# Patient Record
Sex: Male | Born: 1935 | Race: White | Hispanic: No | Marital: Married | Smoking: Former smoker
Health system: Southern US, Community
[De-identification: ages and names within clinical notes are randomized; demographics above are authoritative.]

## PROBLEM LIST (undated history)

## (undated) DIAGNOSIS — I10 Essential (primary) hypertension: Secondary | ICD-10-CM

## (undated) DIAGNOSIS — I480 Paroxysmal atrial fibrillation: Secondary | ICD-10-CM

## (undated) DIAGNOSIS — Z86010 Personal history of colonic polyps: Secondary | ICD-10-CM

## (undated) DIAGNOSIS — F419 Anxiety disorder, unspecified: Secondary | ICD-10-CM

## (undated) DIAGNOSIS — K635 Polyp of colon: Secondary | ICD-10-CM

## (undated) DIAGNOSIS — E785 Hyperlipidemia, unspecified: Secondary | ICD-10-CM

## (undated) DIAGNOSIS — K5790 Diverticulosis of intestine, part unspecified, without perforation or abscess without bleeding: Secondary | ICD-10-CM

## (undated) HISTORY — PX: INGUINAL HERNIA REPAIR: SHX194

## (undated) HISTORY — PX: COLONOSCOPY: SHX174

## (undated) HISTORY — DX: Paroxysmal atrial fibrillation: I48.0

## (undated) HISTORY — DX: Diverticulosis of intestine, part unspecified, without perforation or abscess without bleeding: K57.90

## (undated) HISTORY — DX: Anxiety disorder, unspecified: F41.9

## (undated) HISTORY — DX: Personal history of colonic polyps: Z86.010

## (undated) HISTORY — DX: Hyperlipidemia, unspecified: E78.5

## (undated) HISTORY — DX: Essential (primary) hypertension: I10

## (undated) HISTORY — DX: Polyp of colon: K63.5

---

## 1998-07-31 ENCOUNTER — Ambulatory Visit (HOSPITAL_COMMUNITY): Admission: RE | Admit: 1998-07-31 | Discharge: 1998-07-31 | Payer: Self-pay | Admitting: General Surgery

## 2003-11-11 DIAGNOSIS — Z860100 Personal history of colon polyps, unspecified: Secondary | ICD-10-CM

## 2003-11-11 HISTORY — DX: Personal history of colonic polyps: Z86.010

## 2003-11-11 HISTORY — DX: Personal history of colon polyps, unspecified: Z86.0100

## 2004-01-19 ENCOUNTER — Ambulatory Visit: Payer: Self-pay | Admitting: Internal Medicine

## 2004-07-19 ENCOUNTER — Ambulatory Visit: Payer: Self-pay | Admitting: Internal Medicine

## 2004-10-20 ENCOUNTER — Ambulatory Visit: Payer: Self-pay | Admitting: Internal Medicine

## 2004-11-03 ENCOUNTER — Ambulatory Visit: Payer: Self-pay | Admitting: Internal Medicine

## 2005-04-19 ENCOUNTER — Ambulatory Visit: Payer: Self-pay | Admitting: Internal Medicine

## 2005-06-03 ENCOUNTER — Ambulatory Visit: Payer: Self-pay | Admitting: Internal Medicine

## 2005-06-10 ENCOUNTER — Encounter: Payer: Self-pay | Admitting: Internal Medicine

## 2005-06-10 ENCOUNTER — Ambulatory Visit: Payer: Self-pay

## 2005-06-20 ENCOUNTER — Ambulatory Visit: Payer: Self-pay | Admitting: Internal Medicine

## 2006-01-11 ENCOUNTER — Ambulatory Visit: Payer: Self-pay | Admitting: Internal Medicine

## 2006-02-20 ENCOUNTER — Ambulatory Visit: Payer: Self-pay | Admitting: Internal Medicine

## 2006-03-03 ENCOUNTER — Ambulatory Visit: Payer: Self-pay | Admitting: Internal Medicine

## 2006-03-06 ENCOUNTER — Encounter: Admission: RE | Admit: 2006-03-06 | Discharge: 2006-03-06 | Payer: Self-pay | Admitting: Internal Medicine

## 2006-04-12 ENCOUNTER — Ambulatory Visit: Payer: Self-pay | Admitting: Internal Medicine

## 2006-10-25 ENCOUNTER — Ambulatory Visit: Payer: Self-pay | Admitting: Internal Medicine

## 2006-10-25 DIAGNOSIS — I10 Essential (primary) hypertension: Secondary | ICD-10-CM | POA: Insufficient documentation

## 2006-10-25 DIAGNOSIS — E785 Hyperlipidemia, unspecified: Secondary | ICD-10-CM

## 2006-10-25 DIAGNOSIS — M109 Gout, unspecified: Secondary | ICD-10-CM | POA: Insufficient documentation

## 2007-03-28 ENCOUNTER — Ambulatory Visit: Payer: Self-pay | Admitting: Internal Medicine

## 2007-04-11 ENCOUNTER — Encounter: Payer: Self-pay | Admitting: Internal Medicine

## 2007-04-11 ENCOUNTER — Ambulatory Visit: Payer: Self-pay | Admitting: Internal Medicine

## 2007-05-30 ENCOUNTER — Encounter: Payer: Self-pay | Admitting: Internal Medicine

## 2007-06-12 ENCOUNTER — Ambulatory Visit: Payer: Self-pay | Admitting: Internal Medicine

## 2007-10-16 ENCOUNTER — Ambulatory Visit: Payer: Self-pay | Admitting: Internal Medicine

## 2007-11-27 ENCOUNTER — Ambulatory Visit: Payer: Self-pay | Admitting: Internal Medicine

## 2007-11-27 DIAGNOSIS — F411 Generalized anxiety disorder: Secondary | ICD-10-CM

## 2008-06-17 ENCOUNTER — Ambulatory Visit: Payer: Self-pay | Admitting: Internal Medicine

## 2008-06-26 ENCOUNTER — Encounter: Payer: Self-pay | Admitting: Internal Medicine

## 2008-11-04 ENCOUNTER — Ambulatory Visit: Payer: Self-pay | Admitting: Internal Medicine

## 2008-11-25 ENCOUNTER — Telehealth: Payer: Self-pay | Admitting: Internal Medicine

## 2008-12-31 ENCOUNTER — Ambulatory Visit: Payer: Self-pay | Admitting: Internal Medicine

## 2009-06-19 ENCOUNTER — Encounter: Payer: Self-pay | Admitting: Internal Medicine

## 2009-07-03 ENCOUNTER — Ambulatory Visit: Payer: Self-pay | Admitting: Internal Medicine

## 2009-11-06 ENCOUNTER — Ambulatory Visit: Payer: Self-pay | Admitting: Internal Medicine

## 2010-03-02 NOTE — Assessment & Plan Note (Signed)
Summary: 6 MONTH ROV/NJR/PT [facility] FROM BMP/CJR   Vital Signs:  Patient profile:   75 year old male Height:      70 inches Weight:      208 pounds BMI:     29.95 Temp:     98.6 degrees F oral BP sitting:   140 / 80  (right arm) CC: 6 Mths FU , Hypertension Management   CC:  6 Mths FU  and Hypertension Management.  History of Present Illness:  Follow-Up Visit      This is a 75 year old man who presents for Follow-up visit.  The patient denies chest pain and palpitations.  Since the last visit the patient notes no new problems or concerns.  The patient reports taking meds as prescribed and not monitoring BP.  When questioned about possible medication side effects, the patient notes none.  States he feels well. 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   All other systems reviewed and were negative   Hypertension History:      He denies headache, chest pain, palpitations, dyspnea with exertion, peripheral edema, visual symptoms, neurologic problems, syncope, and side effects from treatment.  He notes no problems with any antihypertensive medication side effects.        Positive major cardiovascular risk factors include male age 73 years old or older, hyperlipidemia, and hypertension.  Negative major cardiovascular risk factors include non-tobacco-user status.     Current Problems (verified): 1)  Anxiety  (ICD-300.00) 2)  Preventive Health Care  (ICD-V70.0) 3)  Colonic Polyps, Adenomatous, Hx of  (ICD-V12.72) 4)  Hypertension  (ICD-401.9) 5)  Hyperlipidemia  (ICD-272.4) 6)  Gout  (ICD-274.9)  Current Medications (verified): 1)  Allopurinol 300 Mg Tabs (Allopurinol) .... Take 1 Tablet By Mouth Once A Day 2)  Atenolol 50 Mg Tabs (Atenolol) .Marland Kitchen.. 1 By Mouth 2 Times Daily 3)  Sertraline Hcl 50 Mg Tabs (Sertraline Hcl) .Marland Kitchen.. 1 By Mouth Daily  Allergies (verified): No Known Drug Allergies  Past  History:  Past Medical History: Last updated: 11/27/2007 Gout Hyperlipidemia Hypertension Hx of adenomatous colon polyps Diverticulosis Colonic polyps, hx of Anxiety  Past Surgical History: Last updated: 10/25/2006 Inguinal herniorrhaphy  Family History: Last updated: 01-Jul-2007 mother deceased oat cell ca lung deceased 61 father deceased melanoma  Social History: Last updated: 01-Jul-2007 Married Former Smoker Alcohol use-yes  Risk Factors: Alcohol Use: 2 (07/01/2007)  Risk Factors: Smoking Status: quit > 6 months (12/31/2008)  Physical Exam  General:  Well-developed,well-nourished,in no acute distress; alert,appropriate and cooperative throughout examination Head:  normocephalic and atraumatic.   Eyes:  pupils equal and pupils round.   Ears:  R ear normal and L ear normal.   Neck:  No deformities, masses, or tenderness noted. Chest Wall:  No deformities, masses, tenderness or gynecomastia noted. Lungs:  Normal respiratory effort, chest expands symmetrically. Lungs are clear to auscultation, no crackles or wheezes. Heart:  normal rate and regular rhythm.   Abdomen:  Bowel sounds positive,abdomen soft and non-tender without masses, organomegaly or hernias noted. Msk:  No deformity or scoliosis noted of thoracic or lumbar spine.   Neurologic:  cranial nerves II-XII intact and gait normal.     Impression & Recommendations:  Problem # 1:  HYPERTENSION (ICD-401.9) repeat BP: 132/76 His updated medication list for this problem includes:    Atenolol 50 Mg Tabs (Atenolol) .Marland Kitchen... 1 by mouth  2 times daily  BP today: 140/80 Prior BP: 116/78 (12/31/2008)  10 Yr Risk Heart Disease: Not enough information  Problem # 2:  HYPERLIPIDEMIA (ICD-272.4)  reviewed outside labs chol: 193 LDL 114  Problem # 3:  GOUT (ICD-274.9) no recurrence continue current medications  His updated medication list for this problem includes:    Allopurinol 300 Mg Tabs (Allopurinol) .Marland Kitchen...  Take 1 tablet by mouth once a day I have encouraged him to take a short nap during the day I don't think his subjective complaint of fatigue warrants further eval at this time  Complete Medication List: 1)  Allopurinol 300 Mg Tabs (Allopurinol) .... Take 1 tablet by mouth once a day 2)  Atenolol 50 Mg Tabs (Atenolol) .Marland Kitchen.. 1 by mouth 2 times daily 3)  Sertraline Hcl 50 Mg Tabs (Sertraline hcl) .Marland Kitchen.. 1 by mouth daily  Hypertension Assessment/Plan:      The patient's hypertensive risk group is category B: At least one risk factor (excluding diabetes) with no target organ damage.  Today's blood pressure is 140/80.

## 2010-03-02 NOTE — Assessment & Plan Note (Signed)
Summary: 4 month rov/njr   Vital Signs:  Patient profile:   75 year old male Weight:      202 pounds Temp:     99.0 degrees F oral BP sitting:   160 / 92  (right arm) Cuff size:   large  Vitals Entered By: Sid Falcon LPN (November 06, 2009 11:51 AM)  Serial Vital Signs/Assessments:  Time      Position  BP       Pulse  Resp  Temp     By                     138/82                         Birdie Sons MD   History of Present Illness:  Follow-Up Visit      This is a 75 year old man who presents for Follow-up visit.  The patient denies chest pain and palpitations.  Since the last visit the patient notes no new problems or concerns.  The patient reports taking meds as prescribed.  When questioned about possible medication side effects, the patient notes none.   allopurinol: tolerating well would like to decrease dose because hard to break 300mg  in 1/2  All other systems reviewed and were negative   Current Problems (verified): 1)  Anxiety  (ICD-300.00) 2)  Preventive Health Care  (ICD-V70.0) 3)  Colonic Polyps, Adenomatous, Hx of  (ICD-V12.72) 4)  Hypertension  (ICD-401.9) 5)  Hyperlipidemia  (ICD-272.4) 6)  Gout  (ICD-274.9)  Current Medications (verified): 1)  Allopurinol 300 Mg Tabs (Allopurinol) .... Take 1/2 Tablet By Mouth Once A Day 2)  Atenolol 50 Mg Tabs (Atenolol) .Marland Kitchen.. 1 By Mouth 2 Times Daily 3)  Sertraline Hcl 50 Mg Tabs (Sertraline Hcl) .Marland Kitchen.. 1 By Mouth Daily  Allergies (verified): No Known Drug Allergies  Past History:  Past Medical History: Last updated: 11/27/2007 Gout Hyperlipidemia Hypertension Hx of adenomatous colon polyps Diverticulosis Colonic polyps, hx of Anxiety  Past Surgical History: Last updated: 10/25/2006 Inguinal herniorrhaphy  Family History: Last updated: 26-Jun-2007 mother deceased oat cell ca lung deceased 52 father deceased melanoma  Social History: Last updated: 2007/06/26 Married Former Smoker Alcohol use-yes  Risk  Factors: Alcohol Use: 2 (2007/06/26)  Risk Factors: Smoking Status: quit > 6 months (12/31/2008)  Physical Exam  General:  alert and well-developed.   Head:  normocephalic and atraumatic.   Eyes:  pupils equal and pupils round.   Ears:  R ear normal and L ear normal.   Neck:  No deformities, masses, or tenderness noted. Chest Wall:  No deformities, masses, tenderness or gynecomastia noted. Lungs:  no intercostal retractions and no accessory muscle use.   Heart:  normal rate and regular rhythm.   Msk:  No deformity or scoliosis noted of thoracic or lumbar spine.   Neurologic:  cranial nerves II-XII intact and gait normal.   Skin:  turgor normal and color normal.   Psych:  normally interactive and good eye contact.     Impression & Recommendations:  Problem # 1:  HYPERLIPIDEMIA (ICD-272.4)  reviewed labs from [facility]  Problem # 2:  HYPERTENSION (ICD-401.9) he will follow at home His updated medication list for this problem includes:    Atenolol 50 Mg Tabs (Atenolol) .Marland Kitchen... 1 by mouth 2 times daily  BP today: 160/92 Prior BP: 140/80 (07/03/2009)  Prior 10 Yr Risk Heart Disease: Not enough information (07/03/2009)  Problem # 3:  GOUT (ICD-274.9) decrease dose call for increased sxs The following medications were removed from the medication list:    Allopurinol 300 Mg Tabs (Allopurinol) .Marland Kitchen... Take 1/2 tablet by mouth once a day His updated medication list for this problem includes:    Allopurinol 100 Mg Tabs (Allopurinol) .Marland Kitchen... 1 by mouth daily  Complete Medication List: 1)  Atenolol 50 Mg Tabs (Atenolol) .Marland Kitchen.. 1 by mouth 2 times daily 2)  Sertraline Hcl 50 Mg Tabs (Sertraline hcl) .Marland Kitchen.. 1 by mouth daily 3)  Allopurinol 100 Mg Tabs (Allopurinol) .Marland Kitchen.. 1 by mouth daily  Other Orders: Flu Vaccine 37yrs + MEDICARE PATIENTS (O9629) Administration Flu vaccine - MCR (B2841)  Patient Instructions: 1)  Please schedule a follow-up appointment in 6 months. Prescriptions: SERTRALINE  HCL 50 MG TABS (SERTRALINE HCL) 1 by mouth daily  #90 Tablet x 3   Entered and Authorized by:   Birdie Sons MD   Signed by:   Birdie Sons MD on 11/06/2009   Method used:   Electronically to        [facility]* (retail)       [address] [place], [place]  [address]       Ph: [phone]       Fax: [phone]   RxID:   [id number] ATENOLOL 50 MG TABS (ATENOLOL) 1 by mouth 2 times daily  #180 x 3   Entered and Authorized by:   Birdie Sons MD   Signed by:   Birdie Sons MD on 11/06/2009   Method used:   Electronically to        [facility]* (retail)       [address] [place], [place]  [address]       Ph: [phone]       Fax: [phone]   RxID:   [id number] ALLOPURINOL 100 MG TABS (ALLOPURINOL) 1 by mouth daily  #90 x 3   Entered and Authorized by:   Birdie Sons MD   Signed by:   Birdie Sons MD on 11/06/2009   Method used:   Electronically to        [facility]* (retail)       [address] [place], [place]  [address]       Ph: [phone]       Fax: [phone]   RxID:   [id number]   Flu Vaccine Consent Questions     Do you have a history of severe allergic reactions to this vaccine? no    Any prior history of allergic reactions to egg and/or gelatin? no    Do you have a sensitivity to the preservative Thimersol? no    Do you have a past history of Guillan-Barre Syndrome? no    Do you currently have an acute febrile illness? no    Have you ever had a severe reaction to latex? no    Vaccine information given and explained to patient? yes    Are you currently pregnant? no    Lot Number:[id number]   Exp Date:07/31/2010   Site Given  Left Deltoid IMdflu

## 2010-04-20 ENCOUNTER — Encounter: Payer: Self-pay | Admitting: Internal Medicine

## 2010-04-29 NOTE — Letter (Signed)
Summary: Colonoscopy Letter  [facility]  [address]   [place], [place] [address]   Phone: [phone]  Fax: [phone]      April 20, 2010 MRN: [id number]   Travis Pruitt [address] [place], [place]  [address]   Dear [name],   According to your medical record, it is time for you to schedule a Colonoscopy. The American Cancer Society recommends this procedure as a method to detect early colon cancer. Patients with a family history of colon cancer, or a personal history of colon polyps or inflammatory bowel disease are at increased risk.  This letter has been generated based on the recommendations made at the time of your procedure. If you feel that in your particular situation this may no longer apply, please contact our office.  Please call our office at [phone] to schedule this appointment or to update your records at your earliest convenience.  Thank you for cooperating with [place] to provide you with the very best care possible.   Sincerely,   Stan Head, M.D.  [facility] Gastroenterology [facility] [phone]

## 2010-05-20 ENCOUNTER — Other Ambulatory Visit: Payer: Self-pay | Admitting: Dermatology

## 2010-06-01 ENCOUNTER — Ambulatory Visit: Payer: Self-pay | Admitting: Internal Medicine

## 2010-06-15 NOTE — Assessment & Plan Note (Signed)
[facility]                         GASTROENTEROLOGY OFFICE NOTE   MASATO, PETTIE                 MRN:          [id number]  DATE:03/28/2007                            DOB:          13-Apr-1935    CHIEF COMPLAINT:  Change in bowels.   Mr. Cotta has been a once-a-day bowel movement person for years.  Over the past several months, it has gone to two, sometimes three a day,  smaller amounts, sometimes loose.  No bleeding reported.  No fever or  chills or other constitutional symptoms.  No medications, no diet  change.  Occasionally, his hemorrhoids still protrude and bother him.   PAST MEDICAL HISTORY:  Adenomatous colon polyps, October 2005, with  diverticulosis.  External hemorrhoids diagnosed in 2007.  Hypertension,  gout.   MEDICATIONS:  1. Allopurinol 300 mg daily.  2. Lisinopril 25 mg daily.   ALLERGIES:  There are no known drug allergies.   PHYSICAL EXAM:  Weight 213 pounds, pulse 60, blood pressure 140/80.   SOCIAL HISTORY:  He is not a smoker.  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   ASSESSMENT:  Change in bowels.  Could represent new colorectal  neoplasia.  He has a personal history of adenomatous polyps, as well.   PLAN:  Schedule colonoscopy to investigate the change in bowel habits,  keeping in mind the history of adenomatous polyps.     Iva Boop, MD,FACG  Electronically Signed    CEG/MedQ  DD: 03/28/2007  DT: 03/28/2007  Job #: [id number]   cc:   Valetta Mole. Swords, MD

## 2010-06-18 NOTE — Assessment & Plan Note (Signed)
[facility]                         GASTROENTEROLOGY OFFICE NOTE   LEIGH, Travis Pruitt                 MRN:          [id number]  DATE:01/11/2006                            DOB:          03-Oct-1935    CHIEF COMPLAINT:  Question hemorrhoids.   REQUESTING PHYSICIAN:  Dr. Birdie Sons.   ASSESSMENT:  Small external hemorrhoids versus protruding internal  hemorrhoids.  These are seen on physical exam.   PLAN:  Supportive care, hemorrhoid handout, preparation H as needed.  He  is reassured.  He was concerned that this represented something more  serious.  He is due for a surveillance colonoscopy for a small  adenomatous polyp, but not until 2010.  See me as needed otherwise.   HISTORY:  Mr. Douthat has noted some protrusion at the rectum as he  is washing around his anal area in the shower.  He is concerned about  this.  There is no bleeding or defecation change.   PAST MEDICAL HISTORY:  Colonoscopy with tubular adenoma x2, 3 mm, 5 mm,  November 26, 2003, as well as diverticulosis.  At that time, I did not  really notice any obvious hemorrhoids.  He also has hypertension.  Full  details of his medical history otherwise outlined in my chart.   PHYSICAL:  Weight 206 pounds, height 5 feet 10 inches, blood pressure  120/88, pulse 60.  Inspection of the rectal area shows 2 small protruding violaceous  hemorrhoids.  They are very small, though.  Not thrombosed.  They are  nontender.  Rectal exam shows no mass, no significant stool.  Prostate  is normal.   Note, I advised him on some anal care as well to try to avoid excessive  wiping, and perhaps use wet wipes.  His medications are listed and  reviewed in the chart.     Iva Boop, MD,FACG  Electronically Signed    CEG/MedQ  DD: 01/11/2006  DT: 01/11/2006  Job #: [id number]   cc:   Valetta Mole. Swords, MD

## 2010-07-05 ENCOUNTER — Encounter: Payer: Self-pay | Admitting: Internal Medicine

## 2010-07-06 ENCOUNTER — Ambulatory Visit: Payer: Self-pay | Admitting: Internal Medicine

## 2010-07-16 ENCOUNTER — Ambulatory Visit: Payer: Self-pay | Admitting: Internal Medicine

## 2010-07-20 ENCOUNTER — Ambulatory Visit: Payer: Self-pay | Admitting: Internal Medicine

## 2010-07-22 ENCOUNTER — Ambulatory Visit (INDEPENDENT_AMBULATORY_CARE_PROVIDER_SITE_OTHER): Payer: 59 | Admitting: Internal Medicine

## 2010-07-22 ENCOUNTER — Encounter: Payer: Self-pay | Admitting: Internal Medicine

## 2010-07-22 DIAGNOSIS — I10 Essential (primary) hypertension: Secondary | ICD-10-CM

## 2010-07-22 DIAGNOSIS — F411 Generalized anxiety disorder: Secondary | ICD-10-CM

## 2010-07-22 DIAGNOSIS — M109 Gout, unspecified: Secondary | ICD-10-CM

## 2010-07-22 DIAGNOSIS — E785 Hyperlipidemia, unspecified: Secondary | ICD-10-CM

## 2010-07-22 NOTE — Assessment & Plan Note (Addendum)
Needs f/u- Reviewed labs from [facility] medical

## 2010-07-22 NOTE — Assessment & Plan Note (Signed)
He is only taking 25 mg of zoloft---ok to stop

## 2010-07-22 NOTE — Assessment & Plan Note (Addendum)
Repeat bp is adequate Continue same meds

## 2010-07-22 NOTE — Assessment & Plan Note (Signed)
No recurrence  Continue meds Reviewed labs from dr Dareen Piano

## 2010-08-01 NOTE — Progress Notes (Signed)
  Subjective:    Patient ID: Travis Pruitt, male    DOB: 12-15-1935, 75 y.o.   MRN: [id number]  HPI Patient Active Problem List  Diagnoses  . HYPERLIPIDEMIA--currently not on any medications.   Marland Kitchen GOUT--no recurrent symptoms.   . ANXIETY--- patient with a presumed chronic anxiety disorder. He does not desire treatment at this time.   Marland Kitchen HYPERTENSION--- no home blood pressures. He is tolerating atenolol.   . COLONIC POLYPS, ADENOMATOUS, HX OF    Past Medical History  Diagnosis Date  . Gout   . Hyperlipidemia   . Hypertension   . Anxiety   . Diverticulosis   . Colon polyp    Past Surgical History  Procedure Date  . Inguinal hernia repair     reports that he quit smoking about 40 years ago. He does not have any smokeless tobacco history on file. He reports that he drinks alcohol. His drug history not on file. family history includes Cancer in his mother. No Known Allergies     Review of Systems patient denies chest pain, shortness of breath, orthopnea. Denies lower extremity edema, abdominal pain, change in appetite, change in bowel movements. Patient denies rashes, musculoskeletal complaints. No other specific complaints in a complete review of systems.       Objective:   Physical Exam  well-developed well-nourished male in no acute distress. HEENT exam atraumatic, normocephalic, neck supple without jugular venous distention. Chest clear to auscultation cardiac exam S1-S2 are regular. Abdominal exam overweight with bowel sounds, soft and nontender. Extremities no edema. Neurologic exam is alert with a normal gait.        Assessment & Plan:

## 2010-11-12 ENCOUNTER — Ambulatory Visit: Payer: 59

## 2010-11-27 ENCOUNTER — Other Ambulatory Visit: Payer: Self-pay | Admitting: Internal Medicine

## 2010-12-31 ENCOUNTER — Ambulatory Visit: Payer: 59 | Admitting: Internal Medicine

## 2011-01-05 ENCOUNTER — Ambulatory Visit (INDEPENDENT_AMBULATORY_CARE_PROVIDER_SITE_OTHER): Payer: 59 | Admitting: Internal Medicine

## 2011-01-05 ENCOUNTER — Encounter: Payer: Self-pay | Admitting: Internal Medicine

## 2011-01-05 VITALS — BP 128/84 | HR 60 | Temp 98.3°F | Ht 70.0 in | Wt 203.0 lb

## 2011-01-05 DIAGNOSIS — Z23 Encounter for immunization: Secondary | ICD-10-CM

## 2011-01-05 DIAGNOSIS — I10 Essential (primary) hypertension: Secondary | ICD-10-CM

## 2011-01-05 DIAGNOSIS — Z0000 Encounter for general adult medical examination without abnormal findings: Secondary | ICD-10-CM

## 2011-01-05 LAB — LIPID PANEL
Cholesterol: 185 mg/dL (ref 0–200)
HDL: 45.8 mg/dL (ref >39.00)
LDL Cholesterol: 109 mg/dL — ABNORMAL HIGH (ref 0–99)
Total CHOL/HDL Ratio: 4
Triglycerides: 149 mg/dL (ref 0.0–149.0)
VLDL: 29.8 mg/dL (ref 0.0–40.0)

## 2011-01-05 LAB — CBC WITH DIFFERENTIAL/PLATELET
Basophils Absolute: 0 10*3/uL (ref 0.0–0.1)
Basophils Relative: 0.2 % (ref 0.0–3.0)
Eosinophils Absolute: 0.1 10*3/uL (ref 0.0–0.7)
Lymphs Abs: 1.6 10*3/uL (ref 0.7–4.0)
MCV: 95 fl (ref 78.0–100.0)
Neutro Abs: 6.1 10*3/uL (ref 1.4–7.7)
Neutrophils Relative %: 72.6 % (ref 43.0–77.0)
RDW: 13.2 % (ref 11.5–14.6)
WBC: 8.5 10*3/uL (ref 4.5–10.5)

## 2011-01-05 LAB — HEPATIC FUNCTION PANEL
AST: 17 U/L (ref 0–37)
Albumin: 3.8 g/dL (ref 3.5–5.2)
Alkaline Phosphatase: 49 U/L (ref 39–117)
Total Bilirubin: 0.7 mg/dL (ref 0.3–1.2)
Total Protein: 6.6 g/dL (ref 6.0–8.3)

## 2011-01-05 LAB — BASIC METABOLIC PANEL
Calcium: 8.9 mg/dL (ref 8.4–10.5)
Chloride: 104 mEq/L (ref 96–112)
Potassium: 4.5 mEq/L (ref 3.5–5.1)

## 2011-01-05 MED ORDER — SERTRALINE HCL 50 MG PO TABS: 50.0000 mg | ORAL_TABLET | Freq: Every day | ORAL | Status: DC

## 2011-01-05 NOTE — Assessment & Plan Note (Signed)
BP Readings from Last 3 Encounters:  01/05/11 128/84  07/22/10 132/84  11/06/09 160/92   Reasonable control Continue same meds

## 2011-01-05 NOTE — Progress Notes (Signed)
Patient ID: Travis Pruitt, male   DOB: 1935/10/22, 75 y.o.   MRN: [id number] cpx  Past Medical History  Diagnosis Date  . Gout   . Hyperlipidemia   . Hypertension   . Anxiety   . Diverticulosis   . Colon polyp     History   Social History  . Marital Status: Married    Spouse Name: N/A    Number of Children: N/A  . Years of Education: N/A   Occupational History  . Not on file.   Social History Main Topics  . Smoking status: Former Smoker    Quit date: 07/22/1970  . Smokeless tobacco: Not on file  . Alcohol Use: Yes  . Drug Use: Not on file  . Sexually Active: Not on file   Other Topics Concern  . Not on file   Social History Narrative  . No narrative on file    Past Surgical History  Procedure Date  . Inguinal hernia repair     Family History  Problem Relation Age of Onset  . Cancer Mother     lung    No Known Allergies  Current Outpatient Prescriptions on File Prior to Visit  Medication Sig Dispense Refill  . allopurinol (ZYLOPRIM) 100 MG tablet TAKE 1 TABLET BY MOUTH DAILY  90 tablet  2  . atenolol (TENORMIN) 50 MG tablet TAKE 1 TABLET BY MOUTH TWICE DAILY  180 tablet  2     patient denies chest pain, shortness of breath, orthopnea. Denies lower extremity edema, abdominal pain, change in appetite, change in bowel movements. Patient denies rashes, musculoskeletal complaints. No other specific complaints in a complete review of systems.   BP 142/90  Pulse 60  Temp(Src) 98.3 F (36.8 C) (Oral)  Ht 5\' 10"  (1.778 m)  Wt 203 lb (92.08 kg)  BMI 29.13 kg/m2  well-developed well-nourished male in no acute distress. HEENT exam atraumatic, normocephalic, neck supple without jugular venous distention. Chest clear to auscultation cardiac exam S1-S2 are regular. Abdominal exam overweight with bowel sounds, soft and nontender. Extremities no edema. Neurologic exam is alert with a normal gait.  A/P--- Well visit--health maint UTD

## 2011-01-07 ENCOUNTER — Ambulatory Visit: Payer: 59 | Admitting: Internal Medicine

## 2011-02-14 ENCOUNTER — Other Ambulatory Visit: Payer: Self-pay | Admitting: Dermatology

## 2011-03-15 ENCOUNTER — Other Ambulatory Visit: Payer: Self-pay | Admitting: Dermatology

## 2011-10-19 ENCOUNTER — Encounter: Payer: Self-pay | Admitting: Internal Medicine

## 2011-12-17 ENCOUNTER — Other Ambulatory Visit: Payer: Self-pay | Admitting: Internal Medicine

## 2011-12-20 ENCOUNTER — Other Ambulatory Visit: Payer: Self-pay | Admitting: *Deleted

## 2011-12-20 MED ORDER — ALLOPURINOL 100 MG PO TABS: 100.0000 mg | ORAL_TABLET | Freq: Every day | ORAL | Status: DC

## 2011-12-20 MED ORDER — ATENOLOL 50 MG PO TABS: 50.0000 mg | ORAL_TABLET | Freq: Two times a day (BID) | ORAL | Status: DC

## 2012-06-04 ENCOUNTER — Telehealth: Payer: Self-pay | Admitting: Internal Medicine

## 2012-06-04 MED ORDER — ATENOLOL 50 MG PO TABS: 50.0000 mg | ORAL_TABLET | Freq: Two times a day (BID) | ORAL | Status: DC

## 2012-06-04 NOTE — Telephone Encounter (Signed)
Pt has appt 08/13/12.  90 days rx sent in electronically

## 2012-06-04 NOTE — Telephone Encounter (Signed)
Patient called stating that he need a refill of his atenolol 50 mg 1pobid appt scheduled [facility]. Please assist.

## 2012-06-05 ENCOUNTER — Other Ambulatory Visit (INDEPENDENT_AMBULATORY_CARE_PROVIDER_SITE_OTHER): Payer: 59

## 2012-06-05 DIAGNOSIS — Z0000 Encounter for general adult medical examination without abnormal findings: Secondary | ICD-10-CM

## 2012-06-05 LAB — BASIC METABOLIC PANEL
CO2: 28 mEq/L (ref 19–32)
Chloride: 102 mEq/L (ref 96–112)
Creatinine, Ser: 1.1 mg/dL (ref 0.4–1.5)
Glucose, Bld: 85 mg/dL (ref 70–99)
Sodium: 137 mEq/L (ref 135–145)

## 2012-06-05 LAB — LIPID PANEL
LDL Cholesterol: 114 mg/dL — ABNORMAL HIGH (ref 0–99)
Total CHOL/HDL Ratio: 4
Triglycerides: 151 mg/dL — ABNORMAL HIGH (ref 0.0–149.0)
VLDL: 30.2 mg/dL (ref 0.0–40.0)

## 2012-06-05 LAB — PSA: PSA: 0.22 ng/mL (ref 0.10–4.00)

## 2012-06-05 LAB — CBC WITH DIFFERENTIAL/PLATELET
Basophils Absolute: 0 10*3/uL (ref 0.0–0.1)
Hemoglobin: 17.3 g/dL — ABNORMAL HIGH (ref 13.0–17.0)
Lymphs Abs: 1.7 10*3/uL (ref 0.7–4.0)
Neutro Abs: 4.3 10*3/uL (ref 1.4–7.7)
Neutrophils Relative %: 64.4 % (ref 43.0–77.0)
Platelets: 176 10*3/uL (ref 150.0–400.0)
RDW: 13.4 % (ref 11.5–14.6)

## 2012-06-07 LAB — POCT URINALYSIS DIPSTICK
Ketones, UA: NEGATIVE
Leukocytes, UA: NEGATIVE
Urobilinogen, UA: 0.2
pH, UA: 7

## 2012-06-11 ENCOUNTER — Other Ambulatory Visit: Payer: Self-pay | Admitting: Dermatology

## 2012-07-02 ENCOUNTER — Other Ambulatory Visit: Payer: 59

## 2012-07-09 ENCOUNTER — Encounter: Payer: 59 | Admitting: Internal Medicine

## 2012-08-13 ENCOUNTER — Ambulatory Visit (INDEPENDENT_AMBULATORY_CARE_PROVIDER_SITE_OTHER): Payer: 59 | Admitting: Internal Medicine

## 2012-08-13 ENCOUNTER — Encounter: Payer: Self-pay | Admitting: Internal Medicine

## 2012-08-13 VITALS — BP 142/88 | HR 68 | Temp 98.3°F | Ht 70.25 in | Wt 204.5 lb

## 2012-08-13 DIAGNOSIS — Z0000 Encounter for general adult medical examination without abnormal findings: Secondary | ICD-10-CM

## 2012-08-13 MED ORDER — ALLOPURINOL 100 MG PO TABS: 100.0000 mg | ORAL_TABLET | Freq: Every day | ORAL | Status: DC

## 2012-08-13 NOTE — Progress Notes (Signed)
cpx  Past Medical History  Diagnosis Date  . Gout   . Hyperlipidemia   . Hypertension   . Anxiety   . Diverticulosis   . Colon polyp     History   Social History  . Marital Status: Married    Spouse Name: N/A    Number of Children: N/A  . Years of Education: N/A   Occupational History  . Not on file.   Social History Main Topics  . Smoking status: Former Smoker    Quit date: 07/22/1970  . Smokeless tobacco: Not on file  . Alcohol Use: Yes  . Drug Use: Not on file  . Sexually Active: Not on file   Other Topics Concern  . Not on file   Social History Narrative  . No narrative on file    Past Surgical History  Procedure Laterality Date  . Inguinal hernia repair      Family History  Problem Relation Age of Onset  . Cancer Mother     lung    No Known Allergies  Current Outpatient Prescriptions on File Prior to Visit  Medication Sig Dispense Refill  . allopurinol (ZYLOPRIM) 100 MG tablet Take 1 tablet (100 mg total) by mouth daily. NEEDS OV  30 tablet  0  . atenolol (TENORMIN) 50 MG tablet Take 1 tablet (50 mg total) by mouth 2 (two) times daily. NEEDS OV  180 tablet  0  . sertraline (ZOLOFT) 50 MG tablet Take 1 tablet (50 mg total) by mouth daily. Or as directed  90 tablet  2   No current facility-administered medications on file prior to visit.     patient denies chest pain, shortness of breath, orthopnea. Denies lower extremity edema, abdominal pain, change in appetite, change in bowel movements. Patient denies rashes, musculoskeletal complaints. No other specific complaints in a complete review of systems.   Reviewed vitals  well-developed well-nourished male in no acute distress. HEENT exam atraumatic, normocephalic, neck supple without jugular venous distention. Chest clear to auscultation cardiac exam S1-S2 are regular. Abdominal exam overweight with bowel sounds, soft and nontender. Extremities no edema. Neurologic exam is alert with a normal  gait.  Well Visit- health maint UTD Reviewed labs from May

## 2012-08-21 ENCOUNTER — Other Ambulatory Visit: Payer: Self-pay | Admitting: *Deleted

## 2012-08-21 MED ORDER — ATENOLOL 50 MG PO TABS: 50.0000 mg | ORAL_TABLET | Freq: Two times a day (BID) | ORAL | Status: DC

## 2012-11-07 ENCOUNTER — Ambulatory Visit (INDEPENDENT_AMBULATORY_CARE_PROVIDER_SITE_OTHER): Payer: 59

## 2012-11-07 DIAGNOSIS — Z23 Encounter for immunization: Secondary | ICD-10-CM

## 2013-06-10 ENCOUNTER — Other Ambulatory Visit: Payer: Self-pay | Admitting: Dermatology

## 2013-07-30 ENCOUNTER — Telehealth: Payer: Self-pay | Admitting: Internal Medicine

## 2013-07-30 MED ORDER — ATENOLOL 50 MG PO TABS: 50.0000 mg | ORAL_TABLET | Freq: Two times a day (BID) | ORAL | Status: DC

## 2013-07-30 NOTE — Telephone Encounter (Signed)
rx sent in electronically, needs ov before anymore refilled

## 2013-07-30 NOTE — Telephone Encounter (Signed)
Pt is needing new rx atenolol (TENORMIN) 50 MG tablet, send to [facility]

## 2013-08-01 ENCOUNTER — Telehealth: Payer: Self-pay | Admitting: Internal Medicine

## 2013-08-01 MED ORDER — ATENOLOL 50 MG PO TABS: 50.0000 mg | ORAL_TABLET | Freq: Two times a day (BID) | ORAL | Status: DC

## 2013-08-01 NOTE — Telephone Encounter (Signed)
Pt request refill atenolol (TENORMIN) 50 MG tablet  90 day Pt had fup appt [facility] was cancelled by the dr [facility]

## 2013-08-01 NOTE — Telephone Encounter (Signed)
rx sent to pharmacy

## 2013-08-07 ENCOUNTER — Other Ambulatory Visit (INDEPENDENT_AMBULATORY_CARE_PROVIDER_SITE_OTHER): Payer: 59

## 2013-08-07 DIAGNOSIS — Z0000 Encounter for general adult medical examination without abnormal findings: Secondary | ICD-10-CM

## 2013-08-07 LAB — POCT URINALYSIS DIPSTICK
BILIRUBIN UA: NEGATIVE
GLUCOSE UA: NEGATIVE
KETONES UA: NEGATIVE
LEUKOCYTES UA: NEGATIVE
NITRITE UA: NEGATIVE
PROTEIN UA: NEGATIVE
Spec Grav, UA: 1.02
Urobilinogen, UA: 0.2
pH, UA: 6

## 2013-08-07 LAB — BASIC METABOLIC PANEL
BUN: 22 mg/dL (ref 6–23)
CO2: 28 mEq/L (ref 19–32)
CREATININE: 0.9 mg/dL (ref 0.4–1.5)
Calcium: 9.1 mg/dL (ref 8.4–10.5)
Chloride: 104 mEq/L (ref 96–112)
GFR: 84.06 mL/min (ref >60.00)
Glucose, Bld: 92 mg/dL (ref 70–99)
Potassium: 4.3 mEq/L (ref 3.5–5.1)
Sodium: 139 mEq/L (ref 135–145)

## 2013-08-07 LAB — LIPID PANEL
Cholesterol: 195 mg/dL (ref 0–200)
HDL: 44.2 mg/dL (ref >39.00)
LDL CALC: 116 mg/dL — AB (ref 0–99)
NonHDL: 150.8
TRIGLYCERIDES: 175 mg/dL — AB (ref 0.0–149.0)
Total CHOL/HDL Ratio: 4
VLDL: 35 mg/dL (ref 0.0–40.0)

## 2013-08-07 LAB — CBC WITH DIFFERENTIAL/PLATELET
BASOS ABS: 0 10*3/uL (ref 0.0–0.1)
BASOS PCT: 0.4 % (ref 0.0–3.0)
EOS ABS: 0.2 10*3/uL (ref 0.0–0.7)
EOS PCT: 2.8 % (ref 0.0–5.0)
HCT: 49.3 % (ref 39.0–52.0)
Hemoglobin: 16.9 g/dL (ref 13.0–17.0)
LYMPHS ABS: 1.6 10*3/uL (ref 0.7–4.0)
LYMPHS PCT: 27.6 % (ref 12.0–46.0)
MCHC: 34.2 g/dL (ref 30.0–36.0)
MCV: 93.9 fl (ref 78.0–100.0)
Monocytes Absolute: 0.5 10*3/uL (ref 0.1–1.0)
Monocytes Relative: 9.1 % (ref 3.0–12.0)
Neutro Abs: 3.5 10*3/uL (ref 1.4–7.7)
Neutrophils Relative %: 60.1 % (ref 43.0–77.0)
Platelets: 177 10*3/uL (ref 150.0–400.0)
RBC: 5.25 Mil/uL (ref 4.22–5.81)
RDW: 13.3 % (ref 11.5–15.5)
WBC: 5.8 10*3/uL (ref 4.0–10.5)

## 2013-08-07 LAB — HEPATIC FUNCTION PANEL
ALT: 15 U/L (ref 0–53)
AST: 14 U/L (ref 0–37)
Albumin: 3.7 g/dL (ref 3.5–5.2)
Alkaline Phosphatase: 50 U/L (ref 39–117)
BILIRUBIN DIRECT: 0.2 mg/dL (ref 0.0–0.3)
BILIRUBIN TOTAL: 0.8 mg/dL (ref 0.2–1.2)
Total Protein: 6.1 g/dL (ref 6.0–8.3)

## 2013-08-07 LAB — PSA: PSA: 0.26 ng/mL (ref 0.10–4.00)

## 2013-08-07 LAB — TSH: TSH: 1.12 u[IU]/mL (ref 0.35–4.50)

## 2013-08-13 ENCOUNTER — Other Ambulatory Visit: Payer: 59

## 2013-08-14 ENCOUNTER — Ambulatory Visit (INDEPENDENT_AMBULATORY_CARE_PROVIDER_SITE_OTHER): Payer: 59 | Admitting: Physician Assistant

## 2013-08-14 ENCOUNTER — Encounter: Payer: 59 | Admitting: Internal Medicine

## 2013-08-14 ENCOUNTER — Encounter: Payer: Self-pay | Admitting: Physician Assistant

## 2013-08-14 VITALS — BP 128/80 | HR 60 | Temp 98.0°F | Resp 18 | Ht 69.5 in | Wt 205.0 lb

## 2013-08-14 DIAGNOSIS — Z0000 Encounter for general adult medical examination without abnormal findings: Secondary | ICD-10-CM

## 2013-08-14 NOTE — Patient Instructions (Signed)
You are in very good health.   Continued healthy diet and regular exercise can help bring down your cholesterol.  Return as needed for all other concerns.   Health Maintenance A healthy lifestyle and preventative care can promote health and wellness.  Maintain regular health, dental, and eye exams.  Eat a healthy diet. Foods like vegetables, fruits, whole grains, low-fat dairy products, and lean protein foods contain the nutrients you need and are low in calories. Decrease your intake of foods high in solid fats, added sugars, and salt. Get information about a proper diet from your health care provider, if necessary.  Regular physical exercise is one of the most important things you can do for your health. Most adults should get at least 150 minutes of moderate-intensity exercise (any activity that increases your heart rate and causes you to sweat) each week. In addition, most adults need muscle-strengthening exercises on 2 or more days a week.   Maintain a healthy weight. The body mass index (BMI) is a screening tool to identify possible weight problems. It provides an estimate of body fat based on height and weight. Your health care provider can find your BMI and can help you achieve or maintain a healthy weight. For males 20 years and older:  A BMI below 18.5 is considered underweight.  A BMI of 18.5 to 24.9 is normal.  A BMI of 25 to 29.9 is considered overweight.  A BMI of 30 and above is considered obese.  Maintain normal blood lipids and cholesterol by exercising and minimizing your intake of saturated fat. Eat a balanced diet with plenty of fruits and vegetables. Blood tests for lipids and cholesterol should begin at age 34 and be repeated every 5 years. If your lipid or cholesterol levels are high, you are over age 66, or you are at high risk for heart disease, you may need your cholesterol levels checked more frequently.Ongoing high lipid and cholesterol levels should be treated  with medicines if diet and exercise are not working.  If you smoke, find out from your health care provider how to quit. If you do not use tobacco, do not start.  Lung cancer screening is recommended for adults aged 47-80 years who are at high risk for developing lung cancer because of a history of smoking. A yearly low-dose CT scan of the lungs is recommended for people who have at least a 30-pack-year history of smoking and are current smokers or have quit within the past 15 years. A pack year of smoking is smoking an average of 1 pack of cigarettes a day for 1 year (for example, a 30-pack-year history of smoking could mean smoking 1 pack a day for 30 years or 2 packs a day for 15 years). Yearly screening should continue until the smoker has stopped smoking for at least 15 years. Yearly screening should be stopped for people who develop a health problem that would prevent them from having lung cancer treatment.  If you choose to drink alcohol, do not have more than 2 drinks per day. One drink is considered to be 12 oz (360 mL) of beer, 5 oz (150 mL) of wine, or 1.5 oz (45 mL) of liquor.  Avoid the use of street drugs. Do not share needles with anyone. Ask for help if you need support or instructions about stopping the use of drugs.  High blood pressure causes heart disease and increases the risk of stroke. Blood pressure should be checked at least every 1-2 years.  Ongoing high blood pressure should be treated with medicines if weight loss and exercise are not effective.  If you are 72-64 years old, ask your health care provider if you should take aspirin to prevent heart disease.  Diabetes screening involves taking a blood sample to check your fasting blood sugar level. This should be done once every 3 years after age 36 if you are at a normal weight and without risk factors for diabetes. Testing should be considered at a younger age or be carried out more frequently if you are overweight and have at  least 1 risk factor for diabetes.  Colorectal cancer can be detected and often prevented. Most routine colorectal cancer screening begins at the age of 42 and continues through age 86. However, your health care provider may recommend screening at an earlier age if you have risk factors for colon cancer. On a yearly basis, your health care provider may provide home test kits to check for hidden blood in the stool. A small camera at the end of a tube may be used to directly examine the colon (sigmoidoscopy or colonoscopy) to detect the earliest forms of colorectal cancer. Talk to your health care provider about this at age 49 when routine screening begins. A direct exam of the colon should be repeated every 5-10 years through age 25, unless early forms of precancerous polyps or small growths are found.  People who are at an increased risk for hepatitis B should be screened for this virus. You are considered at high risk for hepatitis B if:  You were born in a country where hepatitis B occurs often. Talk with your health care provider about which countries are considered high risk.  Your parents were born in a high-risk country and you have not received a shot to protect against hepatitis B (hepatitis B vaccine).  You have HIV or AIDS.  You use needles to inject street drugs.  You live with, or have sex with, someone who has hepatitis B.  You are a man who has sex with other men (MSM).  You get hemodialysis treatment.  You take certain medicines for conditions like cancer, organ transplantation, and autoimmune conditions.  Hepatitis C blood testing is recommended for all people born from [birth year] through 1963 and any individual with known risk factors for hepatitis C.  Healthy men should no longer receive prostate-specific antigen (PSA) blood tests as part of routine cancer screening. Talk to your health care provider about prostate cancer screening.  Testicular cancer screening is not recommended  for adolescents or adult males who have no symptoms. Screening includes self-exam, a health care provider exam, and other screening tests. Consult with your health care provider about any symptoms you have or any concerns you have about testicular cancer.  Practice safe sex. Use condoms and avoid high-risk sexual practices to reduce the spread of sexually transmitted infections (STIs).  You should be screened for STIs, including gonorrhea and chlamydia if:  You are sexually active and are younger than 24 years.  You are older than 24 years, and your health care provider tells you that you are at risk for this type of infection.  Your sexual activity has changed since you were last screened, and you are at an increased risk for chlamydia or gonorrhea. Ask your health care provider if you are at risk.  If you are at risk of being infected with HIV, it is recommended that you take a prescription medicine daily to prevent HIV infection. This  is called pre-exposure prophylaxis (PrEP). You are considered at risk if:  You are a man who has sex with other men (MSM).  You are a heterosexual man who is sexually active with multiple partners.  You take drugs by injection.  You are sexually active with a partner who has HIV.  Talk with your health care provider about whether you are at high risk of being infected with HIV. If you choose to begin PrEP, you should first be tested for HIV. You should then be tested every 3 months for as long as you are taking PrEP.  Use sunscreen. Apply sunscreen liberally and repeatedly throughout the day. You should seek shade when your shadow is shorter than you. Protect yourself by wearing long sleeves, pants, a wide-brimmed hat, and sunglasses year round whenever you are outdoors.  Tell your health care provider of new moles or changes in moles, especially if there is a change in shape or color. Also, tell your health care provider if a mole is larger than the size  of a pencil eraser.  A one-time screening for abdominal aortic aneurysm (AAA) and surgical repair of large AAAs by ultrasound is recommended for men aged 31-75 years who are current or former smokers.  Stay current with your vaccines (immunizations). Document Released: 07/16/2007 Document Revised: 01/22/2013 Document Reviewed: 06/14/2010 [facility] Patient Information 2015 [place], [place]. This information is not intended to replace advice given to you by your health care provider. Make sure you discuss any questions you have with your health care provider.

## 2013-08-14 NOTE — Progress Notes (Signed)
Pre visit review using our clinic review tool, if applicable. No additional management support is needed unless otherwise documented below in the visit note. 

## 2013-08-14 NOTE — Progress Notes (Signed)
Subjective:    Patient ID: Travis Pruitt, male    DOB: January 01, 1936, 78 y.o.   MRN: [id number]  HPI Patient presents to clinic today for annual physical.   Health Maintenance: Dental -- Dentist is off [place], last Dr. Audrie Gallus. Vision -- Sees optometrist for Rx glasses, still working fine. Immunizations -- Up to date Colonoscopy -- UTD until 2019   Review of Systems  Constitutional: Negative for fever, chills, diaphoresis, activity change, appetite change, fatigue and unexpected weight change.  HENT: Negative.   Eyes: Negative.   Respiratory: Negative for apnea, cough, choking, chest tightness, shortness of breath, wheezing and stridor.   Cardiovascular: Negative for chest pain, palpitations and leg swelling.  Gastrointestinal: Negative.  Negative for nausea, vomiting, abdominal pain, diarrhea and blood in stool.  Endocrine: Negative.   Genitourinary: Negative.  Negative for dysuria, urgency and frequency.  Musculoskeletal: Negative.  Negative for arthralgias, back pain and joint swelling.  Skin: Negative.  Negative for color change, pallor, rash and wound.  Allergic/Immunologic: Negative.   Neurological: Negative.  Negative for syncope, light-headedness, numbness and headaches.  Hematological: Negative.   Psychiatric/Behavioral: Negative.  Negative for suicidal ideas, hallucinations, behavioral problems, confusion, sleep disturbance, self-injury and agitation. The patient is not nervous/anxious.   All other systems reviewed and are negative.      Past Medical History  Diagnosis Date  . Gout   . Hyperlipidemia   . Hypertension   . Anxiety   . Diverticulosis   . Colon polyp   . COLONIC POLYPS, ADENOMATOUS, HX OF 11/11/2003    Annotation: COLONOSCOPY 10/05 ([facility]) COLONOSCOPY 04/11/07 Qualifier: Diagnosis of  By: Carlean Purl MD, Dimas Millin     History   Social History  . Marital Status: Married    Spouse Name: N/A    Number of Children: N/A  . Years of Education:  N/A   Occupational History  . Not on file.   Social History Main Topics  . Smoking status: Former Smoker    Quit date: 07/22/1970  . Smokeless tobacco: Not on file  . Alcohol Use: Yes  . Drug Use: Not on file  . Sexual Activity: Not on file   Other Topics Concern  . Not on file   Social History Narrative  . No narrative on file    Past Surgical History  Procedure Laterality Date  . Inguinal hernia repair      Family History  Problem Relation Age of Onset  . Cancer Mother     lung    No Known Allergies  Current Outpatient Prescriptions on File Prior to Visit  Medication Sig Dispense Refill  . allopurinol (ZYLOPRIM) 100 MG tablet Take 1 tablet (100 mg total) by mouth daily.  90 tablet  3  . atenolol (TENORMIN) 50 MG tablet Take 1 tablet (50 mg total) by mouth 2 (two) times daily.  180 tablet  0  . clindamycin (CLEOCIN) 150 MG capsule Take 1 capsule by mouth 3 (three) times daily.       No current facility-administered medications on file prior to visit.   The PFS history was reviewed with the pt at time of visit.  EXAM: BP 128/80  Pulse 60  Temp(Src) 98 F (36.7 C) (Oral)  Resp 18  Ht 5' 9.5" (1.765 m)  Wt 205 lb (92.987 kg)  BMI 29.85 kg/m2     Objective:   Physical Exam  Nursing note and vitals reviewed. Constitutional: He is oriented to person, place, and time. He appears  well-developed and well-nourished. No distress.  HENT:  Head: Normocephalic and atraumatic.  Right Ear: External ear normal.  Left Ear: External ear normal.  Nose: Nose normal.  Mouth/Throat: Oropharynx is clear and moist. No oropharyngeal exudate.  Bilat TMs normal.  Eyes: Conjunctivae and EOM are normal. Pupils are equal, round, and reactive to light. Right eye exhibits no discharge. Left eye exhibits no discharge. No scleral icterus.  Neck: Normal range of motion. Neck supple. No JVD present. No tracheal deviation present. No thyromegaly present.  Cardiovascular: Normal rate,  regular rhythm, normal heart sounds and intact distal pulses.  Exam reveals no gallop and no friction rub.   No murmur heard. Pulmonary/Chest: Effort normal and breath sounds normal. No stridor. No respiratory distress. He has no wheezes. He has no rales. He exhibits no tenderness.  Abdominal: Soft. Bowel sounds are normal. He exhibits no distension and no mass. There is no tenderness. There is no rebound and no guarding.  Genitourinary:  Pt Declined.  Musculoskeletal: Normal range of motion. He exhibits no edema and no tenderness.  Lymphadenopathy:    He has no cervical adenopathy.  Neurological: He is alert and oriented to person, place, and time. He has normal reflexes. No cranial nerve deficit. He exhibits normal muscle tone. Coordination normal.  Skin: Skin is warm and dry. No rash noted. He is not diaphoretic. No erythema. No pallor.  Psychiatric: He has a normal mood and affect. His behavior is normal. Judgment and thought content normal.    Lab Results  Component Value Date   WBC 5.8 08/07/2013   HGB 16.9 08/07/2013   HCT 49.3 08/07/2013   PLT 177.0 08/07/2013   GLUCOSE 92 08/07/2013   CHOL 195 08/07/2013   TRIG 175.0* 08/07/2013   HDL 44.20 08/07/2013   LDLCALC 116* 08/07/2013   ALT 15 08/07/2013   AST 14 08/07/2013   NA 139 08/07/2013   K 4.3 08/07/2013   CL 104 08/07/2013   CREATININE 0.9 08/07/2013   BUN 22 08/07/2013   CO2 28 08/07/2013   TSH 1.12 08/07/2013   PSA 0.26 08/07/2013         Assessment & Plan:  [name] was seen today for annual exam.  Diagnoses and associated orders for this visit:  Preventative health care Comments: Over all healthy. Reviewed recent lab results. Cholesterol slightly elevated, discussed healthy dieat and regular exercise, pt will try this.    Pt has had slightly elevated cholesterol for years and has managed without statin therapy. Discussed diet modifications and getting regular exercise. Pt wishes to try this before considering medication.  Return  precautions provided, and patient handout on health maintenance.  Plan to follow up as needed for all other concerns.  Patient Instructions  You are in very good health.   Continued healthy diet and regular exercise can help bring down your cholesterol.  Return as needed for all other concerns.

## 2013-09-26 ENCOUNTER — Telehealth: Payer: Self-pay | Admitting: Internal Medicine

## 2013-09-26 MED ORDER — ALLOPURINOL 100 MG PO TABS: 100.0000 mg | ORAL_TABLET | Freq: Every day | ORAL | Status: DC

## 2013-09-26 MED ORDER — ATENOLOL 50 MG PO TABS: 50.0000 mg | ORAL_TABLET | Freq: Two times a day (BID) | ORAL | Status: DC

## 2013-09-26 NOTE — Telephone Encounter (Signed)
Pt request refill allopurinol (ZYLOPRIM) 100 MG tablet   90 day w/ 4 refills  atenolol (TENORMIN) 50 MG tablet 90ay w/ 4 refills  pt states these were not done at his appt on 7/15 [facility]

## 2013-09-26 NOTE — Telephone Encounter (Signed)
Rx sent to pharmacy   

## 2013-11-05 ENCOUNTER — Encounter: Payer: Self-pay | Admitting: Family Medicine

## 2013-11-05 ENCOUNTER — Ambulatory Visit (INDEPENDENT_AMBULATORY_CARE_PROVIDER_SITE_OTHER): Payer: 59 | Admitting: Family Medicine

## 2013-11-05 VITALS — BP 130/90 | Temp 98.6°F | Wt 202.0 lb

## 2013-11-05 DIAGNOSIS — Z23 Encounter for immunization: Secondary | ICD-10-CM

## 2013-11-05 DIAGNOSIS — R5383 Other fatigue: Secondary | ICD-10-CM

## 2013-11-05 NOTE — Progress Notes (Signed)
  Garret Reddish, MD Phone: [phone]  Subjective:   Travis Pruitt is a 78 y.o. year old very pleasant male patient who presents with the following:  Fatigue X 2 years. Perhaps mild worsening over last few months but gradual.   Very busy. About to start a new business. Needs all the energy possible to get through this transition. [employer].   Former professional athlete-skiing/hockey. No current exercise.   PHQ 2 of 0   ROS- no chest pain or shortness of breath. No night sweats. No unintentional weight loss. Occasional snoring but no daytime sleepiness. No night sweats.   Past Medical History- Patient Active Problem List   Diagnosis Date Noted  . ANXIETY 11/27/2007  . HYPERLIPIDEMIA 10/25/2006  . GOUT 10/25/2006  . HYPERTENSION 10/25/2006   Medications- reviewed and updated Current Outpatient Prescriptions  Medication Sig Dispense Refill  . allopurinol (ZYLOPRIM) 100 MG tablet Take 1 tablet (100 mg total) by mouth daily.  90 tablet  3  . atenolol (TENORMIN) 50 MG tablet Take 1 tablet (50 mg total) by mouth 2 (two) times daily.  180 tablet  3   No current facility-administered medications for this visit.    Objective: BP 130/90  Temp(Src) 98.6 F (37 C)  Wt 202 lb (91.627 kg) Gen: NAD, resting comfortably CV: RRR no murmurs rubs or gallops Lungs: CTAB no crackles, wheeze, rhonchi Abdomen: soft/nontender/nondistended/normal bowel sounds. No rebound or guarding.  Ext: no edema Skin: warm, dry Neuro: grossly normal, moves all extremities  Assessment/Plan:  Fatigue Doubt cardiac-no chest pain or shortness of breath, primarily just lack of "pep in step". No cough/congestion to suggest lung disease. Does have history of smoking many years ago but think CXR low yield. Up to date on cancer screening-prostate and colon. Recent labwork unremarkable for cause and patient was tired/fatigued at time of that visit. Slight snoring but not regular issue and no  daytime sleepiness. Offerred vitamin D testing but patient declines for now. Atenolol can cause fatigue but has been on over 10 years. ONly real issue is no exercise in 30 years. Patient to start with light amount such as 5 minutes walking 3x a week and build to 150 minutes per week. To see me back in march and sooner if new symptoms appear.   Increased exercise should also help with mild increase in DBP.

## 2013-11-05 NOTE — Patient Instructions (Addendum)
Reviewed labs and history. No clear cause for fatigue other than not exercising currently. Would advise to start with perhaps 5-10 minutes of walking 3x a week with slow increase until you reach 150 minutes per week. Would want you to stop and see me if you have chest pain or shortness of breath.   Follow up in 6 months. Could consider getting vitamin D levels or changing your blood pressure medicine to see if that helps.   Health Maintenance Due  Topic Date Due  . Influenza Vaccine  08/31/2013

## 2014-02-18 ENCOUNTER — Other Ambulatory Visit: Payer: Self-pay | Admitting: Dermatology

## 2014-06-18 ENCOUNTER — Ambulatory Visit (INDEPENDENT_AMBULATORY_CARE_PROVIDER_SITE_OTHER): Payer: 59 | Admitting: Family Medicine

## 2014-06-18 ENCOUNTER — Encounter: Payer: Self-pay | Admitting: Family Medicine

## 2014-06-18 VITALS — BP 148/82 | HR 49 | Temp 98.3°F | Wt 196.0 lb

## 2014-06-18 DIAGNOSIS — F411 Generalized anxiety disorder: Secondary | ICD-10-CM

## 2014-06-18 DIAGNOSIS — I10 Essential (primary) hypertension: Secondary | ICD-10-CM | POA: Diagnosis not present

## 2014-06-18 DIAGNOSIS — Z23 Encounter for immunization: Secondary | ICD-10-CM | POA: Diagnosis not present

## 2014-06-18 DIAGNOSIS — M109 Gout, unspecified: Secondary | ICD-10-CM

## 2014-06-18 MED ORDER — SERTRALINE HCL 50 MG PO TABS: 50.0000 mg | ORAL_TABLET | Freq: Every day | ORAL | Status: DC

## 2014-06-18 NOTE — Patient Instructions (Addendum)
Received final pnuemonia shot today ([id number]).  Restart zoloft. See [place] immediately if any thoughts of hurting yourself.   Blood pressure slightly high today. Let's monitor one more time, may need to give you some assistance with medications but hopefully not as you continue exercise/weight loss

## 2014-06-18 NOTE — Progress Notes (Signed)
Travis Reddish, MD  Subjective:  Travis Pruitt is a 79 y.o. year old very pleasant male patient who presents with:  See problem oriented charting -walking 2-3x a week. Weight down 6 lbs.  ROS- no chest pain, shortness of breath, dizziness  Past Medical History- hyperlipidemia  Medications- reviewed and updated Current Outpatient Prescriptions  Medication Sig Dispense Refill  . allopurinol (ZYLOPRIM) 100 MG tablet Take 1 tablet (100 mg total) by mouth daily. 90 tablet 3  . atenolol (TENORMIN) 50 MG tablet Take 1 tablet (50 mg total) by mouth 2 (two) times daily. 180 tablet 3   No current facility-administered medications for this visit.    Objective: BP 148/82 mmHg  Pulse 49  Temp(Src) 98.3 F (36.8 C)  Wt 196 lb (88.905 kg) Gen: NAD, resting comfortably CV: not bradycardic on my exam, no murmurs rubs or gallops Lungs: CTAB no crackles, wheeze, rhonchi Abdomen: soft/nontender/nondistended/normal bowel sounds. No rebound or guarding.  Ext: no edema Skin: warm, dry   Assessment/Plan:  Essential hypertension S: worsened control systolic, improved diastolic with 6 lb weight loss, more activity A/P: technically at goal per [facility]. Plan to monitor, consider increased rx such as amlodipine if consistently above 145/or 90    Gout S: no flares in last year on allopurinol A/P: continue current rx, consider uric acid level next visit    Generalized anxiety disorder S: suspect GAD as cause of anxiety that was previously improved on zoloft. Patient requests to restart. 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 Discussed SI risk which I believe is low. Follow up 6 months.     October follow up.   Orders Placed This Encounter  Procedures  . Pneumococcal conjugate vaccine 13-valent   Meds ordered this encounter  Medications  . sertraline (ZOLOFT) 50 MG tablet    Sig: Take 1 tablet (50 mg total) by mouth  daily.    Dispense:  90 tablet    Refill:  3

## 2014-06-18 NOTE — Assessment & Plan Note (Addendum)
S: suspect GAD as cause of anxiety that was previously improved on zoloft. Patient requests to restart. 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 Discussed SI risk which I believe is low. Follow up 6 months.

## 2014-06-18 NOTE — Assessment & Plan Note (Signed)
S: no flares in last year on allopurinol A/P: continue current rx, consider uric acid level next visit

## 2014-06-18 NOTE — Assessment & Plan Note (Signed)
S: worsened control systolic, improved diastolic with 6 lb weight loss, more activity A/P: technically at goal per [facility]. Plan to monitor, consider increased rx such as amlodipine if consistently above 145/or 90

## 2014-07-25 ENCOUNTER — Telehealth: Payer: Self-pay | Admitting: Family Medicine

## 2014-07-25 MED ORDER — ATENOLOL 50 MG PO TABS: 50.0000 mg | ORAL_TABLET | Freq: Two times a day (BID) | ORAL | Status: DC

## 2014-07-25 NOTE — Telephone Encounter (Signed)
Medication refilled

## 2014-07-25 NOTE — Telephone Encounter (Signed)
Refill request for Atenolol 50 mg & Allopurinol 100 mg and send to [facility].

## 2014-09-22 ENCOUNTER — Other Ambulatory Visit: Payer: Self-pay | Admitting: *Deleted

## 2014-09-22 MED ORDER — ALLOPURINOL 100 MG PO TABS: 100.0000 mg | ORAL_TABLET | Freq: Every day | ORAL | Status: DC

## 2014-10-27 ENCOUNTER — Other Ambulatory Visit: Payer: 59

## 2014-10-28 ENCOUNTER — Other Ambulatory Visit: Payer: 59

## 2014-10-29 ENCOUNTER — Other Ambulatory Visit (INDEPENDENT_AMBULATORY_CARE_PROVIDER_SITE_OTHER): Payer: 59

## 2014-10-29 DIAGNOSIS — Z0000 Encounter for general adult medical examination without abnormal findings: Secondary | ICD-10-CM | POA: Diagnosis not present

## 2014-10-29 LAB — CBC WITH DIFFERENTIAL/PLATELET
BASOS PCT: 0.5 % (ref 0.0–3.0)
Basophils Absolute: 0 10*3/uL (ref 0.0–0.1)
EOS PCT: 1.8 % (ref 0.0–5.0)
Eosinophils Absolute: 0.1 10*3/uL (ref 0.0–0.7)
HEMATOCRIT: 50.6 % (ref 39.0–52.0)
HEMOGLOBIN: 17.2 g/dL — AB (ref 13.0–17.0)
LYMPHS PCT: 29 % (ref 12.0–46.0)
Lymphs Abs: 1.9 10*3/uL (ref 0.7–4.0)
MCHC: 33.9 g/dL (ref 30.0–36.0)
MCV: 95 fl (ref 78.0–100.0)
MONOS PCT: 8.3 % (ref 3.0–12.0)
Monocytes Absolute: 0.5 10*3/uL (ref 0.1–1.0)
Neutro Abs: 4 10*3/uL (ref 1.4–7.7)
Neutrophils Relative %: 60.4 % (ref 43.0–77.0)
PLATELETS: 180 10*3/uL (ref 150.0–400.0)
RBC: 5.33 Mil/uL (ref 4.22–5.81)
RDW: 13.6 % (ref 11.5–15.5)
WBC: 6.6 10*3/uL (ref 4.0–10.5)

## 2014-10-29 LAB — BASIC METABOLIC PANEL
BUN: 18 mg/dL (ref 6–23)
CHLORIDE: 105 meq/L (ref 96–112)
CO2: 30 meq/L (ref 19–32)
CREATININE: 0.98 mg/dL (ref 0.40–1.50)
Calcium: 9 mg/dL (ref 8.4–10.5)
GFR: 78.87 mL/min (ref >60.00)
Glucose, Bld: 97 mg/dL (ref 70–99)
POTASSIUM: 4 meq/L (ref 3.5–5.1)
SODIUM: 141 meq/L (ref 135–145)

## 2014-10-29 LAB — LIPID PANEL
CHOLESTEROL: 169 mg/dL (ref 0–200)
HDL: 48.6 mg/dL (ref >39.00)
LDL CALC: 93 mg/dL (ref 0–99)
NONHDL: 120.88
Total CHOL/HDL Ratio: 3
Triglycerides: 139 mg/dL (ref 0.0–149.0)
VLDL: 27.8 mg/dL (ref 0.0–40.0)

## 2014-10-29 LAB — HEPATIC FUNCTION PANEL
ALBUMIN: 3.8 g/dL (ref 3.5–5.2)
ALT: 12 U/L (ref 0–53)
AST: 13 U/L (ref 0–37)
Alkaline Phosphatase: 49 U/L (ref 39–117)
Bilirubin, Direct: 0.1 mg/dL (ref 0.0–0.3)
TOTAL PROTEIN: 6.1 g/dL (ref 6.0–8.3)
Total Bilirubin: 0.7 mg/dL (ref 0.2–1.2)

## 2014-10-29 LAB — PSA: PSA: 0.29 ng/mL (ref 0.10–4.00)

## 2014-10-29 LAB — TSH: TSH: 1.06 u[IU]/mL (ref 0.35–4.50)

## 2014-10-30 LAB — POCT URINALYSIS DIPSTICK
Bilirubin, UA: NEGATIVE
Glucose, UA: NEGATIVE
Ketones, UA: NEGATIVE
Leukocytes, UA: NEGATIVE
Nitrite, UA: NEGATIVE
RBC UA: NEGATIVE
UROBILINOGEN UA: 0.2
pH, UA: 6

## 2014-11-03 ENCOUNTER — Encounter: Payer: Self-pay | Admitting: Family Medicine

## 2014-11-03 ENCOUNTER — Ambulatory Visit (INDEPENDENT_AMBULATORY_CARE_PROVIDER_SITE_OTHER): Payer: 59 | Admitting: Family Medicine

## 2014-11-03 VITALS — BP 160/80 | HR 60 | Temp 98.5°F | Ht 69.0 in | Wt 196.0 lb

## 2014-11-03 DIAGNOSIS — Z23 Encounter for immunization: Secondary | ICD-10-CM | POA: Diagnosis not present

## 2014-11-03 DIAGNOSIS — Z0000 Encounter for general adult medical examination without abnormal findings: Secondary | ICD-10-CM

## 2014-11-03 DIAGNOSIS — Z1211 Encounter for screening for malignant neoplasm of colon: Secondary | ICD-10-CM

## 2014-11-03 DIAGNOSIS — I10 Essential (primary) hypertension: Secondary | ICD-10-CM

## 2014-11-03 NOTE — Progress Notes (Signed)
Travis Reddish, MD Phone: [phone]  Subjective:  Patient presents today for their annual physical. Chief complaint-noted.   -doing well. Not sleeping as well as he would like but falling asleep. Wakes up thinking about his projects. Anxiety improved on zoloft ROS- full  review of systems was completed and negative except for: poor sleep, no recent gout flare, no chest pain or shortness of breath  The following were reviewed and entered/updated in epic: Past Medical History  Diagnosis Date  . Gout   . Hyperlipidemia   . Hypertension   . Anxiety   . Diverticulosis   . Colon polyp   . COLONIC POLYPS, ADENOMATOUS, HX OF 11/11/2003    Annotation: COLONOSCOPY 10/05 ([facility]) COLONOSCOPY 04/11/07 Qualifier: Diagnosis of  By: Carlean Purl MD, Tonna Boehringer E    Patient Active Problem List   Diagnosis Date Noted  . Generalized anxiety disorder 11/27/2007    Priority: Medium  . Hyperlipidemia 10/25/2006    Priority: Medium  . Gout 10/25/2006    Priority: Medium  . Essential hypertension 10/25/2006    Priority: Medium   Past Surgical History  Procedure Laterality Date  . Inguinal hernia repair      Family History  Problem Relation Age of Onset  . Cancer Mother     lung    Medications- reviewed and updated Current Outpatient Prescriptions  Medication Sig Dispense Refill  . allopurinol (ZYLOPRIM) 100 MG tablet Take 1 tablet (100 mg total) by mouth daily. 90 tablet 1  . atenolol (TENORMIN) 50 MG tablet Take 1 tablet (50 mg total) by mouth 2 (two) times daily. 180 tablet 3  . sertraline (ZOLOFT) 50 MG tablet Take 1 tablet (50 mg total) by mouth daily. 90 tablet 3   No current facility-administered medications for this visit.    Allergies-reviewed and updated No Known Allergies  Social History   Social History  . Marital Status: Married    Spouse Name: N/A  . Number of Children: N/A  . Years of Education: N/A   Social History Main Topics  . Smoking status: Former Smoker      Quit date: 07/22/1970  . Smokeless tobacco: None  . Alcohol Use: Yes  . Drug Use: None  . Sexual Activity: Not Asked   Other Topics Concern  . None   Social History Narrative   Married    ROS--See HPI   Objective: BP 160/80 mmHg  Pulse 60  Temp(Src) 98.5 F (36.9 C)  Ht 5\' 9"  (1.753 m)  Wt 196 lb (88.905 kg)  BMI 28.93 kg/m2 Gen: NAD, resting comfortably HEENT: Mucous membranes are moist. Oropharynx normal Neck: no thyromegaly CV: RRR no murmurs rubs or gallops Lungs: CTAB no crackles, wheeze, rhonchi Abdomen: soft/nontender/nondistended/normal bowel sounds. No rebound or guarding.  Rectal: normal tone, rather small prostate, no masses or tenderness  Ext: no edema, 2+ PT pulses Skin: warm, dry, no rash Neuro: grossly normal, moves all extremities, PERRLA  Assessment/Plan:  79 y.o. male presenting for annual physical.  Health Maintenance counseling: 1. Anticipatory guidance: Patient counseled regarding regular dental exams, wearing seatbelts, wearing sunscreen 2. Risk factor reduction:  Advised patient of need for regular exercise and diet rich and fruits and vegetables to reduce risk of heart attack and stroke.  3. Immunizations/screenings/ancillary studies Health Maintenance Due  Topic Date Due  . INFLUENZA VACCINE  09/01/2014  4. Prostate cancer screening- low risk based off PSA and rectal exam 5. Colon cancer screening - 04/11/2007 with adenoma noted - refer at this time,  likely should have had 5 year repeat 6. Skin cancer screening- follows up twice a year with dermatology  History of hyperlipipidemia now controlled without medications (had increased walking) Lab Results  Component Value Date   CHOL 169 10/29/2014   HDL 48.60 10/29/2014   LDLCALC 93 10/29/2014   TRIG 139.0 10/29/2014   CHOLHDL 3 10/29/2014   GAD- improved control  Gout- no flares  Essential hypertension S: poor control and trending up on atenolol 50mg  BID BP Readings from Last 3  Encounters:  11/03/14 160/80  06/18/14 148/82  11/05/13 130/90  A/P: Goal per [facility] of 150/90. We set goal 145/90. Continue home meds, get home cuff. Follow up 4-6 weeks, if elevated above goal- consider amlodipine  4-6 week BP check  Orders Placed This Encounter  Procedures  . Flu Vaccine QUAD 36+ mos IM  . Ambulatory referral to Gastroenterology    Referral Priority:  Routine    Referral Type:  Consultation    Referral Reason:  Specialty Services Required    Number of Visits Requested:  1

## 2014-11-03 NOTE — Assessment & Plan Note (Signed)
S: poor control and trending up on atenolol 50mg  BID BP Readings from Last 3 Encounters:  11/03/14 160/80  06/18/14 148/82  11/05/13 130/90  A/P: Goal per [facility] of 150/90. We set goal 145/90. Continue home meds, get home cuff. Follow up 4-6 weeks, if elevated above goal- consider amlodipine

## 2014-11-03 NOTE — Patient Instructions (Addendum)
Flu shot received today.  [facility] will call you to schedule colonoscopy. We can try again next year if they cannot do it before calendar end You need this given history of precancerous polyp  Blood pressure trending up on top # BP Readings from Last 3 Encounters:  11/03/14 160/80  06/18/14 148/82  11/05/13 130/90  Goal we set was <145/90 See me within 4-6 weeks for repeat Consider getting home cuff, logging pressure daily and bringing cuff with you to visit  Otherwise things look great

## 2014-11-04 ENCOUNTER — Encounter: Payer: Self-pay | Admitting: Internal Medicine

## 2014-11-18 ENCOUNTER — Ambulatory Visit: Payer: 59 | Admitting: Family Medicine

## 2014-12-01 ENCOUNTER — Encounter: Payer: Self-pay | Admitting: Family Medicine

## 2014-12-01 ENCOUNTER — Ambulatory Visit (INDEPENDENT_AMBULATORY_CARE_PROVIDER_SITE_OTHER): Payer: 59 | Admitting: Family Medicine

## 2014-12-01 VITALS — BP 132/74 | HR 57 | Temp 98.5°F | Wt 195.0 lb

## 2014-12-01 DIAGNOSIS — F411 Generalized anxiety disorder: Secondary | ICD-10-CM

## 2014-12-01 DIAGNOSIS — I10 Essential (primary) hypertension: Secondary | ICD-10-CM | POA: Diagnosis not present

## 2014-12-01 NOTE — Assessment & Plan Note (Signed)
S: symptoms tolerable on zoloft 50mg /controlled A/P: continue current dose of zoloft

## 2014-12-01 NOTE — Progress Notes (Signed)
Travis Reddish, MD  Subjective:  Travis Pruitt is a 79 y.o. year old very pleasant male patient who presents for/with See problem oriented charting ROS- No chest pain or shortness of breath. No headache or blurry vision.   Past Medical History-  Patient Active Problem List   Diagnosis Date Noted  . Generalized anxiety disorder 11/27/2007    Priority: Medium  . Hyperlipidemia 10/25/2006    Priority: Medium  . Gout 10/25/2006    Priority: Medium  . Essential hypertension 10/25/2006    Priority: Medium    Medications- reviewed and updated Current Outpatient Prescriptions  Medication Sig Dispense Refill  . allopurinol (ZYLOPRIM) 100 MG tablet Take 1 tablet (100 mg total) by mouth daily. 90 tablet 1  . atenolol (TENORMIN) 50 MG tablet Take 1 tablet (50 mg total) by mouth 2 (two) times daily. 180 tablet 3  . sertraline (ZOLOFT) 50 MG tablet Take 1 tablet (50 mg total) by mouth daily. 90 tablet 3   No current facility-administered medications for this visit.    Objective: BP 132/74 mmHg  Pulse 57  Temp(Src) 98.5 F (36.9 C)  Wt 195 lb (88.451 kg) Gen: NAD, resting comfortably CV: RRR no murmurs rubs or gallops Lungs: CTAB no crackles, wheeze, rhonchi Abdomen: soft/nontender/nondistended/normal bowel sounds.  Ext: no edema Skin: warm, dry Neuro: grossly normal, moves all extremities  Assessment/Plan:  Essential hypertension S: controlled. Much improved from last 2 visits. Patient states stress level much better today- has completed some projects that are important to him. Compliant with Atenolol 50mg  BID. Weight down 1 lb. Plan was to get a home cuff and monitor but has not done that BP Readings from Last 3 Encounters:  12/01/14 132/74  11/03/14 160/80  06/18/14 148/82  A/P:Continue current meds:  Atenolol 50mg  BID.  We discussed home monitoring once again- advised patient to do so. Patient also aware importance of healthy eating and exercise.    Generalized  anxiety disorder S: symptoms tolerable on zoloft 50mg /controlled A/P: continue current dose of zoloft   6 months f/u

## 2014-12-01 NOTE — Patient Instructions (Addendum)
Blood pressure trending up previously, looks better today BP Readings from Last 3 Encounters:  12/01/14 132/74  11/03/14 160/80  06/18/14 148/82  Goal we set was <145/90; <140/90 even better Consider getting home cuff, logging pressure daily and bringing cuff with you to visit  Otherwise things look great

## 2014-12-01 NOTE — Assessment & Plan Note (Signed)
S: controlled. Much improved from last 2 visits. Patient states stress level much better today- has completed some projects that are important to him. Compliant with Atenolol 50mg  BID. Weight down 1 lb. Plan was to get a home cuff and monitor but has not done that BP Readings from Last 3 Encounters:  12/01/14 132/74  11/03/14 160/80  06/18/14 148/82  A/P:Continue current meds:  Atenolol 50mg  BID.  We discussed home monitoring once again- advised patient to do so. Patient also aware importance of healthy eating and exercise.

## 2014-12-19 ENCOUNTER — Other Ambulatory Visit: Payer: Self-pay | Admitting: Family Medicine

## 2014-12-23 ENCOUNTER — Ambulatory Visit (AMBULATORY_SURGERY_CENTER): Payer: Self-pay

## 2014-12-23 VITALS — Ht 70.0 in | Wt 195.0 lb

## 2014-12-23 DIAGNOSIS — Z860100 Personal history of colon polyps, unspecified: Secondary | ICD-10-CM

## 2014-12-23 NOTE — Progress Notes (Signed)
No diet/weight loss meds No home oxygen No allergies to eggs or soy No past problems with anesthesia  Has email and internet; refused emmi

## 2014-12-30 ENCOUNTER — Encounter: Payer: Self-pay | Admitting: Internal Medicine

## 2015-01-06 ENCOUNTER — Ambulatory Visit (AMBULATORY_SURGERY_CENTER): Payer: 59 | Admitting: Internal Medicine

## 2015-01-06 ENCOUNTER — Encounter: Payer: Self-pay | Admitting: Internal Medicine

## 2015-01-06 VITALS — BP 185/90 | HR 53 | Temp 97.0°F | Resp 20 | Ht 70.0 in | Wt 195.0 lb

## 2015-01-06 DIAGNOSIS — D122 Benign neoplasm of ascending colon: Secondary | ICD-10-CM | POA: Diagnosis not present

## 2015-01-06 DIAGNOSIS — D120 Benign neoplasm of cecum: Secondary | ICD-10-CM

## 2015-01-06 DIAGNOSIS — D123 Benign neoplasm of transverse colon: Secondary | ICD-10-CM

## 2015-01-06 DIAGNOSIS — Z86010 Personal history of colonic polyps: Secondary | ICD-10-CM

## 2015-01-06 MED ORDER — SODIUM CHLORIDE 0.9 % IV SOLN: 500.0000 mL | INTRAVENOUS | Status: DC

## 2015-01-06 NOTE — Op Note (Signed)
[place]  [address] [place], [address]   COLONOSCOPY PROCEDURE REPORT  PATIENT: Travis Pruitt, Travis Pruitt  MR#: [id number] BIRTHDATE: [birth year] , 78  yrs. old GENDER: male ENDOSCOPIST: Gatha Mayer, MD, [facility] PROCEDURE DATE:  01/06/2015 PROCEDURE:   Colonoscopy, surveillance , Colonoscopy with biopsy, and Colonoscopy with snare polypectomy First Screening Colonoscopy - Avg.  risk and is 50 yrs.  old or older - No.  Prior Negative Screening - Now for repeat screening. N/A  History of Adenoma - Now for follow-up colonoscopy & has been > or = to 3 yrs.  Yes hx of adenoma.  Has been 3 or more years since last colonoscopy.  Polyps removed today? Yes ASA CLASS:   Class II INDICATIONS:Surveillance due to prior colonic neoplasia and PH Colon Adenoma. MEDICATIONS: Propofol 200 mg IV and Monitored anesthesia care  DESCRIPTION OF PROCEDURE:   After the risks benefits and alternatives of the procedure were thoroughly explained, informed consent was obtained.  The digital rectal exam revealed no abnormalities of the rectum, revealed the prostate was not enlarged, and revealed no prostatic nodules.   The LB [id number]-[id number]  endoscope was introduced through the anus and advanced to the cecum, which was identified by both the appendix and ileocecal valve. No adverse events experienced.   The quality of the prep was good.  (MiraLax was used)  The instrument was then slowly withdrawn as the colon was fully examined. Estimated blood loss is zero unless otherwise noted in this procedure report.  COLON FINDINGS: Three polypoid shaped sessile polyps ranging from 2 to 93mm in size were found at the cecum, in the ascending colon, and transverse colon.  Polypectomies were performed with cold forceps and with a cold snare.  The resection was complete, the polyp tissue was completely retrieved and sent to histology.   There was severe diverticulosis noted in the sigmoid colon with  associated luminal narrowing.   There was mild diverticulosis noted in the ascending colon.   Internal hemorrhoids were found.   The examination was otherwise normal.  Retroflexed views revealed internal hemorrhoids. The time to cecum = 2.9 Withdrawal time = 15.8   The scope was withdrawn and the procedure completed. COMPLICATIONS: There were no immediate complications.  ENDOSCOPIC IMPRESSION: 1.   Three sessile polyps ranging from 2 to 4mm in size were found at the cecum, in the ascending colon, and transverse colon; polypectomies were performed with cold forceps and with a cold snare 2.   There was severe diverticulosis noted in the sigmoid colon 3.   There was mild diverticulosis noted in the ascending colon 4.   Internal hemorrhoids 5.   The examination was otherwise normal - good prep - pediatric scope used RECOMMENDATIONS: Timing of repeat colonoscopy will be determined by pathology findings.  may not need another routine (age). He does have hx adenomas 2005 and 2009.  eSigned:  Gatha Mayer, MD, [facility] 01/06/2015 9:21 AM  cc: Dr. Garret Reddish and The Patient

## 2015-01-06 NOTE — Progress Notes (Signed)
Called to room to assist during endoscopic procedure.  Patient ID and intended procedure confirmed with present staff. Received instructions for my participation in the procedure from the performing physician.  

## 2015-01-06 NOTE — Progress Notes (Signed)
To pacu  Awake and alert, report to RN 

## 2015-01-06 NOTE — Patient Instructions (Addendum)
I found and removed 3 tiny polyps that look benign.  I will let you know pathology results and when/if to have another routine colonoscopy by mail.  I appreciate the opportunity to care for you. Gatha Mayer, MD, FACG YOU HAD AN ENDOSCOPIC PROCEDURE TODAY AT [facility]:   Refer to the procedure report that was given to you for any specific questions about what was found during the examination.  If the procedure report does not answer your questions, please call your gastroenterologist to clarify.  If you requested that your care partner not be given the details of your procedure findings, then the procedure report has been included in a sealed envelope for you to review at your convenience later.  YOU SHOULD EXPECT: Some feelings of bloating in the abdomen. Passage of more gas than usual.  Walking can help get rid of the air that was put into your GI tract during the procedure and reduce the bloating. If you had a lower endoscopy (such as a colonoscopy or flexible sigmoidoscopy) you may notice spotting of blood in your stool or on the toilet paper. If you underwent a bowel prep for your procedure, you may not have a normal bowel movement for a few days.  Please Note:  You might notice some irritation and congestion in your nose or some drainage.  This is from the oxygen used during your procedure.  There is no need for concern and it should clear up in a day or so.  SYMPTOMS TO REPORT IMMEDIATELY:   Following lower endoscopy (colonoscopy or flexible sigmoidoscopy):  Excessive amounts of blood in the stool  Significant tenderness or worsening of abdominal pains  Swelling of the abdomen that is new, acute  Fever of 100F or higher   For urgent or emergent issues, a gastroenterologist can be reached at any hour by calling [phone].   DIET: Your first meal following the procedure should be a small meal and then it is ok to progress to your normal diet. Heavy  or fried foods are harder to digest and may make you feel nauseous or bloated.  Likewise, meals heavy in dairy and vegetables can increase bloating.  Drink plenty of fluids but you should avoid alcoholic beverages for 24 hours.  ACTIVITY:  You should plan to take it easy for the rest of today and you should NOT DRIVE or use heavy machinery until tomorrow (because of the sedation medicines used during the test).    FOLLOW UP: Our staff will call the number listed on your records the next business day following your procedure to check on you and address any questions or concerns that you may have regarding the information given to you following your procedure. If we do not reach you, we will leave a message.  However, if you are feeling well and you are not experiencing any problems, there is no need to return our call.  We will assume that you have returned to your regular daily activities without incident.  If any biopsies were taken you will be contacted by phone or by letter within the next 1-3 weeks.  Please call us at [phone] if you have not heard about the biopsies in 3 weeks.    SIGNATURES/CONFIDENTIALITY: You and/or your care partner have signed paperwork which will be entered into your electronic medical record.  These signatures attest to the fact that that the information above on your After Visit Summary has been reviewed and is  understood.  Full responsibility of the confidentiality of this discharge information lies with you and/or your care-partner. 

## 2015-01-07 ENCOUNTER — Telehealth: Payer: Self-pay

## 2015-01-07 NOTE — Telephone Encounter (Signed)
Unable to leave message no answer or answering machine,

## 2015-01-08 ENCOUNTER — Encounter: Payer: Self-pay | Admitting: Internal Medicine

## 2015-01-12 ENCOUNTER — Encounter: Payer: Self-pay | Admitting: Internal Medicine

## 2015-01-12 DIAGNOSIS — Z860101 Personal history of adenomatous and serrated colon polyps: Secondary | ICD-10-CM

## 2015-01-12 DIAGNOSIS — Z86010 Personal history of colonic polyps: Secondary | ICD-10-CM

## 2015-01-12 HISTORY — DX: Personal history of adenomatous and serrated colon polyps: Z86.0101

## 2015-01-12 HISTORY — DX: Personal history of colonic polyps: Z86.010

## 2015-01-12 NOTE — Progress Notes (Signed)
Quick Note:  2 diminutive adenomas - no routine repat colonoscopy (age) ______

## 2015-03-09 ENCOUNTER — Telehealth: Payer: Self-pay | Admitting: Internal Medicine

## 2015-03-09 NOTE — Telephone Encounter (Signed)
Patient encouraged to try a high fiber diet, add a probiotic, and Miralax PRN.  He will call back if his symptoms do not improve

## 2015-05-29 ENCOUNTER — Encounter: Payer: Self-pay | Admitting: Family Medicine

## 2015-05-29 ENCOUNTER — Ambulatory Visit (INDEPENDENT_AMBULATORY_CARE_PROVIDER_SITE_OTHER): Payer: 59 | Admitting: Family Medicine

## 2015-05-29 VITALS — BP 130/70 | HR 58 | Temp 99.2°F | Wt 194.0 lb

## 2015-05-29 DIAGNOSIS — F411 Generalized anxiety disorder: Secondary | ICD-10-CM | POA: Diagnosis not present

## 2015-05-29 DIAGNOSIS — I10 Essential (primary) hypertension: Secondary | ICD-10-CM | POA: Diagnosis not present

## 2015-05-29 DIAGNOSIS — M109 Gout, unspecified: Secondary | ICD-10-CM | POA: Diagnosis not present

## 2015-05-29 NOTE — Patient Instructions (Addendum)
No changes in medication  Continue your supply of allopurinol- please let me know if you have any gout flares

## 2015-05-29 NOTE — Progress Notes (Signed)
Subjective:  Travis Pruitt is a 80 y.o. year old very pleasant male patient who presents for/with See problem oriented charting ROS- No chest pain or shortness of breath. No headache or blurry vision. No hot swollen joints  Past Medical History-  Patient Active Problem List   Diagnosis Date Noted  . Generalized anxiety disorder 11/27/2007    Priority: Medium  . Hyperlipidemia 10/25/2006    Priority: Medium  . Gout 10/25/2006    Priority: Medium  . Essential hypertension 10/25/2006    Priority: Medium  . Hx of adenomatous colonic polyps 01/12/2015    Medications- reviewed and updated Current Outpatient Prescriptions  Medication Sig Dispense Refill  . atenolol (TENORMIN) 50 MG tablet Take 1 tablet (50 mg total) by mouth 2 (two) times daily. 180 tablet 3  . sertraline (ZOLOFT) 50 MG tablet TAKE 1 TABLET (50 MG TOTAL) BY MOUTH DAILY. (Patient taking differently: take 1/2 daily) 90 tablet 2   No current facility-administered medications for this visit.    Objective: BP 130/70 mmHg  Pulse 58  Temp(Src) 99.2 F (37.3 C)  Wt 194 lb (87.998 kg) Gen: NAD, resting comfortably CV: RRR no murmurs rubs or gallops Lungs: CTAB no crackles, wheeze, rhonchi Abdomen: soft/nontender/nondistended/normal bowel sounds. No rebound or guarding.  Ext: no edema Skin: warm, dry Neuro: grossly normal, moves all extremities   Assessment/Plan:  Gout S: apparently has an excess of prior 300mg  pills- he has stopped the 100mg  rx and is using 1/2 of old pills until he runs out. No gout flares on this A/P: continue current medication, ok to go back to 100mg  allopurinol when he runs out.   Hypertension S: controlled on atenolol 50mg  BID BP Readings from Last 3 Encounters:  05/29/15 130/70  01/06/15 185/90  12/01/14 132/74  A/P:Continue current meds:  No change, very mild bradycardia  GAD S: symptoms stable on zoloft 50mg . No SI/HI A/P: continue current prescription  Return in about 6  months (around 11/28/2015) for physical. Return precautions advised.   Garret Reddish, MD

## 2015-05-30 NOTE — Assessment & Plan Note (Signed)
S: apparently has an excess of prior 300mg  pills- he has stopped the 100mg  rx and is using 1/2 of old pills until he runs out. No gout flares on this A/P: continue current medication, ok to go back to 100mg  allopurinol when he runs out.

## 2015-06-08 ENCOUNTER — Encounter: Payer: Self-pay | Admitting: Family Medicine

## 2015-06-08 ENCOUNTER — Ambulatory Visit (INDEPENDENT_AMBULATORY_CARE_PROVIDER_SITE_OTHER): Payer: 59 | Admitting: Family Medicine

## 2015-06-08 VITALS — BP 122/80 | HR 52 | Temp 98.6°F | Wt 192.0 lb

## 2015-06-08 DIAGNOSIS — J069 Acute upper respiratory infection, unspecified: Secondary | ICD-10-CM

## 2015-06-08 MED ORDER — GUAIFENESIN-CODEINE 100-10 MG/5ML PO SOLN: 5.0000 mL | Freq: Four times a day (QID) | ORAL | Status: DC | PRN

## 2015-06-08 NOTE — Progress Notes (Signed)
PCP: Garret Reddish, MD  Subjective:  Travis Pruitt is a 80 y.o. year old very pleasant male patient who presents with Upper Respiratory infection symptoms including nasal congestion, cough mianly dry. Has some fatigue as well.  -started: 6 days ago on Wednesday and symptoms  show no change -previous treatments: 1 dose of mucinex.  -sick contacts/travel/risks: denies flu exposure. Wife has been sick for 2 weeks withsimilar  ROS-denies fever, SOB, NVD, tooth pain or sinus pain  Pertinent Past Medical History-  Patient Active Problem List   Diagnosis Date Noted  . Generalized anxiety disorder 11/27/2007    Priority: Medium  . Hyperlipidemia 10/25/2006    Priority: Medium  . Gout 10/25/2006    Priority: Medium  . Essential hypertension 10/25/2006    Priority: Medium  . Hx of adenomatous colonic polyps 01/12/2015   Medications- reviewed  Current Outpatient Prescriptions  Medication Sig Dispense Refill  . atenolol (TENORMIN) 50 MG tablet Take 1 tablet (50 mg total) by mouth 2 (two) times daily. 180 tablet 3  . sertraline (ZOLOFT) 50 MG tablet TAKE 1 TABLET (50 MG TOTAL) BY MOUTH DAILY. (Patient taking differently: take 1/2 daily) 90 tablet 2   Objective: BP 122/80 mmHg  Pulse 52  Temp(Src) 98.6 F (37 C)  Wt 192 lb (87.091 kg) Gen: NAD, resting comfortably HEENT: Turbinates erythematous with clear rhinorrhea, TM normal, pharynx mildly erythematous with no tonsilar exudate or edema, no sinus tenderness CV: RRR no murmurs rubs or gallops Lungs: CTAB no crackles, wheeze, rhonchi Abdomen: soft/nontender/nondistended/normal bowel sounds. No rebound or guarding.  Ext: no edema Skin: warm, dry, no rash Neuro: grossly normal, moves all extremities  Assessment/Plan:  Upper Respiratory infection History and exam today are suggestive of viral upper respiratory infection.  We discussed that a serious infection or illness is unlikely- no pneumonia on exam We also discussed reasons  why current illness does not meet criteria for bacterial illness and therefore no antibiotics indicated. Also educated on signs that bacterial infection may have developed.   Symptomatic treatment with: codeine cough syrup to help sleep- do not take mucinex with this as it has mucinex in it  Finally, we reviewed reasons to return to care including if symptoms worsen or persist or new concerns arise especially fever or shortness of breath  Meds ordered this encounter  Medications  . guaiFENesin-codeine 100-10 MG/5ML syrup    Sig: Take 5 mLs by mouth every 6 (six) hours as needed for cough (do not drive for 8 hours after taking).    Dispense:  118 mL    Refill:  0

## 2015-06-08 NOTE — Patient Instructions (Signed)
Upper Respiratory infection History and exam today are suggestive of viral upper respiratory infection.  We discussed that a serious infection or illness is unlikely- no pneumonia on exam We also discussed reasons why current illness does not meet criteria for bacterial illness and therefore no antibiotics indicated. Also educated on signs that bacterial infection may have developed.   Symptomatic treatment with: codeine cough syrup to help sleep- do not take mucinex with this as it has mucinex in it  Finally, we reviewed reasons to return to care including if symptoms worsen or persist or new concerns arise especially fever or shortness of breath  Meds ordered this encounter  Medications  . guaiFENesin-codeine 100-10 MG/5ML syrup    Sig: Take 5 mLs by mouth every 6 (six) hours as needed for cough (do not drive for 8 hours after taking).    Dispense:  118 mL    Refill:  0

## 2015-09-11 ENCOUNTER — Other Ambulatory Visit: Payer: Self-pay | Admitting: Family Medicine

## 2015-09-16 ENCOUNTER — Other Ambulatory Visit: Payer: Self-pay | Admitting: Family Medicine

## 2015-11-24 ENCOUNTER — Other Ambulatory Visit (INDEPENDENT_AMBULATORY_CARE_PROVIDER_SITE_OTHER): Payer: 59

## 2015-11-24 DIAGNOSIS — R319 Hematuria, unspecified: Secondary | ICD-10-CM

## 2015-11-24 DIAGNOSIS — Z0000 Encounter for general adult medical examination without abnormal findings: Secondary | ICD-10-CM | POA: Diagnosis not present

## 2015-11-24 LAB — HEPATIC FUNCTION PANEL
ALBUMIN: 3.9 g/dL (ref 3.5–5.2)
ALK PHOS: 47 U/L (ref 39–117)
ALT: 12 U/L (ref 0–53)
AST: 13 U/L (ref 0–37)
Bilirubin, Direct: 0.1 mg/dL (ref 0.0–0.3)
TOTAL PROTEIN: 6 g/dL (ref 6.0–8.3)
Total Bilirubin: 0.8 mg/dL (ref 0.2–1.2)

## 2015-11-24 LAB — BASIC METABOLIC PANEL
BUN: 22 mg/dL (ref 6–23)
CALCIUM: 9 mg/dL (ref 8.4–10.5)
CO2: 28 mEq/L (ref 19–32)
Chloride: 104 mEq/L (ref 96–112)
Creatinine, Ser: 1 mg/dL (ref 0.40–1.50)
GFR: 76.84 mL/min (ref >60.00)
Glucose, Bld: 99 mg/dL (ref 70–99)
POTASSIUM: 4.5 meq/L (ref 3.5–5.1)
SODIUM: 139 meq/L (ref 135–145)

## 2015-11-24 LAB — CBC WITH DIFFERENTIAL/PLATELET
BASOS ABS: 0 10*3/uL (ref 0.0–0.1)
BASOS PCT: 0.4 % (ref 0.0–3.0)
Eosinophils Absolute: 0.1 10*3/uL (ref 0.0–0.7)
Eosinophils Relative: 1.1 % (ref 0.0–5.0)
HEMATOCRIT: 49.7 % (ref 39.0–52.0)
HEMOGLOBIN: 17 g/dL (ref 13.0–17.0)
LYMPHS PCT: 31.5 % (ref 12.0–46.0)
Lymphs Abs: 1.8 10*3/uL (ref 0.7–4.0)
MCHC: 34.3 g/dL (ref 30.0–36.0)
MCV: 94.3 fl (ref 78.0–100.0)
MONOS PCT: 9.1 % (ref 3.0–12.0)
Monocytes Absolute: 0.5 10*3/uL (ref 0.1–1.0)
NEUTROS ABS: 3.3 10*3/uL (ref 1.4–7.7)
Neutrophils Relative %: 57.9 % (ref 43.0–77.0)
PLATELETS: 173 10*3/uL (ref 150.0–400.0)
RBC: 5.27 Mil/uL (ref 4.22–5.81)
RDW: 13.1 % (ref 11.5–15.5)
WBC: 5.7 10*3/uL (ref 4.0–10.5)

## 2015-11-24 LAB — POC URINALSYSI DIPSTICK (AUTOMATED)
BILIRUBIN UA: NEGATIVE
Glucose, UA: NEGATIVE
KETONES UA: NEGATIVE
Leukocytes, UA: NEGATIVE
NITRITE UA: NEGATIVE
Protein, UA: NEGATIVE
Spec Grav, UA: 1.02
UROBILINOGEN UA: 0.2
pH, UA: 6

## 2015-11-24 LAB — URINALYSIS, MICROSCOPIC ONLY
RBC / HPF: NONE SEEN (ref 0–?)
WBC, UA: NONE SEEN (ref 0–?)

## 2015-11-24 LAB — LIPID PANEL
CHOL/HDL RATIO: 4
Cholesterol: 177 mg/dL (ref 0–200)
HDL: 49.9 mg/dL (ref >39.00)
LDL Cholesterol: 98 mg/dL (ref 0–99)
NONHDL: 126.71
Triglycerides: 142 mg/dL (ref 0.0–149.0)
VLDL: 28.4 mg/dL (ref 0.0–40.0)

## 2015-11-24 LAB — TSH: TSH: 1.14 u[IU]/mL (ref 0.35–4.50)

## 2015-11-30 ENCOUNTER — Emergency Department (HOSPITAL_COMMUNITY): Payer: Medicare Other

## 2015-11-30 ENCOUNTER — Encounter (HOSPITAL_COMMUNITY): Payer: Self-pay | Admitting: Emergency Medicine

## 2015-11-30 ENCOUNTER — Telehealth: Payer: Self-pay | Admitting: Family Medicine

## 2015-11-30 ENCOUNTER — Inpatient Hospital Stay (HOSPITAL_COMMUNITY)
Admission: EM | Admit: 2015-11-30 | Discharge: 2015-12-05 | DRG: 183 | Disposition: A | Discharge status: Home / Self Care | Payer: Medicare Other | Attending: General Surgery | Admitting: General Surgery

## 2015-11-30 DIAGNOSIS — K449 Diaphragmatic hernia without obstruction or gangrene: Secondary | ICD-10-CM | POA: Diagnosis present

## 2015-11-30 DIAGNOSIS — S270XXA Traumatic pneumothorax, initial encounter: Secondary | ICD-10-CM | POA: Diagnosis present

## 2015-11-30 DIAGNOSIS — N281 Cyst of kidney, acquired: Secondary | ICD-10-CM

## 2015-11-30 DIAGNOSIS — I7101 Dissection of thoracic aorta: Secondary | ICD-10-CM | POA: Diagnosis present

## 2015-11-30 DIAGNOSIS — Z23 Encounter for immunization: Secondary | ICD-10-CM | POA: Diagnosis present

## 2015-11-30 DIAGNOSIS — F411 Generalized anxiety disorder: Secondary | ICD-10-CM | POA: Diagnosis present

## 2015-11-30 DIAGNOSIS — J9811 Atelectasis: Secondary | ICD-10-CM | POA: Diagnosis present

## 2015-11-30 DIAGNOSIS — Z791 Long term (current) use of non-steroidal anti-inflammatories (NSAID): Secondary | ICD-10-CM | POA: Diagnosis not present

## 2015-11-30 DIAGNOSIS — S2241XA Multiple fractures of ribs, right side, initial encounter for closed fracture: Principal | ICD-10-CM | POA: Diagnosis present

## 2015-11-30 DIAGNOSIS — Z87891 Personal history of nicotine dependence: Secondary | ICD-10-CM

## 2015-11-30 DIAGNOSIS — I10 Essential (primary) hypertension: Secondary | ICD-10-CM | POA: Diagnosis not present

## 2015-11-30 DIAGNOSIS — E785 Hyperlipidemia, unspecified: Secondary | ICD-10-CM | POA: Diagnosis present

## 2015-11-30 DIAGNOSIS — K7689 Other specified diseases of liver: Secondary | ICD-10-CM | POA: Diagnosis present

## 2015-11-30 DIAGNOSIS — T1490XA Injury, unspecified, initial encounter: Secondary | ICD-10-CM

## 2015-11-30 DIAGNOSIS — I471 Supraventricular tachycardia: Secondary | ICD-10-CM | POA: Diagnosis not present

## 2015-11-30 DIAGNOSIS — Z79899 Other long term (current) drug therapy: Secondary | ICD-10-CM | POA: Diagnosis not present

## 2015-11-30 DIAGNOSIS — M109 Gout, unspecified: Secondary | ICD-10-CM | POA: Diagnosis present

## 2015-11-30 DIAGNOSIS — I480 Paroxysmal atrial fibrillation: Secondary | ICD-10-CM | POA: Diagnosis not present

## 2015-11-30 DIAGNOSIS — Z79891 Long term (current) use of opiate analgesic: Secondary | ICD-10-CM | POA: Diagnosis not present

## 2015-11-30 DIAGNOSIS — S2231XA Fracture of one rib, right side, initial encounter for closed fracture: Secondary | ICD-10-CM

## 2015-11-30 DIAGNOSIS — S2249XA Multiple fractures of ribs, unspecified side, initial encounter for closed fracture: Secondary | ICD-10-CM | POA: Diagnosis present

## 2015-11-30 DIAGNOSIS — J90 Pleural effusion, not elsewhere classified: Secondary | ICD-10-CM | POA: Diagnosis present

## 2015-11-30 DIAGNOSIS — W11XXXA Fall on and from ladder, initial encounter: Secondary | ICD-10-CM | POA: Diagnosis not present

## 2015-11-30 DIAGNOSIS — Z8679 Personal history of other diseases of the circulatory system: Secondary | ICD-10-CM

## 2015-11-30 LAB — I-STAT CHEM 8, ED
BUN: 32 mg/dL — ABNORMAL HIGH (ref 6–20)
CALCIUM ION: 1.19 mmol/L (ref 1.15–1.40)
Chloride: 100 mmol/L — ABNORMAL LOW (ref 101–111)
Creatinine, Ser: 0.9 mg/dL (ref 0.61–1.24)
GLUCOSE: 124 mg/dL — AB (ref 65–99)
HCT: 54 % — ABNORMAL HIGH (ref 39.0–52.0)
HEMOGLOBIN: 18.4 g/dL — AB (ref 13.0–17.0)
Potassium: 4.3 mmol/L (ref 3.5–5.1)
Sodium: 141 mmol/L (ref 135–145)
TCO2: 32 mmol/L (ref 0–100)

## 2015-11-30 LAB — CBC WITH DIFFERENTIAL/PLATELET
Basophils Absolute: 0 10*3/uL (ref 0.0–0.1)
Basophils Relative: 0 %
EOS PCT: 0 %
Eosinophils Absolute: 0 10*3/uL (ref 0.0–0.7)
HCT: 47.6 % (ref 39.0–52.0)
Hemoglobin: 16.3 g/dL (ref 13.0–17.0)
LYMPHS ABS: 0.9 10*3/uL (ref 0.7–4.0)
LYMPHS PCT: 11 %
MCH: 32.3 pg (ref 26.0–34.0)
MCHC: 34.2 g/dL (ref 30.0–36.0)
MCV: 94.4 fL (ref 78.0–100.0)
MONO ABS: 0.7 10*3/uL (ref 0.1–1.0)
MONOS PCT: 9 %
Neutro Abs: 6.5 10*3/uL (ref 1.7–7.7)
Neutrophils Relative %: 80 %
PLATELETS: 169 10*3/uL (ref 150–400)
RBC: 5.04 MIL/uL (ref 4.22–5.81)
RDW: 13.5 % (ref 11.5–15.5)
WBC: 8.2 10*3/uL (ref 4.0–10.5)

## 2015-11-30 LAB — COMPREHENSIVE METABOLIC PANEL
ALT: 40 U/L (ref 17–63)
ANION GAP: 5 (ref 5–15)
AST: 28 U/L (ref 15–41)
Albumin: 3.6 g/dL (ref 3.5–5.0)
Alkaline Phosphatase: 45 U/L (ref 38–126)
BUN: 22 mg/dL — ABNORMAL HIGH (ref 6–20)
CALCIUM: 8.3 mg/dL — AB (ref 8.9–10.3)
CHLORIDE: 107 mmol/L (ref 101–111)
CO2: 26 mmol/L (ref 22–32)
CREATININE: 0.9 mg/dL (ref 0.61–1.24)
Glucose, Bld: 107 mg/dL — ABNORMAL HIGH (ref 65–99)
Potassium: 4 mmol/L (ref 3.5–5.1)
Sodium: 138 mmol/L (ref 135–145)
Total Bilirubin: 1 mg/dL (ref 0.3–1.2)
Total Protein: 6.3 g/dL — ABNORMAL LOW (ref 6.5–8.1)

## 2015-11-30 MED ORDER — INFLUENZA VAC SPLIT QUAD 0.5 ML IM SUSY
0.5000 mL | PREFILLED_SYRINGE | INTRAMUSCULAR | Status: AC
  Administered 2015-12-01: 0.5 mL via INTRAMUSCULAR
  Filled 2015-11-30: qty 0.5

## 2015-11-30 MED ORDER — IBUPROFEN 200 MG PO TABS: 400.0000 mg | ORAL_TABLET | Freq: Four times a day (QID) | ORAL | Status: DC | PRN

## 2015-11-30 MED ORDER — IOPAMIDOL (ISOVUE-300) INJECTION 61%
100.0000 mL | Freq: Once | INTRAVENOUS | Status: AC | PRN
  Administered 2015-11-30: 100 mL via INTRAVENOUS

## 2015-11-30 MED ORDER — ESMOLOL HCL-SODIUM CHLORIDE 2000 MG/100ML IV SOLN
25.0000 ug/kg/min | INTRAVENOUS | Status: DC
  Administered 2015-11-30: 23:00:00 via INTRAVENOUS
  Administered 2015-11-30: 25 ug/kg/min via INTRAVENOUS
  Administered 2015-12-01: 150 ug/kg/min via INTRAVENOUS
  Administered 2015-12-01: 175 ug/kg/min via INTRAVENOUS
  Administered 2015-12-01 (×3): 200 ug/kg/min via INTRAVENOUS
  Filled 2015-11-30 (×9): qty 100

## 2015-11-30 MED ORDER — ONDANSETRON HCL 4 MG/2ML IJ SOLN: 4.0000 mg | Freq: Four times a day (QID) | INTRAMUSCULAR | Status: DC | PRN

## 2015-11-30 MED ORDER — OXYCODONE HCL 5 MG PO TABS: 5.0000 mg | ORAL_TABLET | ORAL | Status: DC | PRN

## 2015-11-30 MED ORDER — ALLOPURINOL 100 MG PO TABS
100.0000 mg | ORAL_TABLET | Freq: Every day | ORAL | Status: DC
  Administered 2015-12-01 – 2015-12-05 (×5): 100 mg via ORAL
  Filled 2015-11-30 (×5): qty 1

## 2015-11-30 MED ORDER — ATENOLOL 50 MG PO TABS
50.0000 mg | ORAL_TABLET | Freq: Two times a day (BID) | ORAL | Status: DC
  Administered 2015-12-01 – 2015-12-03 (×4): 50 mg via ORAL
  Filled 2015-11-30 (×6): qty 1

## 2015-11-30 MED ORDER — SODIUM CHLORIDE 0.9 % IV SOLN
INTRAVENOUS | Status: DC
  Administered 2015-11-30: 18:00:00 via INTRAVENOUS

## 2015-11-30 MED ORDER — ONDANSETRON HCL 4 MG PO TABS: 4.0000 mg | ORAL_TABLET | Freq: Four times a day (QID) | ORAL | Status: DC | PRN

## 2015-11-30 MED ORDER — SODIUM CHLORIDE 0.9 % IV BOLUS (SEPSIS)
1000.0000 mL | Freq: Once | INTRAVENOUS | Status: AC
  Administered 2015-11-30: 1000 mL via INTRAVENOUS

## 2015-11-30 MED ORDER — HYDROMORPHONE HCL 1 MG/ML IJ SOLN
1.0000 mg | Freq: Once | INTRAMUSCULAR | Status: AC
  Administered 2015-11-30: 1 mg via INTRAVENOUS
  Filled 2015-11-30: qty 1

## 2015-11-30 MED ORDER — MORPHINE SULFATE (PF) 2 MG/ML IV SOLN
2.0000 mg | INTRAVENOUS | Status: DC | PRN
  Administered 2015-11-30 (×2): 6 mg via INTRAVENOUS
  Filled 2015-11-30 (×2): qty 3

## 2015-11-30 MED ORDER — MORPHINE SULFATE (PF) 2 MG/ML IV SOLN
6.0000 mg | Freq: Once | INTRAVENOUS | Status: AC
Start: 2015-11-30 — End: 2015-11-30
  Administered 2015-11-30: 6 mg via INTRAVENOUS
  Filled 2015-11-30: qty 3

## 2015-11-30 MED ORDER — MORPHINE SULFATE (PF) 2 MG/ML IV SOLN
4.0000 mg | Freq: Once | INTRAVENOUS | Status: AC
Start: 2015-11-30 — End: 2015-11-30
  Administered 2015-11-30: 4 mg via INTRAVENOUS
  Filled 2015-11-30: qty 2

## 2015-11-30 NOTE — Telephone Encounter (Signed)
Pt checked in to [facility] ED. Nothing further needed at this time.

## 2015-11-30 NOTE — ED Triage Notes (Signed)
Patient was on ladder working outside when ladder collapsed on him on Saturday. Patient was seen at [facility] walk in clinic and was diagnosed with right rib fractures.  Patient states that Percocets that was prescribed aren't helping with pain. Patient reports when he he changes positions pain radiates down to RLQ area.  Patient called PCP this morning and was sent here for further evaluation and MRI.  Patient wants to rule out any soft tissue damage and would like pain medication that will help make the pain more bearable.

## 2015-11-30 NOTE — Progress Notes (Signed)
Spoke with Dr. Cyndia Bent, he is aware of pt and said he has reviewed pt's chart/imaging. Sees no need for TCTS intervention at this time.  Spoke with Dr. Rosendo Gros regarding pt's chronic HTN (SBP currently 190s-200s). Gave orders for esmolol gtt. Will start promptly.  Pt resting comfortably in bed now. Will continue to monitor.  Henreitta Leber, RN 10:39 PM 11/30/15

## 2015-11-30 NOTE — ED Notes (Signed)
This RN received phone call from Dr Zella Richer stating he had filled out his part of EMTALA and he would like patient transferred to [facility] "sooner than later due to his aorta in jeopardy".

## 2015-11-30 NOTE — Progress Notes (Signed)
Attempted to speak with patient, Nurse was in room at the time.  Travis Pruitt [id number] ED CSW 11/30/2015 12:13 PM

## 2015-11-30 NOTE — ED Notes (Signed)
Report given to Rachel, RN.

## 2015-11-30 NOTE — Progress Notes (Signed)
Patient ID: Travis Pruitt, male   DOB: 03-01-1935, 80 y.o.   MRN: [id number]  CT surgery:  I will leave full consult note in the am. I reviewed his CT and discussed it with Dr. Zella Richer this am. He has a small filling defect in the wall of the aortic arch just opposite the origin of the left subclavian artery. Etiology of this is unclear but it is small. It is not a dissection or transection and there is no hematoma around the aorta. It could be an intramural hematoma but I would expect some thickening of the aortic wall there. It could be non-calcified plaque. It could be artifact. I don't think there is anything to do about it except keep his BP under good control and do a follow up scan in about a week.

## 2015-11-30 NOTE — ED Notes (Signed)
MD at bedside. 

## 2015-11-30 NOTE — ED Notes (Signed)
Pt transported to CT ?

## 2015-11-30 NOTE — Progress Notes (Signed)
Notified by RN of pt's arrival. Pt with HTN sys >200s.  No administration of HTN Rxs apparent at [facility] or on transfer Will start esmolol gtt at this time given questionable finding or aortic injury on CT. Asked RN to notify [facility] of arrival.

## 2015-11-30 NOTE — ED Provider Notes (Signed)
[facility] DEPT Provider Note   CSN: [id number] Arrival date & time: 11/30/15  1042     History   Chief Complaint Chief Complaint  Patient presents with  . Fall  . fractured ribs    HPI Travis Pruitt is a 80 y.o. male.  80 year old male fell off a ladder 2 days ago and on his right side approximately 15 feet. Went to urgent care was diagnosed with rib fractures and given Percocet however significant improvement in pain so called his doctor who sent him to the emergency department for further evaluation. Patient also now having some right-sided abdominal pain is different than his rib pain. No dyspnea or chest pain. Has a right elbow pain but no other musculoskeletal points.   The history is provided by the spouse and the patient.  Fall  This is a new problem. The current episode started 2 days ago. The problem has not changed since onset.Pertinent negatives include no chest pain and no shortness of breath. The symptoms are aggravated by walking (breathing). Nothing relieves the symptoms. He has tried nothing for the symptoms.    Past Medical History:  Diagnosis Date  . Anxiety   . Colon polyp   . COLONIC POLYPS, ADENOMATOUS, HX OF 11/11/2003   Annotation: COLONOSCOPY 10/05 ([facility]) COLONOSCOPY 04/11/07 Qualifier: Diagnosis of  By: Carlean Purl MD, Dimas Millin Diverticulosis   . Gout   . Hx of adenomatous colonic polyps 01/12/2015  . Hyperlipidemia   . Hypertension     Patient Active Problem List   Diagnosis Date Noted  . Multiple rib fractures 11/30/2015  . Hx of adenomatous colonic polyps 01/12/2015  . Generalized anxiety disorder 11/27/2007  . Hyperlipidemia 10/25/2006  . Gout 10/25/2006  . Essential hypertension 10/25/2006    Past Surgical History:  Procedure Laterality Date  . COLONOSCOPY    . INGUINAL HERNIA REPAIR         Home Medications    Prior to Admission medications   Medication Sig Start Date End Date Taking? Authorizing Provider    allopurinol (ZYLOPRIM) 100 MG tablet Take 100 mg by mouth daily.   Yes Historical Provider, MD  atenolol (TENORMIN) 50 MG tablet TAKE 1 TABLET (50 MG TOTAL) BY MOUTH 2 (TWO) TIMES DAILY. 09/16/15  Yes Marin Olp, MD  ibuprofen (ADVIL,MOTRIN) 200 MG tablet Take 400 mg by mouth every 6 (six) hours as needed for moderate pain.   Yes Historical Provider, MD  oxyCODONE-acetaminophen (PERCOCET/ROXICET) 5-325 MG tablet Take 1 tablet by mouth every 6 (six) hours as needed for moderate pain.  11/28/15  Yes Historical Provider, MD  guaiFENesin-codeine 100-10 MG/5ML syrup Take 5 mLs by mouth every 6 (six) hours as needed for cough (do not drive for 8 hours after taking). Patient not taking: Reported on 11/30/2015 06/08/15   Marin Olp, MD  sertraline (ZOLOFT) 50 MG tablet TAKE 1 TABLET (50 MG TOTAL) BY MOUTH DAILY. Patient not taking: Reported on 11/30/2015 09/11/15   Marin Olp, MD    Family History Family History  Problem Relation Age of Onset  . Cancer Mother     lung  . Colon cancer Neg Hx     Social History Social History  Substance Use Topics  . Smoking status: Former Smoker    Quit date: 07/22/1970  . Smokeless tobacco: Never Used     Comment: 50 years ago  . Alcohol use 4.2 oz/week    7 Shots of liquor per week  Allergies   Review of patient's allergies indicates no known allergies.   Review of Systems Review of Systems  Constitutional: Negative for chills and fever.  Eyes: Negative for pain.  Respiratory: Negative for shortness of breath.   Cardiovascular: Negative for chest pain.  All other systems reviewed and are negative.    Physical Exam Updated Vital Signs BP 176/75   Pulse (!) 53   Temp 97.5 F (36.4 C) (Oral)   Resp 19   SpO2 98%   Physical Exam  Constitutional: He is oriented to person, place, and time. He appears well-developed and well-nourished.  HENT:  Head: Normocephalic and atraumatic.  Eyes: Conjunctivae and EOM are normal.   Neck: Normal range of motion.  Cardiovascular: Normal rate.   Pulmonary/Chest: Effort normal. No respiratory distress. He has no wheezes. He exhibits tenderness (right lateral).  Abdominal: He exhibits no distension. There is tenderness (ruq).  Musculoskeletal: Normal range of motion.  Neurological: He is alert and oriented to person, place, and time.  Nursing note and vitals reviewed.    ED Treatments / Results  Labs (all labs ordered are listed, but only abnormal results are displayed) Labs Reviewed  I-STAT CHEM 8, ED - Abnormal; Notable for the following:       Result Value   Chloride 100 (*)    BUN 32 (*)    Glucose, Bld 124 (*)    Hemoglobin 18.4 (*)    HCT 54.0 (*)    All other components within normal limits  CBC WITH DIFFERENTIAL/PLATELET  COMPREHENSIVE METABOLIC PANEL    EKG  EKG Interpretation None       Radiology Ct Chest W Contrast  Result Date: 11/30/2015 CLINICAL DATA:  Fall from ladder EXAM: CT CHEST, ABDOMEN, AND PELVIS WITH CONTRAST TECHNIQUE: Multidetector CT imaging of the chest, abdomen and pelvis was performed following the standard protocol during bolus administration of intravenous contrast. CONTRAST:  137mL ISOVUE-300 IOPAMIDOL (ISOVUE-300) INJECTION 61% COMPARISON:  None. FINDINGS: CT CHEST FINDINGS Cardiovascular: There is no mediastinal hematoma. There is mild atherosclerotic calcification in the aorta. A focal area of decreased attenuation is noted in the aortic arch just distal to the left subclavian artery measuring 6 x 6 mm, possibly a small intramural hematoma. It is best seen on coronal slice 37 series 4 and sagittal slice 123456 series 5. No other mucosal lesions are evident in the aorta on this study. Pericardium is not thickened. No major vessel pulmonary embolus is evident. Mediastinum/Nodes: Thyroid appears unremarkable. There is no evident thoracic adenopathy. There are subcentimeter lymph nodes which do not meet size criteria for pathologic  significance. There is a focal hiatal hernia. Lungs/Pleura: There is a right pleural effusion with atelectasis/ consolidation in the right lower lobe, primarily in the superior and posterior segments. There is mild atelectasis in the left base. There is a minimal pneumothorax on the right in the medial apex region. Somewhat greater soft tissue air is noted on the right. The lungs elsewhere are clear. Musculoskeletal: There are fractures of the lateral right ninth, tenth, and eleventh ribs and posterior right twelfth rib, not significantly displaced. No other fractures are evident. Soft tissue air is noted on the right. There is degenerative change in the lower thoracic spine. CT ABDOMEN PELVIS FINDINGS Hepatobiliary: Multiple cysts are noted throughout the liver. Cysts range in size from as small as 3 mm to as large as 4.0 x 3.6 cm. This largest cyst is in the anterior segment right lobe near the dome. There is  no perihepatic fluid. No liver laceration or rupture is evident. There is mild fatty infiltration near the fissure for the ligamentum teres. Gallbladder wall is not appreciably thickened. There is no biliary duct dilatation. Pancreas: No pancreatic mass or inflammatory focus is evident. There is no peripancreatic fluid. Spleen: Spleen appears intact without laceration or rupture. No focal splenic lesions are identified. There is no perisplenic fluid. Adrenals/Urinary Tract: Right adrenal is normal. There is a 1.1 x 1.0 cm left adrenal adenoma. There is a cyst arising from the lateral left kidney measuring 6.9 x 6.0 cm. No other focal renal lesions are identified. There is no hydronephrosis on either side. No renal laceration or rupture is evident. There is no perinephric fluid. No renal or ureteral calculus is identified. Urinary bladder is midline with wall thickness within normal limits. Stomach/Bowel: There are multiple colonic diverticulum, most notably in the distal transverse colon, descending colon,  and splenic flexure regions. No evidence of diverticulitis. There is no bowel wall or mesenteric thickening. No bowel obstruction. No free air or portal venous air is evident. Vascular/Lymphatic: There is mild atherosclerotic calcification in the aorta. There is no abdominal aortic aneurysm. Major mesenteric vessels appear intact. There is no periaortic fluid. No adenopathy is evident in the abdomen or pelvis. Reproductive: Prostate and seminal vesicles appear unremarkable. No pelvic mass or pelvic fluid collection is evident. Other: There is fat in each inguinal ring. Appendix appears normal. No ascites or abscess is evident in the abdomen or pelvis. There is no abnormal peritoneal or retroperitoneal stranding or fluid collection. Musculoskeletal: There is degenerative change in the lumbar spine. No fractures are noted in the bones of the abdomen and pelvis. There is no intramuscular or abdominal wall region. IMPRESSION: CT chest: Fractures of the right ninth, tenth, eleventh, and twelfth ribs with small right apical pneumothorax and moderate soft tissue air laterally on the right. There is a right pleural effusion with consolidation/ atelectasis in the right base. There is mild posterior left base atelectasis. There is a small focus of decreased attenuation in the aortic arch, possibly a small intramural hematoma. No dissection or transsection seen. No mediastinal hematoma evident. No aneurysm. There is a focal hiatal hernia.  No adenopathy. CT abdomen and pelvis: Multiple liver cysts. Sizable cyst arising from left kidney. No traumatic appearing lesion identified in the abdomen or pelvis. Multiple colonic diverticula without diverticulitis. No inflammatory focus. No bowel obstruction. Appendix appears normal. No abscess. No fractures evident in the bones of the abdomen and pelvis. Small left adrenal adenoma. Critical Value/emergent results were called by telephone at the time of interpretation on 11/30/2015 at  1:42 pm to Dr. Merrily Pew , who verbally acknowledged these results. Electronically Signed   By: Lowella Grip III M.D.   On: 11/30/2015 13:42   Ct Abdomen Pelvis W Contrast  Result Date: 11/30/2015 CLINICAL DATA:  Fall from ladder EXAM: CT CHEST, ABDOMEN, AND PELVIS WITH CONTRAST TECHNIQUE: Multidetector CT imaging of the chest, abdomen and pelvis was performed following the standard protocol during bolus administration of intravenous contrast. CONTRAST:  148mL ISOVUE-300 IOPAMIDOL (ISOVUE-300) INJECTION 61% COMPARISON:  None. FINDINGS: CT CHEST FINDINGS Cardiovascular: There is no mediastinal hematoma. There is mild atherosclerotic calcification in the aorta. A focal area of decreased attenuation is noted in the aortic arch just distal to the left subclavian artery measuring 6 x 6 mm, possibly a small intramural hematoma. It is best seen on coronal slice 37 series 4 and sagittal slice 123456 series 5. No  other mucosal lesions are evident in the aorta on this study. Pericardium is not thickened. No major vessel pulmonary embolus is evident. Mediastinum/Nodes: Thyroid appears unremarkable. There is no evident thoracic adenopathy. There are subcentimeter lymph nodes which do not meet size criteria for pathologic significance. There is a focal hiatal hernia. Lungs/Pleura: There is a right pleural effusion with atelectasis/ consolidation in the right lower lobe, primarily in the superior and posterior segments. There is mild atelectasis in the left base. There is a minimal pneumothorax on the right in the medial apex region. Somewhat greater soft tissue air is noted on the right. The lungs elsewhere are clear. Musculoskeletal: There are fractures of the lateral right ninth, tenth, and eleventh ribs and posterior right twelfth rib, not significantly displaced. No other fractures are evident. Soft tissue air is noted on the right. There is degenerative change in the lower thoracic spine. CT ABDOMEN PELVIS  FINDINGS Hepatobiliary: Multiple cysts are noted throughout the liver. Cysts range in size from as small as 3 mm to as large as 4.0 x 3.6 cm. This largest cyst is in the anterior segment right lobe near the dome. There is no perihepatic fluid. No liver laceration or rupture is evident. There is mild fatty infiltration near the fissure for the ligamentum teres. Gallbladder wall is not appreciably thickened. There is no biliary duct dilatation. Pancreas: No pancreatic mass or inflammatory focus is evident. There is no peripancreatic fluid. Spleen: Spleen appears intact without laceration or rupture. No focal splenic lesions are identified. There is no perisplenic fluid. Adrenals/Urinary Tract: Right adrenal is normal. There is a 1.1 x 1.0 cm left adrenal adenoma. There is a cyst arising from the lateral left kidney measuring 6.9 x 6.0 cm. No other focal renal lesions are identified. There is no hydronephrosis on either side. No renal laceration or rupture is evident. There is no perinephric fluid. No renal or ureteral calculus is identified. Urinary bladder is midline with wall thickness within normal limits. Stomach/Bowel: There are multiple colonic diverticulum, most notably in the distal transverse colon, descending colon, and splenic flexure regions. No evidence of diverticulitis. There is no bowel wall or mesenteric thickening. No bowel obstruction. No free air or portal venous air is evident. Vascular/Lymphatic: There is mild atherosclerotic calcification in the aorta. There is no abdominal aortic aneurysm. Major mesenteric vessels appear intact. There is no periaortic fluid. No adenopathy is evident in the abdomen or pelvis. Reproductive: Prostate and seminal vesicles appear unremarkable. No pelvic mass or pelvic fluid collection is evident. Other: There is fat in each inguinal ring. Appendix appears normal. No ascites or abscess is evident in the abdomen or pelvis. There is no abnormal peritoneal or  retroperitoneal stranding or fluid collection. Musculoskeletal: There is degenerative change in the lumbar spine. No fractures are noted in the bones of the abdomen and pelvis. There is no intramuscular or abdominal wall region. IMPRESSION: CT chest: Fractures of the right ninth, tenth, eleventh, and twelfth ribs with small right apical pneumothorax and moderate soft tissue air laterally on the right. There is a right pleural effusion with consolidation/ atelectasis in the right base. There is mild posterior left base atelectasis. There is a small focus of decreased attenuation in the aortic arch, possibly a small intramural hematoma. No dissection or transsection seen. No mediastinal hematoma evident. No aneurysm. There is a focal hiatal hernia.  No adenopathy. CT abdomen and pelvis: Multiple liver cysts. Sizable cyst arising from left kidney. No traumatic appearing lesion identified in the  abdomen or pelvis. Multiple colonic diverticula without diverticulitis. No inflammatory focus. No bowel obstruction. Appendix appears normal. No abscess. No fractures evident in the bones of the abdomen and pelvis. Small left adrenal adenoma. Critical Value/emergent results were called by telephone at the time of interpretation on 11/30/2015 at 1:42 pm to Dr. Merrily Pew , who verbally acknowledged these results. Electronically Signed   By: Lowella Grip III M.D.   On: 11/30/2015 13:42    Procedures Procedures (including critical care time)  Medications Ordered in ED Medications  allopurinol (ZYLOPRIM) tablet 100 mg (not administered)  ibuprofen (ADVIL,MOTRIN) tablet 400 mg (not administered)  atenolol (TENORMIN) tablet 50 mg (not administered)  0.9 %  sodium chloride infusion (not administered)  oxyCODONE (Oxy IR/ROXICODONE) immediate release tablet 5 mg (not administered)  morphine 2 MG/ML injection 2-6 mg (not administered)  ondansetron (ZOFRAN) tablet 4 mg (not administered)    Or  ondansetron (ZOFRAN)  injection 4 mg (not administered)  morphine 2 MG/ML injection 6 mg (6 mg Intravenous Given 11/30/15 1212)  morphine 2 MG/ML injection 4 mg (4 mg Intravenous Given 11/30/15 1255)  iopamidol (ISOVUE-300) 61 % injection 100 mL (100 mLs Intravenous Contrast Given 11/30/15 1315)  HYDROmorphone (DILAUDID) injection 1 mg (1 mg Intravenous Given 11/30/15 1439)  sodium chloride 0.9 % bolus 1,000 mL (1,000 mLs Intravenous New Bag/Given 11/30/15 1444)     Initial Impression / Assessment and Plan / ED Course  I have reviewed the triage vital signs and the nursing notes.  Pertinent labs & imaging results that were available during my care of the patient were reviewed by me and considered in my medical decision making (see chart for details).  Clinical Course   Fall a couple days ago, persistent rib pain and now havign abdomen as well so will ct to ensure no internal injuries. Pain control in mean time.   CT scan with multiple rib fractures, small pneumothorax and concern for intramural hematoma on aortic arch. D/W surgery who will plan for transfer and admission for CT surgery evaluation.   Surgery to transfer for admission. CT spoken with by myself and will see as consult.   Final Clinical Impressions(s) / ED Diagnoses   Final diagnoses:  Trauma  Hepatic cyst  Renal cyst  Closed fracture of multiple ribs of right side, initial encounter  Traumatic pneumothorax, initial encounter    New Prescriptions New Prescriptions   No medications on file     Merrily Pew, MD 11/30/15 1544

## 2015-11-30 NOTE — ED Notes (Signed)
[facility] partner- Creating password to allow to receive patient information.   Password- [employer]

## 2015-11-30 NOTE — ED Notes (Signed)
Nurse is in the room collecting labs 

## 2015-11-30 NOTE — ED Notes (Signed)
Second attempt made to call report, RN unable to take report at this time, will return call in few minutes.

## 2015-11-30 NOTE — H&P (Signed)
[facility] Admission Note  Travis Pruitt April 11, 1935  [id number].    Requesting MD: Merrily Pew Chief Complaint/Reason for Consult: Fall  HPI:  Travis Pruitt is a 80yo male who presented to [facility] earlier today complaining of right sided chest and abdominal pain. Patient states that he fell about 15 feet off a ladder on Saturday and landed on his right side. Does not think that he hit his head or had any loss of consciousness, but he's not sure. Seen in urgent care over the weekend and found to have multiple right rib fractures. He was given percocet to take but this did not help his pain. Pain got worse therefore he came to the ED for evaluation. Pain is located in his right lower side and lower abdomen. It is constant but worse with movement and deep inspiration. Denies CP, SOB, headache, neck pain, dizziness, nausea, or vomiting.  PMH significant for HTN, bradycardia, gout Denies prior h/o abdominal surgery Denies home use of anticoagulants Nonsmoker  ROS: All systems reviewed and otherwise negative except for as above  Family History  Problem Relation Age of Onset  . Cancer Mother     lung  . Colon cancer Neg Hx     Past Medical History:  Diagnosis Date  . Anxiety   . Colon polyp   . COLONIC POLYPS, ADENOMATOUS, HX OF 11/11/2003   Annotation: COLONOSCOPY 10/05 ([facility]) COLONOSCOPY 04/11/07 Qualifier: Diagnosis of  By: Carlean Purl MD, Dimas Millin Diverticulosis   . Gout   . Hx of adenomatous colonic polyps 01/12/2015  . Hyperlipidemia   . Hypertension     Past Surgical History:  Procedure Laterality Date  . COLONOSCOPY    . INGUINAL HERNIA REPAIR      Social History:  reports that he quit smoking about 45 years ago. He has never used smokeless tobacco. He reports that he drinks about 4.2 oz of alcohol per week . He reports that he does not use drugs.  Allergies: No Known Allergies   (Not in a hospital admission)  Blood pressure  167/79, pulse (!) 58, temperature 97.5 F (36.4 C), temperature source Oral, resp. rate 14, SpO2 93 %. Physical Exam: General: pleasant, WD/WN white male who is laying in bed in NAD HEENT: head is normocephalic, atraumatic.  PERRL. Sclera are noninjected.  Mouth is pink and moist Heart: normal rhythm, bradycardic.  No obvious murmurs, gallops, or rubs noted.  Palpable pedal pulses bilaterally Lungs: CTAB, no wheezes, rhonchi, or rales noted.  Respiratory effort nonlabored.  Abd: soft, ND, +BS, no masses, hernias, or organomegaly. No ecchymosis. TTP RLQ and right lower ribs. MS: all 4 extremities are symmetrical with no cyanosis, clubbing, or edema. Small superficial abrasion right elbow. Entire length of spine nontender Skin: warm and dry with no masses, lesions, or rashes Psych: A&Ox3 with an appropriate affect. Neuro: CM 2-12 intact, extremity CSM intact bilaterally, normal speech  Results for orders placed or performed during the hospital encounter of 11/30/15 (from the past 48 hour(s))  I-Stat Chem 8, ED     Status: Abnormal   Collection Time: 11/30/15 12:26 PM  Result Value Ref Range   Sodium 141 135 - 145 mmol/L   Potassium 4.3 3.5 - 5.1 mmol/L   Chloride 100 (L) 101 - 111 mmol/L   BUN 32 (H) 6 - 20 mg/dL   Creatinine, Ser 0.90 0.61 - 1.24 mg/dL   Glucose, Bld 124 (H) 65 - 99 mg/dL   Calcium, Ion  1.19 1.15 - 1.40 mmol/L   TCO2 32 0 - 100 mmol/L   Hemoglobin 18.4 (H) 13.0 - 17.0 g/dL   HCT 54.0 (H) 39.0 - 52.0 %   Ct Chest W Contrast  Result Date: 11/30/2015 CLINICAL DATA:  Fall from ladder EXAM: CT CHEST, ABDOMEN, AND PELVIS WITH CONTRAST TECHNIQUE: Multidetector CT imaging of the chest, abdomen and pelvis was performed following the standard protocol during bolus administration of intravenous contrast. CONTRAST:  135mL ISOVUE-300 IOPAMIDOL (ISOVUE-300) INJECTION 61% COMPARISON:  None. FINDINGS: CT CHEST FINDINGS Cardiovascular: There is no mediastinal hematoma. There is mild  atherosclerotic calcification in the aorta. A focal area of decreased attenuation is noted in the aortic arch just distal to the left subclavian artery measuring 6 x 6 mm, possibly a small intramural hematoma. It is best seen on coronal slice 37 series 4 and sagittal slice 123456 series 5. No other mucosal lesions are evident in the aorta on this study. Pericardium is not thickened. No major vessel pulmonary embolus is evident. Mediastinum/Nodes: Thyroid appears unremarkable. There is no evident thoracic adenopathy. There are subcentimeter lymph nodes which do not meet size criteria for pathologic significance. There is a focal hiatal hernia. Lungs/Pleura: There is a right pleural effusion with atelectasis/ consolidation in the right lower lobe, primarily in the superior and posterior segments. There is mild atelectasis in the left base. There is a minimal pneumothorax on the right in the medial apex region. Somewhat greater soft tissue air is noted on the right. The lungs elsewhere are clear. Musculoskeletal: There are fractures of the lateral right ninth, tenth, and eleventh ribs and posterior right twelfth rib, not significantly displaced. No other fractures are evident. Soft tissue air is noted on the right. There is degenerative change in the lower thoracic spine. CT ABDOMEN PELVIS FINDINGS Hepatobiliary: Multiple cysts are noted throughout the liver. Cysts range in size from as small as 3 mm to as large as 4.0 x 3.6 cm. This largest cyst is in the anterior segment right lobe near the dome. There is no perihepatic fluid. No liver laceration or rupture is evident. There is mild fatty infiltration near the fissure for the ligamentum teres. Gallbladder wall is not appreciably thickened. There is no biliary duct dilatation. Pancreas: No pancreatic mass or inflammatory focus is evident. There is no peripancreatic fluid. Spleen: Spleen appears intact without laceration or rupture. No focal splenic lesions are  identified. There is no perisplenic fluid. Adrenals/Urinary Tract: Right adrenal is normal. There is a 1.1 x 1.0 cm left adrenal adenoma. There is a cyst arising from the lateral left kidney measuring 6.9 x 6.0 cm. No other focal renal lesions are identified. There is no hydronephrosis on either side. No renal laceration or rupture is evident. There is no perinephric fluid. No renal or ureteral calculus is identified. Urinary bladder is midline with wall thickness within normal limits. Stomach/Bowel: There are multiple colonic diverticulum, most notably in the distal transverse colon, descending colon, and splenic flexure regions. No evidence of diverticulitis. There is no bowel wall or mesenteric thickening. No bowel obstruction. No free air or portal venous air is evident. Vascular/Lymphatic: There is mild atherosclerotic calcification in the aorta. There is no abdominal aortic aneurysm. Major mesenteric vessels appear intact. There is no periaortic fluid. No adenopathy is evident in the abdomen or pelvis. Reproductive: Prostate and seminal vesicles appear unremarkable. No pelvic mass or pelvic fluid collection is evident. Other: There is fat in each inguinal ring. Appendix appears normal. No ascites or  abscess is evident in the abdomen or pelvis. There is no abnormal peritoneal or retroperitoneal stranding or fluid collection. Musculoskeletal: There is degenerative change in the lumbar spine. No fractures are noted in the bones of the abdomen and pelvis. There is no intramuscular or abdominal wall region. IMPRESSION: CT chest: Fractures of the right ninth, tenth, eleventh, and twelfth ribs with small right apical pneumothorax and moderate soft tissue air laterally on the right. There is a right pleural effusion with consolidation/ atelectasis in the right base. There is mild posterior left base atelectasis. There is a small focus of decreased attenuation in the aortic arch, possibly a small intramural hematoma.  No dissection or transsection seen. No mediastinal hematoma evident. No aneurysm. There is a focal hiatal hernia.  No adenopathy. CT abdomen and pelvis: Multiple liver cysts. Sizable cyst arising from left kidney. No traumatic appearing lesion identified in the abdomen or pelvis. Multiple colonic diverticula without diverticulitis. No inflammatory focus. No bowel obstruction. Appendix appears normal. No abscess. No fractures evident in the bones of the abdomen and pelvis. Small left adrenal adenoma. Critical Value/emergent results were called by telephone at the time of interpretation on 11/30/2015 at 1:42 pm to Dr. Merrily Pew , who verbally acknowledged these results. Electronically Signed   By: Lowella Grip III M.D.   On: 11/30/2015 13:42   Ct Abdomen Pelvis W Contrast  Result Date: 11/30/2015 CLINICAL DATA:  Fall from ladder EXAM: CT CHEST, ABDOMEN, AND PELVIS WITH CONTRAST TECHNIQUE: Multidetector CT imaging of the chest, abdomen and pelvis was performed following the standard protocol during bolus administration of intravenous contrast. CONTRAST:  139mL ISOVUE-300 IOPAMIDOL (ISOVUE-300) INJECTION 61% COMPARISON:  None. FINDINGS: CT CHEST FINDINGS Cardiovascular: There is no mediastinal hematoma. There is mild atherosclerotic calcification in the aorta. A focal area of decreased attenuation is noted in the aortic arch just distal to the left subclavian artery measuring 6 x 6 mm, possibly a small intramural hematoma. It is best seen on coronal slice 37 series 4 and sagittal slice 123456 series 5. No other mucosal lesions are evident in the aorta on this study. Pericardium is not thickened. No major vessel pulmonary embolus is evident. Mediastinum/Nodes: Thyroid appears unremarkable. There is no evident thoracic adenopathy. There are subcentimeter lymph nodes which do not meet size criteria for pathologic significance. There is a focal hiatal hernia. Lungs/Pleura: There is a right pleural effusion with  atelectasis/ consolidation in the right lower lobe, primarily in the superior and posterior segments. There is mild atelectasis in the left base. There is a minimal pneumothorax on the right in the medial apex region. Somewhat greater soft tissue air is noted on the right. The lungs elsewhere are clear. Musculoskeletal: There are fractures of the lateral right ninth, tenth, and eleventh ribs and posterior right twelfth rib, not significantly displaced. No other fractures are evident. Soft tissue air is noted on the right. There is degenerative change in the lower thoracic spine. CT ABDOMEN PELVIS FINDINGS Hepatobiliary: Multiple cysts are noted throughout the liver. Cysts range in size from as small as 3 mm to as large as 4.0 x 3.6 cm. This largest cyst is in the anterior segment right lobe near the dome. There is no perihepatic fluid. No liver laceration or rupture is evident. There is mild fatty infiltration near the fissure for the ligamentum teres. Gallbladder wall is not appreciably thickened. There is no biliary duct dilatation. Pancreas: No pancreatic mass or inflammatory focus is evident. There is no peripancreatic fluid. Spleen: Spleen  appears intact without laceration or rupture. No focal splenic lesions are identified. There is no perisplenic fluid. Adrenals/Urinary Tract: Right adrenal is normal. There is a 1.1 x 1.0 cm left adrenal adenoma. There is a cyst arising from the lateral left kidney measuring 6.9 x 6.0 cm. No other focal renal lesions are identified. There is no hydronephrosis on either side. No renal laceration or rupture is evident. There is no perinephric fluid. No renal or ureteral calculus is identified. Urinary bladder is midline with wall thickness within normal limits. Stomach/Bowel: There are multiple colonic diverticulum, most notably in the distal transverse colon, descending colon, and splenic flexure regions. No evidence of diverticulitis. There is no bowel wall or mesenteric  thickening. No bowel obstruction. No free air or portal venous air is evident. Vascular/Lymphatic: There is mild atherosclerotic calcification in the aorta. There is no abdominal aortic aneurysm. Major mesenteric vessels appear intact. There is no periaortic fluid. No adenopathy is evident in the abdomen or pelvis. Reproductive: Prostate and seminal vesicles appear unremarkable. No pelvic mass or pelvic fluid collection is evident. Other: There is fat in each inguinal ring. Appendix appears normal. No ascites or abscess is evident in the abdomen or pelvis. There is no abnormal peritoneal or retroperitoneal stranding or fluid collection. Musculoskeletal: There is degenerative change in the lumbar spine. No fractures are noted in the bones of the abdomen and pelvis. There is no intramuscular or abdominal wall region. IMPRESSION: CT chest: Fractures of the right ninth, tenth, eleventh, and twelfth ribs with small right apical pneumothorax and moderate soft tissue air laterally on the right. There is a right pleural effusion with consolidation/ atelectasis in the right base. There is mild posterior left base atelectasis. There is a small focus of decreased attenuation in the aortic arch, possibly a small intramural hematoma. No dissection or transsection seen. No mediastinal hematoma evident. No aneurysm. There is a focal hiatal hernia.  No adenopathy. CT abdomen and pelvis: Multiple liver cysts. Sizable cyst arising from left kidney. No traumatic appearing lesion identified in the abdomen or pelvis. Multiple colonic diverticula without diverticulitis. No inflammatory focus. No bowel obstruction. Appendix appears normal. No abscess. No fractures evident in the bones of the abdomen and pelvis. Small left adrenal adenoma. Critical Value/emergent results were called by telephone at the time of interpretation on 11/30/2015 at 1:42 pm to Dr. Merrily Pew , who verbally acknowledged these results. Electronically Signed   By:  Lowella Grip III M.D.   On: 11/30/2015 13:42      Assessment/Plan Fall  - fell ~15 feet off ladder on 11/28/15 - CT: Fractures of the right ninth, tenth, eleventh, and twelfth ribs with small right apical pneumothorax and moderate soft tissue air laterally on the right. There is a right pleural effusion with consolidation/ atelectasis in the right base. There is mild posterior left base atelectasis. There is a small focus of decreased attenuation in the aortic arch, possibly a small intramural hematoma. No dissection or transsection seen. No mediastinal hematoma evident. No aneurysm.  Right rib fractures 9-12 - pulmonary toilet and pain control Small right pneumothorax - stable x48 hours. Repeat XR in AM Small intramural hematoma - consulting CT surgery for recommendations  HTN- atenolol  Plan - transfer to [facility] for evaluation by CT surgery. Recommend pulmonary toilet for rib fractures. Repeat CXR and CBC in AM.  Jerrye Beavers, [facility] 11/30/2015, 2:10 PM Pager: [phone] Consults: [phone] Mon-Fri 7:00 am-4:30 pm Sat-Sun 7:00 am-11:30 am

## 2015-11-30 NOTE — Telephone Encounter (Signed)
Patient Name: Travis Pruitt DOB: [birth year] Initial Comment Caller says, injured himself over the weekend, Dx with broken ribs, he has chest pain and abd pains as well. He went to [place] walk in clinic. Abd pains severe pain. Nurse Assessment Nurse: Ronnald Ramp, RN, [place] Date/Time (Eastern Time): 11/30/2015 9:17:15 AM Confirm and document reason for call. If symptomatic, describe symptoms. You must click the next button to save text entered. ---Caller states yesterday, he fell off a ladder about 15 feet. He was seen in UC and diagnosed with broken ribs. Told to follow up with PCP today. He is having pain in his right upper abdomen that is worse. Has the patient traveled out of the country within the last 30 days? ---Not Applicable Does the patient have any new or worsening symptoms? ---Yes Will a triage be completed? ---Yes Related visit to physician within the last 2 weeks? ---Yes Does the PT have any chronic conditions? (i.e. diabetes, asthma, etc.) ---No Is this a behavioral health or substance abuse call? ---No Guidelines Guideline Title Affirmed Question Affirmed Notes Abdominal Injury [1] Can't take a deep breath BUT [2] no respiratory distress Final Disposition User Go to ED Now Ronnald Ramp, RN, [facility] - ED Disagree/Comply: Comply

## 2015-12-01 ENCOUNTER — Encounter: Payer: 59 | Admitting: Family Medicine

## 2015-12-01 ENCOUNTER — Inpatient Hospital Stay (HOSPITAL_COMMUNITY): Payer: Medicare Other

## 2015-12-01 DIAGNOSIS — S2241XA Multiple fractures of ribs, right side, initial encounter for closed fracture: Secondary | ICD-10-CM

## 2015-12-01 LAB — CBC
HEMATOCRIT: 46.9 % (ref 39.0–52.0)
Hemoglobin: 15.5 g/dL (ref 13.0–17.0)
MCH: 32.2 pg (ref 26.0–34.0)
MCHC: 33 g/dL (ref 30.0–36.0)
MCV: 97.5 fL (ref 78.0–100.0)
Platelets: 140 10*3/uL — ABNORMAL LOW (ref 150–400)
RBC: 4.81 MIL/uL (ref 4.22–5.81)
RDW: 13.1 % (ref 11.5–15.5)
WBC: 7.2 10*3/uL (ref 4.0–10.5)

## 2015-12-01 LAB — MRSA PCR SCREENING: MRSA by PCR: NEGATIVE

## 2015-12-01 LAB — BASIC METABOLIC PANEL
ANION GAP: 6 (ref 5–15)
BUN: 18 mg/dL (ref 6–20)
CO2: 27 mmol/L (ref 22–32)
Calcium: 8.7 mg/dL — ABNORMAL LOW (ref 8.9–10.3)
Chloride: 107 mmol/L (ref 101–111)
Creatinine, Ser: 0.85 mg/dL (ref 0.61–1.24)
GFR calc Af Amer: >60 mL/min (ref >60)
GFR calc non Af Amer: >60 mL/min (ref >60)
GLUCOSE: 105 mg/dL — AB (ref 65–99)
POTASSIUM: 4.7 mmol/L (ref 3.5–5.1)
Sodium: 140 mmol/L (ref 135–145)

## 2015-12-01 MED ORDER — TRAMADOL HCL 50 MG PO TABS
50.0000 mg | ORAL_TABLET | Freq: Four times a day (QID) | ORAL | Status: DC | PRN
  Administered 2015-12-01 – 2015-12-02 (×2): 50 mg via ORAL
  Filled 2015-12-01 (×2): qty 1

## 2015-12-01 MED ORDER — ORAL CARE MOUTH RINSE
15.0000 mL | Freq: Two times a day (BID) | OROMUCOSAL | Status: DC
  Administered 2015-12-03 – 2015-12-04 (×3): 15 mL via OROMUCOSAL

## 2015-12-01 MED ORDER — HYDRALAZINE HCL 20 MG/ML IJ SOLN
10.0000 mg | Freq: Four times a day (QID) | INTRAMUSCULAR | Status: DC | PRN
  Administered 2015-12-01 – 2015-12-04 (×7): 10 mg via INTRAVENOUS
  Filled 2015-12-01 (×7): qty 1

## 2015-12-01 NOTE — Consult Note (Signed)
[address]       [place] [address]             [phone]      Cardiothoracic Surgery Consultation  Reason for Consult: s/p fall with multiple rib fractures and possible aortic arch intramural hematoma Referring Physician: Dr. Corene Cornea is an 80 y.o. male.  HPI:   The patient is a 80 year old gentleman who fell about 15 ft off a ladder on Saturday landing on his right side. He was seen in urgent care over the weekend and found to have multiple right rib fractures treated with Percocet. This did not relieve his pain and it got worse prompting an ER visit yesterday am to [facility]. He had a CTA of the chest and abdomen which showed fractures of the right 9th, 10th, 11th, and 12th ribs with a small right apical ptx and some soft tissue air laterally on the right. There was a small right pleural effusion and atelectasis or consolidation at the right base with mild posterior left base atelectasis. There was also a small focus of decreased attenuation in the aortic arch across from the origin of the left subclavian artery with no dissection or transection, no aortic wall thickening and no mediastinal hematoma. The abdominal CT showed multiple liver cysts, a left renal cyst and a small left adrenal adenoma. He spent the day yesterday in the [facility] ER and was transferred to the 2S ICU last night late. Once he arrived he was noted to be very hypertensive and was started on esmolol. He says that his BP at home is usually 130's but he was in a lot of pain last night. His only pain has been in the right side over the lower chest wall. He denies any upper back, substernal or abdominal pain.  Past Medical History:  Diagnosis Date  . Anxiety   . Colon polyp   . COLONIC POLYPS, ADENOMATOUS, HX OF 11/11/2003   Annotation: COLONOSCOPY 10/05 ([facility]) COLONOSCOPY 04/11/07 Qualifier: Diagnosis of  By: Carlean Purl MD, Dimas Millin Diverticulosis   . Gout   . Hx of  adenomatous colonic polyps 01/12/2015  . Hyperlipidemia   . Hypertension     Past Surgical History:  Procedure Laterality Date  . COLONOSCOPY    . INGUINAL HERNIA REPAIR      Family History  Problem Relation Age of Onset  . Cancer Mother     lung  . Colon cancer Neg Hx     Social History:  reports that he quit smoking about 45 years ago. He has never used smokeless tobacco. He reports that he drinks about 4.2 oz of alcohol per week . He reports that he does not use drugs.  Allergies: No Known Allergies  Medications:  I have reviewed the patient's current medications. Prior to Admission:  Prescriptions Prior to Admission  Medication Sig Dispense Refill Last Dose  . allopurinol (ZYLOPRIM) 100 MG tablet Take 100 mg by mouth daily.   11/29/2015 at Unknown time  . atenolol (TENORMIN) 50 MG tablet TAKE 1 TABLET (50 MG TOTAL) BY MOUTH 2 (TWO) TIMES DAILY. 180 tablet 2 11/30/2015 at 930am  . ibuprofen (ADVIL,MOTRIN) 200 MG tablet Take 400 mg by mouth every 6 (six) hours as needed for moderate pain.   Past Week at Unknown time  . oxyCODONE-acetaminophen (PERCOCET/ROXICET) 5-325 MG tablet Take 1 tablet by mouth every 6 (six) hours as needed for moderate pain.  11/30/2015 at Unknown time  . guaiFENesin-codeine 100-10 MG/5ML syrup Take 5 mLs by mouth every 6 (six) hours as needed for cough (do not drive for 8 hours after taking). (Patient not taking: Reported on 11/30/2015) 118 mL 0 Not Taking at Unknown time  . sertraline (ZOLOFT) 50 MG tablet TAKE 1 TABLET (50 MG TOTAL) BY MOUTH DAILY. (Patient not taking: Reported on 11/30/2015) 90 tablet 1 Not Taking at Unknown time   Scheduled: . allopurinol  100 mg Oral Daily  . atenolol  50 mg Oral BID  . mouth rinse  15 mL Mouth Rinse BID   Continuous: . sodium chloride 10 mL/hr at 12/01/15 1100  . esmolol 125 mcg/kg/min (12/01/15 1100)   TFT:DDUKGURKY, morphine injection, ondansetron **OR** ondansetron (ZOFRAN) IV, oxyCODONE,  traMADol Anti-infectives    None      Results for orders placed or performed during the hospital encounter of 11/30/15 (from the past 48 hour(s))  I-Stat Chem 8, ED     Status: Abnormal   Collection Time: 11/30/15 12:26 PM  Result Value Ref Range   Sodium 141 135 - 145 mmol/L   Potassium 4.3 3.5 - 5.1 mmol/L   Chloride 100 (L) 101 - 111 mmol/L   BUN 32 (H) 6 - 20 mg/dL   Creatinine, Ser 0.90 0.61 - 1.24 mg/dL   Glucose, Bld 124 (H) 65 - 99 mg/dL   Calcium, Ion 1.19 1.15 - 1.40 mmol/L   TCO2 32 0 - 100 mmol/L   Hemoglobin 18.4 (H) 13.0 - 17.0 g/dL   HCT 54.0 (H) 39.0 - 52.0 %  CBC with Differential/Platelet     Status: None   Collection Time: 11/30/15  2:45 PM  Result Value Ref Range   WBC 8.2 4.0 - 10.5 K/uL   RBC 5.04 4.22 - 5.81 MIL/uL   Hemoglobin 16.3 13.0 - 17.0 g/dL   HCT 47.6 39.0 - 52.0 %   MCV 94.4 78.0 - 100.0 fL   MCH 32.3 26.0 - 34.0 pg   MCHC 34.2 30.0 - 36.0 g/dL   RDW 13.5 11.5 - 15.5 %   Platelets 169 150 - 400 K/uL   Neutrophils Relative % 80 %   Neutro Abs 6.5 1.7 - 7.7 K/uL   Lymphocytes Relative 11 %   Lymphs Abs 0.9 0.7 - 4.0 K/uL   Monocytes Relative 9 %   Monocytes Absolute 0.7 0.1 - 1.0 K/uL   Eosinophils Relative 0 %   Eosinophils Absolute 0.0 0.0 - 0.7 K/uL   Basophils Relative 0 %   Basophils Absolute 0.0 0.0 - 0.1 K/uL  Comprehensive metabolic panel     Status: Abnormal   Collection Time: 11/30/15  3:28 PM  Result Value Ref Range   Sodium 138 135 - 145 mmol/L   Potassium 4.0 3.5 - 5.1 mmol/L   Chloride 107 101 - 111 mmol/L   CO2 26 22 - 32 mmol/L   Glucose, Bld 107 (H) 65 - 99 mg/dL   BUN 22 (H) 6 - 20 mg/dL   Creatinine, Ser 0.90 0.61 - 1.24 mg/dL   Calcium 8.3 (L) 8.9 - 10.3 mg/dL   Total Protein 6.3 (L) 6.5 - 8.1 g/dL   Albumin 3.6 3.5 - 5.0 g/dL   AST 28 15 - 41 U/L   ALT 40 17 - 63 U/L   Alkaline Phosphatase 45 38 - 126 U/L   Total Bilirubin 1.0 0.3 - 1.2 mg/dL   GFR calc non Af Amer >60 >60 mL/min   GFR  calc Af Amer >60 >60  mL/min    Comment: (NOTE) The eGFR has been calculated using the CKD EPI equation. This calculation has not been validated in all clinical situations. eGFR's persistently <60 mL/min signify possible Chronic Kidney Disease.    Anion gap 5 5 - 15  MRSA PCR Screening     Status: None   Collection Time: 12/01/15  4:00 AM  Result Value Ref Range   MRSA by PCR NEGATIVE NEGATIVE    Comment:        The GeneXpert MRSA Assay (FDA approved for NASAL specimens only), is one component of a comprehensive MRSA colonization surveillance program. It is not intended to diagnose MRSA infection nor to guide or monitor treatment for MRSA infections.   CBC     Status: Abnormal   Collection Time: 12/01/15  7:04 AM  Result Value Ref Range   WBC 7.2 4.0 - 10.5 K/uL   RBC 4.81 4.22 - 5.81 MIL/uL   Hemoglobin 15.5 13.0 - 17.0 g/dL   HCT 46.9 39.0 - 52.0 %   MCV 97.5 78.0 - 100.0 fL   MCH 32.2 26.0 - 34.0 pg   MCHC 33.0 30.0 - 36.0 g/dL   RDW 13.1 11.5 - 15.5 %   Platelets 140 (L) 150 - 400 K/uL  Basic metabolic panel     Status: Abnormal   Collection Time: 12/01/15  7:04 AM  Result Value Ref Range   Sodium 140 135 - 145 mmol/L   Potassium 4.7 3.5 - 5.1 mmol/L   Chloride 107 101 - 111 mmol/L   CO2 27 22 - 32 mmol/L   Glucose, Bld 105 (H) 65 - 99 mg/dL   BUN 18 6 - 20 mg/dL   Creatinine, Ser 0.85 0.61 - 1.24 mg/dL   Calcium 8.7 (L) 8.9 - 10.3 mg/dL   GFR calc non Af Amer >60 >60 mL/min   GFR calc Af Amer >60 >60 mL/min    Comment: (NOTE) The eGFR has been calculated using the CKD EPI equation. This calculation has not been validated in all clinical situations. eGFR's persistently <60 mL/min signify possible Chronic Kidney Disease.    Anion gap 6 5 - 15    Dg Chest 2 View  Result Date: 12/01/2015 CLINICAL DATA:  History of multiple rib fractures and pneumothorax. EXAM: CHEST  2 VIEW COMPARISON:  CT scan of the chest of November 30, 2015 FINDINGS: A known tiny right apical pneumothorax is  not clearly evident today. The known right lower rib fractures are partially imaged. The lung volumes are low. There is minimal atelectasis at the lung bases. There is no significant pleural effusion. The cardiac silhouette is enlarged. The pulmonary vascularity is normal. There is calcification in the wall of the aortic arch. IMPRESSION: The known tiny right apical pneumothorax is not clearly evident on today's study. There is no significant pleural effusion. There is minimal bibasilar atelectasis. Known lower right rib fractures. Aortic atherosclerosis.  New Electronically Signed   By: David  Martinique M.D.   On: 12/01/2015 07:45   Ct Chest W Contrast  Result Date: 11/30/2015 CLINICAL DATA:  Fall from ladder EXAM: CT CHEST, ABDOMEN, AND PELVIS WITH CONTRAST TECHNIQUE: Multidetector CT imaging of the chest, abdomen and pelvis was performed following the standard protocol during bolus administration of intravenous contrast. CONTRAST:  150m ISOVUE-300 IOPAMIDOL (ISOVUE-300) INJECTION 61% COMPARISON:  None. FINDINGS: CT CHEST FINDINGS Cardiovascular: There is no mediastinal hematoma. There is mild atherosclerotic calcification in the aorta. A focal area  of decreased attenuation is noted in the aortic arch just distal to the left subclavian artery measuring 6 x 6 mm, possibly a small intramural hematoma. It is best seen on coronal slice 37 series 4 and sagittal slice 092 series 5. No other mucosal lesions are evident in the aorta on this study. Pericardium is not thickened. No major vessel pulmonary embolus is evident. Mediastinum/Nodes: Thyroid appears unremarkable. There is no evident thoracic adenopathy. There are subcentimeter lymph nodes which do not meet size criteria for pathologic significance. There is a focal hiatal hernia. Lungs/Pleura: There is a right pleural effusion with atelectasis/ consolidation in the right lower lobe, primarily in the superior and posterior segments. There is mild atelectasis in  the left base. There is a minimal pneumothorax on the right in the medial apex region. Somewhat greater soft tissue air is noted on the right. The lungs elsewhere are clear. Musculoskeletal: There are fractures of the lateral right ninth, tenth, and eleventh ribs and posterior right twelfth rib, not significantly displaced. No other fractures are evident. Soft tissue air is noted on the right. There is degenerative change in the lower thoracic spine. CT ABDOMEN PELVIS FINDINGS Hepatobiliary: Multiple cysts are noted throughout the liver. Cysts range in size from as small as 3 mm to as large as 4.0 x 3.6 cm. This largest cyst is in the anterior segment right lobe near the dome. There is no perihepatic fluid. No liver laceration or rupture is evident. There is mild fatty infiltration near the fissure for the ligamentum teres. Gallbladder wall is not appreciably thickened. There is no biliary duct dilatation. Pancreas: No pancreatic mass or inflammatory focus is evident. There is no peripancreatic fluid. Spleen: Spleen appears intact without laceration or rupture. No focal splenic lesions are identified. There is no perisplenic fluid. Adrenals/Urinary Tract: Right adrenal is normal. There is a 1.1 x 1.0 cm left adrenal adenoma. There is a cyst arising from the lateral left kidney measuring 6.9 x 6.0 cm. No other focal renal lesions are identified. There is no hydronephrosis on either side. No renal laceration or rupture is evident. There is no perinephric fluid. No renal or ureteral calculus is identified. Urinary bladder is midline with wall thickness within normal limits. Stomach/Bowel: There are multiple colonic diverticulum, most notably in the distal transverse colon, descending colon, and splenic flexure regions. No evidence of diverticulitis. There is no bowel wall or mesenteric thickening. No bowel obstruction. No free air or portal venous air is evident. Vascular/Lymphatic: There is mild atherosclerotic  calcification in the aorta. There is no abdominal aortic aneurysm. Major mesenteric vessels appear intact. There is no periaortic fluid. No adenopathy is evident in the abdomen or pelvis. Reproductive: Prostate and seminal vesicles appear unremarkable. No pelvic mass or pelvic fluid collection is evident. Other: There is fat in each inguinal ring. Appendix appears normal. No ascites or abscess is evident in the abdomen or pelvis. There is no abnormal peritoneal or retroperitoneal stranding or fluid collection. Musculoskeletal: There is degenerative change in the lumbar spine. No fractures are noted in the bones of the abdomen and pelvis. There is no intramuscular or abdominal wall region. IMPRESSION: CT chest: Fractures of the right ninth, tenth, eleventh, and twelfth ribs with small right apical pneumothorax and moderate soft tissue air laterally on the right. There is a right pleural effusion with consolidation/ atelectasis in the right base. There is mild posterior left base atelectasis. There is a small focus of decreased attenuation in the aortic arch, possibly a  small intramural hematoma. No dissection or transsection seen. No mediastinal hematoma evident. No aneurysm. There is a focal hiatal hernia.  No adenopathy. CT abdomen and pelvis: Multiple liver cysts. Sizable cyst arising from left kidney. No traumatic appearing lesion identified in the abdomen or pelvis. Multiple colonic diverticula without diverticulitis. No inflammatory focus. No bowel obstruction. Appendix appears normal. No abscess. No fractures evident in the bones of the abdomen and pelvis. Small left adrenal adenoma. Critical Value/emergent results were called by telephone at the time of interpretation on 11/30/2015 at 1:42 pm to Dr. Merrily Pew , who verbally acknowledged these results. Electronically Signed   By: Lowella Grip III M.D.   On: 11/30/2015 13:42   Ct Abdomen Pelvis W Contrast  Result Date: 11/30/2015 CLINICAL DATA:   Fall from ladder EXAM: CT CHEST, ABDOMEN, AND PELVIS WITH CONTRAST TECHNIQUE: Multidetector CT imaging of the chest, abdomen and pelvis was performed following the standard protocol during bolus administration of intravenous contrast. CONTRAST:  128m ISOVUE-300 IOPAMIDOL (ISOVUE-300) INJECTION 61% COMPARISON:  None. FINDINGS: CT CHEST FINDINGS Cardiovascular: There is no mediastinal hematoma. There is mild atherosclerotic calcification in the aorta. A focal area of decreased attenuation is noted in the aortic arch just distal to the left subclavian artery measuring 6 x 6 mm, possibly a small intramural hematoma. It is best seen on coronal slice 37 series 4 and sagittal slice 1032series 5. No other mucosal lesions are evident in the aorta on this study. Pericardium is not thickened. No major vessel pulmonary embolus is evident. Mediastinum/Nodes: Thyroid appears unremarkable. There is no evident thoracic adenopathy. There are subcentimeter lymph nodes which do not meet size criteria for pathologic significance. There is a focal hiatal hernia. Lungs/Pleura: There is a right pleural effusion with atelectasis/ consolidation in the right lower lobe, primarily in the superior and posterior segments. There is mild atelectasis in the left base. There is a minimal pneumothorax on the right in the medial apex region. Somewhat greater soft tissue air is noted on the right. The lungs elsewhere are clear. Musculoskeletal: There are fractures of the lateral right ninth, tenth, and eleventh ribs and posterior right twelfth rib, not significantly displaced. No other fractures are evident. Soft tissue air is noted on the right. There is degenerative change in the lower thoracic spine. CT ABDOMEN PELVIS FINDINGS Hepatobiliary: Multiple cysts are noted throughout the liver. Cysts range in size from as small as 3 mm to as large as 4.0 x 3.6 cm. This largest cyst is in the anterior segment right lobe near the dome. There is no  perihepatic fluid. No liver laceration or rupture is evident. There is mild fatty infiltration near the fissure for the ligamentum teres. Gallbladder wall is not appreciably thickened. There is no biliary duct dilatation. Pancreas: No pancreatic mass or inflammatory focus is evident. There is no peripancreatic fluid. Spleen: Spleen appears intact without laceration or rupture. No focal splenic lesions are identified. There is no perisplenic fluid. Adrenals/Urinary Tract: Right adrenal is normal. There is a 1.1 x 1.0 cm left adrenal adenoma. There is a cyst arising from the lateral left kidney measuring 6.9 x 6.0 cm. No other focal renal lesions are identified. There is no hydronephrosis on either side. No renal laceration or rupture is evident. There is no perinephric fluid. No renal or ureteral calculus is identified. Urinary bladder is midline with wall thickness within normal limits. Stomach/Bowel: There are multiple colonic diverticulum, most notably in the distal transverse colon, descending colon, and splenic flexure  regions. No evidence of diverticulitis. There is no bowel wall or mesenteric thickening. No bowel obstruction. No free air or portal venous air is evident. Vascular/Lymphatic: There is mild atherosclerotic calcification in the aorta. There is no abdominal aortic aneurysm. Major mesenteric vessels appear intact. There is no periaortic fluid. No adenopathy is evident in the abdomen or pelvis. Reproductive: Prostate and seminal vesicles appear unremarkable. No pelvic mass or pelvic fluid collection is evident. Other: There is fat in each inguinal ring. Appendix appears normal. No ascites or abscess is evident in the abdomen or pelvis. There is no abnormal peritoneal or retroperitoneal stranding or fluid collection. Musculoskeletal: There is degenerative change in the lumbar spine. No fractures are noted in the bones of the abdomen and pelvis. There is no intramuscular or abdominal wall region.  IMPRESSION: CT chest: Fractures of the right ninth, tenth, eleventh, and twelfth ribs with small right apical pneumothorax and moderate soft tissue air laterally on the right. There is a right pleural effusion with consolidation/ atelectasis in the right base. There is mild posterior left base atelectasis. There is a small focus of decreased attenuation in the aortic arch, possibly a small intramural hematoma. No dissection or transsection seen. No mediastinal hematoma evident. No aneurysm. There is a focal hiatal hernia.  No adenopathy. CT abdomen and pelvis: Multiple liver cysts. Sizable cyst arising from left kidney. No traumatic appearing lesion identified in the abdomen or pelvis. Multiple colonic diverticula without diverticulitis. No inflammatory focus. No bowel obstruction. Appendix appears normal. No abscess. No fractures evident in the bones of the abdomen and pelvis. Small left adrenal adenoma. Critical Value/emergent results were called by telephone at the time of interpretation on 11/30/2015 at 1:42 pm to Dr. Merrily Pew , who verbally acknowledged these results. Electronically Signed   By: Lowella Grip III M.D.   On: 11/30/2015 13:42    Review of Systems  Constitutional: Negative for chills, fever and malaise/fatigue.  HENT: Negative.   Eyes: Negative.   Respiratory: Negative for cough, hemoptysis, sputum production and shortness of breath.        Right lower lateral to posterior chest wall pain.  Cardiovascular: Negative.   Gastrointestinal: Negative.   Genitourinary: Negative.   Musculoskeletal: Negative.   Skin: Negative.   Neurological: Negative.   Endo/Heme/Allergies: Negative.   Psychiatric/Behavioral: Negative.    Blood pressure 136/63, pulse (!) 106, temperature 97.5 F (36.4 C), temperature source Oral, resp. rate 17, height 5' 10"  (1.778 m), weight 87 kg (191 lb 12.8 oz), SpO2 (!) 77 %. Physical Exam  Constitutional: He is oriented to person, place, and time. He  appears well-developed and well-nourished. No distress.  HENT:  Head: Normocephalic and atraumatic.  Multiple old missing teeth.  Eyes: EOM are normal. Pupils are equal, round, and reactive to light.  Neck: Normal range of motion. Neck supple. No JVD present. No thyromegaly present.  Cardiovascular: Normal rate, regular rhythm and normal heart sounds.   No murmur heard. Respiratory: Effort normal. No respiratory distress. He exhibits tenderness.  Crackles left base  GI: Soft. Bowel sounds are normal. He exhibits no distension and no mass. There is no tenderness.  Musculoskeletal: Normal range of motion. He exhibits no edema.  Lymphadenopathy:    He has no cervical adenopathy.  Neurological: He is alert and oriented to person, place, and time.  Skin: Skin is warm and dry.  Psychiatric: He has a normal mood and affect.   CLINICAL DATA:  Fall from ladder  EXAM: CT CHEST, ABDOMEN,  AND PELVIS WITH CONTRAST  TECHNIQUE: Multidetector CT imaging of the chest, abdomen and pelvis was performed following the standard protocol during bolus administration of intravenous contrast.  CONTRAST:  162m ISOVUE-300 IOPAMIDOL (ISOVUE-300) INJECTION 61%  COMPARISON:  None.  FINDINGS: CT CHEST FINDINGS  Cardiovascular: There is no mediastinal hematoma. There is mild atherosclerotic calcification in the aorta. A focal area of decreased attenuation is noted in the aortic arch just distal to the left subclavian artery measuring 6 x 6 mm, possibly a small intramural hematoma. It is best seen on coronal slice 37 series 4 and sagittal slice 1299series 5. No other mucosal lesions are evident in the aorta on this study. Pericardium is not thickened. No major vessel pulmonary embolus is evident.  Mediastinum/Nodes: Thyroid appears unremarkable. There is no evident thoracic adenopathy. There are subcentimeter lymph nodes which do not meet size criteria for pathologic significance. There is a  focal hiatal hernia.  Lungs/Pleura: There is a right pleural effusion with atelectasis/ consolidation in the right lower lobe, primarily in the superior and posterior segments. There is mild atelectasis in the left base. There is a minimal pneumothorax on the right in the medial apex region. Somewhat greater soft tissue air is noted on the right. The lungs elsewhere are clear.  Musculoskeletal: There are fractures of the lateral right ninth, tenth, and eleventh ribs and posterior right twelfth rib, not significantly displaced. No other fractures are evident. Soft tissue air is noted on the right. There is degenerative change in the lower thoracic spine.  CT ABDOMEN PELVIS FINDINGS  Hepatobiliary: Multiple cysts are noted throughout the liver. Cysts range in size from as small as 3 mm to as large as 4.0 x 3.6 cm. This largest cyst is in the anterior segment right lobe near the dome. There is no perihepatic fluid. No liver laceration or rupture is evident. There is mild fatty infiltration near the fissure for the ligamentum teres. Gallbladder wall is not appreciably thickened. There is no biliary duct dilatation.  Pancreas: No pancreatic mass or inflammatory focus is evident. There is no peripancreatic fluid.  Spleen: Spleen appears intact without laceration or rupture. No focal splenic lesions are identified. There is no perisplenic fluid.  Adrenals/Urinary Tract: Right adrenal is normal. There is a 1.1 x 1.0 cm left adrenal adenoma. There is a cyst arising from the lateral left kidney measuring 6.9 x 6.0 cm. No other focal renal lesions are identified. There is no hydronephrosis on either side. No renal laceration or rupture is evident. There is no perinephric fluid. No renal or ureteral calculus is identified. Urinary bladder is midline with wall thickness within normal limits.  Stomach/Bowel: There are multiple colonic diverticulum, most notably in the distal  transverse colon, descending colon, and splenic flexure regions. No evidence of diverticulitis. There is no bowel wall or mesenteric thickening. No bowel obstruction. No free air or portal venous air is evident.  Vascular/Lymphatic: There is mild atherosclerotic calcification in the aorta. There is no abdominal aortic aneurysm. Major mesenteric vessels appear intact. There is no periaortic fluid. No adenopathy is evident in the abdomen or pelvis.  Reproductive: Prostate and seminal vesicles appear unremarkable. No pelvic mass or pelvic fluid collection is evident.  Other: There is fat in each inguinal ring. Appendix appears normal. No ascites or abscess is evident in the abdomen or pelvis. There is no abnormal peritoneal or retroperitoneal stranding or fluid collection.  Musculoskeletal: There is degenerative change in the lumbar spine. No fractures are noted in  the bones of the abdomen and pelvis. There is no intramuscular or abdominal wall region.  IMPRESSION: CT chest: Fractures of the right ninth, tenth, eleventh, and twelfth ribs with small right apical pneumothorax and moderate soft tissue air laterally on the right. There is a right pleural effusion with consolidation/ atelectasis in the right base. There is mild posterior left base atelectasis.  There is a small focus of decreased attenuation in the aortic arch, possibly a small intramural hematoma. No dissection or transsection seen. No mediastinal hematoma evident. No aneurysm.  There is a focal hiatal hernia.  No adenopathy.  CT abdomen and pelvis: Multiple liver cysts. Sizable cyst arising from left kidney. No traumatic appearing lesion identified in the abdomen or pelvis. Multiple colonic diverticula without diverticulitis. No inflammatory focus. No bowel obstruction. Appendix appears normal. No abscess. No fractures evident in the bones of the abdomen and pelvis. Small left adrenal adenoma.  Critical  Value/emergent results were called by telephone at the time of interpretation on 11/30/2015 at 1:42 pm to Dr. Merrily Pew , who verbally acknowledged these results.   Electronically Signed   By: Lowella Grip III M.D.   On: 11/30/2015 13:42  Assessment/Plan:  I have personally reviewed and interpreted his Chest CTA. He had a fall from 15 ft with multiple right sided rib fractures. He has a small filling defect in the wall of the aortic arch just opposite the origin of the left subclavian artery. Etiology of this is unclear but it is small. It is not a dissection or transection and there is no hematoma around the aorta. It could be an intramural hematoma but I would expect some thickening of the aortic wall there. It could be non-calcified plaque. It could be artifact. I don't think there is anything to do about it except keep his BP under good control and do a follow up scan in about a week. I suspect that his markedly elevated BP now is due to pain from the rib fractures.   Gaye Pollack 12/01/2015, 12:51 PM

## 2015-12-01 NOTE — Care Management Note (Signed)
Case Management Note  Patient Details  Name: Travis Pruitt MRN: [id number] Date of Birth: 1935/08/04  Subjective/Objective:  Pt admitted on 11/30/15 after falling 15 feet off a ladder on Saturday.  He sustained Rt rib fractures 9-12, small Rt PTX, and small intramural hematoma.   PTA, pt independent, lives with spouse.              Action/Plan: Will follow for discharge planning as pt progresses.  TCTS to consult regarding possible aortic arch intramural hematoma.   Expected Discharge Date:                  Expected Discharge Plan:  Home/Self Care  In-House Referral:     Discharge planning Services  CM Consult  Post Acute Care Choice:    Choice offered to:     DME Arranged:    DME Agency:     HH Arranged:    HH Agency:     Status of Service:  In process, will continue to follow  If discussed at Long Length of Stay Meetings, dates discussed:    Additional Comments:  Reinaldo Raddle, RN, BSN  Trauma/Neuro ICU Case Manager [phone]

## 2015-12-01 NOTE — Progress Notes (Signed)
Subjective: R rib pain, no SOB  Objective: Vital signs in last 24 hours: Temp:  [97.5 F (36.4 C)-98.5 F (36.9 C)] 98.5 F (36.9 C) (10/30 2359) Pulse Rate:  [50-71] 55 (10/31 0715) Resp:  [11-24] 16 (10/31 0715) BP: (133-214)/(73-98) 133/76 (10/31 0700) SpO2:  [88 %-100 %] 95 % (10/31 0715) Weight:  [87 kg (191 lb 12.8 oz)] 87 kg (191 lb 12.8 oz) (10/30 2200)    Intake/Output from previous day: 10/30 0701 - 10/31 0700 In: 2272.6 [I.V.:1272.6; IV Piggyback:1000] Out: -  Intake/Output this shift: No intake/output data recorded.  General appearance: cooperative Resp: clear to auscultation bilaterally Chest wall: right sided chest wall tenderness Cardio: regular rate and rhythm and 50s GI: soft, RUQ tender along lower ribs but otherwise NT  Lab Results: CBC   Recent Labs  11/30/15 1445 12/01/15 0704  WBC 8.2 7.2  HGB 16.3 15.5  HCT 47.6 46.9  PLT 169 140*   BMET  Recent Labs  11/30/15 1528 12/01/15 0704  NA 138 140  K 4.0 4.7  CL 107 107  CO2 26 27  GLUCOSE 107* 105*  BUN 22* 18  CREATININE 0.90 0.85  CALCIUM 8.3* 8.7*   PT/INR No results for input(s): LABPROT, INR in the last 72 hours. ABG No results for input(s): PHART, HCO3 in the last 72 hours.  Invalid input(s): PCO2, PO2  Studies/Results: Dg Chest 2 View  Result Date: 12/01/2015 CLINICAL DATA:  History of multiple rib fractures and pneumothorax. EXAM: CHEST  2 VIEW COMPARISON:  CT scan of the chest of November 30, 2015 FINDINGS: A known tiny right apical pneumothorax is not clearly evident today. The known right lower rib fractures are partially imaged. The lung volumes are low. There is minimal atelectasis at the lung bases. There is no significant pleural effusion. The cardiac silhouette is enlarged. The pulmonary vascularity is normal. There is calcification in the wall of the aortic arch. IMPRESSION: The known tiny right apical pneumothorax is not clearly evident on today's study. There is  no significant pleural effusion. There is minimal bibasilar atelectasis. Known lower right rib fractures. Aortic atherosclerosis.  New Electronically Signed   By: David  Martinique M.D.   On: 12/01/2015 07:45   Ct Chest W Contrast  Result Date: 11/30/2015 CLINICAL DATA:  Fall from ladder EXAM: CT CHEST, ABDOMEN, AND PELVIS WITH CONTRAST TECHNIQUE: Multidetector CT imaging of the chest, abdomen and pelvis was performed following the standard protocol during bolus administration of intravenous contrast. CONTRAST:  141mL ISOVUE-300 IOPAMIDOL (ISOVUE-300) INJECTION 61% COMPARISON:  None. FINDINGS: CT CHEST FINDINGS Cardiovascular: There is no mediastinal hematoma. There is mild atherosclerotic calcification in the aorta. A focal area of decreased attenuation is noted in the aortic arch just distal to the left subclavian artery measuring 6 x 6 mm, possibly a small intramural hematoma. It is best seen on coronal slice 37 series 4 and sagittal slice 123456 series 5. No other mucosal lesions are evident in the aorta on this study. Pericardium is not thickened. No major vessel pulmonary embolus is evident. Mediastinum/Nodes: Thyroid appears unremarkable. There is no evident thoracic adenopathy. There are subcentimeter lymph nodes which do not meet size criteria for pathologic significance. There is a focal hiatal hernia. Lungs/Pleura: There is a right pleural effusion with atelectasis/ consolidation in the right lower lobe, primarily in the superior and posterior segments. There is mild atelectasis in the left base. There is a minimal pneumothorax on the right in the medial apex region. Somewhat greater soft tissue  air is noted on the right. The lungs elsewhere are clear. Musculoskeletal: There are fractures of the lateral right ninth, tenth, and eleventh ribs and posterior right twelfth rib, not significantly displaced. No other fractures are evident. Soft tissue air is noted on the right. There is degenerative change in the  lower thoracic spine. CT ABDOMEN PELVIS FINDINGS Hepatobiliary: Multiple cysts are noted throughout the liver. Cysts range in size from as small as 3 mm to as large as 4.0 x 3.6 cm. This largest cyst is in the anterior segment right lobe near the dome. There is no perihepatic fluid. No liver laceration or rupture is evident. There is mild fatty infiltration near the fissure for the ligamentum teres. Gallbladder wall is not appreciably thickened. There is no biliary duct dilatation. Pancreas: No pancreatic mass or inflammatory focus is evident. There is no peripancreatic fluid. Spleen: Spleen appears intact without laceration or rupture. No focal splenic lesions are identified. There is no perisplenic fluid. Adrenals/Urinary Tract: Right adrenal is normal. There is a 1.1 x 1.0 cm left adrenal adenoma. There is a cyst arising from the lateral left kidney measuring 6.9 x 6.0 cm. No other focal renal lesions are identified. There is no hydronephrosis on either side. No renal laceration or rupture is evident. There is no perinephric fluid. No renal or ureteral calculus is identified. Urinary bladder is midline with wall thickness within normal limits. Stomach/Bowel: There are multiple colonic diverticulum, most notably in the distal transverse colon, descending colon, and splenic flexure regions. No evidence of diverticulitis. There is no bowel wall or mesenteric thickening. No bowel obstruction. No free air or portal venous air is evident. Vascular/Lymphatic: There is mild atherosclerotic calcification in the aorta. There is no abdominal aortic aneurysm. Major mesenteric vessels appear intact. There is no periaortic fluid. No adenopathy is evident in the abdomen or pelvis. Reproductive: Prostate and seminal vesicles appear unremarkable. No pelvic mass or pelvic fluid collection is evident. Other: There is fat in each inguinal ring. Appendix appears normal. No ascites or abscess is evident in the abdomen or pelvis. There  is no abnormal peritoneal or retroperitoneal stranding or fluid collection. Musculoskeletal: There is degenerative change in the lumbar spine. No fractures are noted in the bones of the abdomen and pelvis. There is no intramuscular or abdominal wall region. IMPRESSION: CT chest: Fractures of the right ninth, tenth, eleventh, and twelfth ribs with small right apical pneumothorax and moderate soft tissue air laterally on the right. There is a right pleural effusion with consolidation/ atelectasis in the right base. There is mild posterior left base atelectasis. There is a small focus of decreased attenuation in the aortic arch, possibly a small intramural hematoma. No dissection or transsection seen. No mediastinal hematoma evident. No aneurysm. There is a focal hiatal hernia.  No adenopathy. CT abdomen and pelvis: Multiple liver cysts. Sizable cyst arising from left kidney. No traumatic appearing lesion identified in the abdomen or pelvis. Multiple colonic diverticula without diverticulitis. No inflammatory focus. No bowel obstruction. Appendix appears normal. No abscess. No fractures evident in the bones of the abdomen and pelvis. Small left adrenal adenoma. Critical Value/emergent results were called by telephone at the time of interpretation on 11/30/2015 at 1:42 pm to Dr. Merrily Pew , who verbally acknowledged these results. Electronically Signed   By: Lowella Grip III M.D.   On: 11/30/2015 13:42   Ct Abdomen Pelvis W Contrast  Result Date: 11/30/2015 CLINICAL DATA:  Fall from ladder EXAM: CT CHEST, ABDOMEN, AND PELVIS  WITH CONTRAST TECHNIQUE: Multidetector CT imaging of the chest, abdomen and pelvis was performed following the standard protocol during bolus administration of intravenous contrast. CONTRAST:  141mL ISOVUE-300 IOPAMIDOL (ISOVUE-300) INJECTION 61% COMPARISON:  None. FINDINGS: CT CHEST FINDINGS Cardiovascular: There is no mediastinal hematoma. There is mild atherosclerotic calcification  in the aorta. A focal area of decreased attenuation is noted in the aortic arch just distal to the left subclavian artery measuring 6 x 6 mm, possibly a small intramural hematoma. It is best seen on coronal slice 37 series 4 and sagittal slice 123456 series 5. No other mucosal lesions are evident in the aorta on this study. Pericardium is not thickened. No major vessel pulmonary embolus is evident. Mediastinum/Nodes: Thyroid appears unremarkable. There is no evident thoracic adenopathy. There are subcentimeter lymph nodes which do not meet size criteria for pathologic significance. There is a focal hiatal hernia. Lungs/Pleura: There is a right pleural effusion with atelectasis/ consolidation in the right lower lobe, primarily in the superior and posterior segments. There is mild atelectasis in the left base. There is a minimal pneumothorax on the right in the medial apex region. Somewhat greater soft tissue air is noted on the right. The lungs elsewhere are clear. Musculoskeletal: There are fractures of the lateral right ninth, tenth, and eleventh ribs and posterior right twelfth rib, not significantly displaced. No other fractures are evident. Soft tissue air is noted on the right. There is degenerative change in the lower thoracic spine. CT ABDOMEN PELVIS FINDINGS Hepatobiliary: Multiple cysts are noted throughout the liver. Cysts range in size from as small as 3 mm to as large as 4.0 x 3.6 cm. This largest cyst is in the anterior segment right lobe near the dome. There is no perihepatic fluid. No liver laceration or rupture is evident. There is mild fatty infiltration near the fissure for the ligamentum teres. Gallbladder wall is not appreciably thickened. There is no biliary duct dilatation. Pancreas: No pancreatic mass or inflammatory focus is evident. There is no peripancreatic fluid. Spleen: Spleen appears intact without laceration or rupture. No focal splenic lesions are identified. There is no perisplenic  fluid. Adrenals/Urinary Tract: Right adrenal is normal. There is a 1.1 x 1.0 cm left adrenal adenoma. There is a cyst arising from the lateral left kidney measuring 6.9 x 6.0 cm. No other focal renal lesions are identified. There is no hydronephrosis on either side. No renal laceration or rupture is evident. There is no perinephric fluid. No renal or ureteral calculus is identified. Urinary bladder is midline with wall thickness within normal limits. Stomach/Bowel: There are multiple colonic diverticulum, most notably in the distal transverse colon, descending colon, and splenic flexure regions. No evidence of diverticulitis. There is no bowel wall or mesenteric thickening. No bowel obstruction. No free air or portal venous air is evident. Vascular/Lymphatic: There is mild atherosclerotic calcification in the aorta. There is no abdominal aortic aneurysm. Major mesenteric vessels appear intact. There is no periaortic fluid. No adenopathy is evident in the abdomen or pelvis. Reproductive: Prostate and seminal vesicles appear unremarkable. No pelvic mass or pelvic fluid collection is evident. Other: There is fat in each inguinal ring. Appendix appears normal. No ascites or abscess is evident in the abdomen or pelvis. There is no abnormal peritoneal or retroperitoneal stranding or fluid collection. Musculoskeletal: There is degenerative change in the lumbar spine. No fractures are noted in the bones of the abdomen and pelvis. There is no intramuscular or abdominal wall region. IMPRESSION: CT chest: Fractures of  the right ninth, tenth, eleventh, and twelfth ribs with small right apical pneumothorax and moderate soft tissue air laterally on the right. There is a right pleural effusion with consolidation/ atelectasis in the right base. There is mild posterior left base atelectasis. There is a small focus of decreased attenuation in the aortic arch, possibly a small intramural hematoma. No dissection or transsection seen.  No mediastinal hematoma evident. No aneurysm. There is a focal hiatal hernia.  No adenopathy. CT abdomen and pelvis: Multiple liver cysts. Sizable cyst arising from left kidney. No traumatic appearing lesion identified in the abdomen or pelvis. Multiple colonic diverticula without diverticulitis. No inflammatory focus. No bowel obstruction. Appendix appears normal. No abscess. No fractures evident in the bones of the abdomen and pelvis. Small left adrenal adenoma. Critical Value/emergent results were called by telephone at the time of interpretation on 11/30/2015 at 1:42 pm to Dr. Merrily Pew , who verbally acknowledged these results. Electronically Signed   By: Lowella Grip III M.D.   On: 11/30/2015 13:42    Anti-infectives: Anti-infectives    None      Assessment/Plan: Fall from ladder R rib FX 9-12 with small PTX - pain control and pulm toilet, no PTX on CXR this AM HTN - resume oral atenolol, wean off esmolol ? Aortic injury - F/U CT in 1 week per Dr. Cyndia Bent FEN - diet, KVO IVF [place] - ICU, PT/OT    LOS: 1 day    Georganna Skeans, MD, MPH, FACS Trauma: [phone] General Surgery: [phone]  10/31/2017Patient ID: Travis Pruitt, male   DOB: 1935-07-14, 80 y.o.   MRN: [id number]

## 2015-12-02 ENCOUNTER — Inpatient Hospital Stay (HOSPITAL_COMMUNITY): Payer: Medicare Other

## 2015-12-02 LAB — BASIC METABOLIC PANEL
ANION GAP: 8 (ref 5–15)
BUN: 13 mg/dL (ref 6–20)
CO2: 25 mmol/L (ref 22–32)
Calcium: 8.5 mg/dL — ABNORMAL LOW (ref 8.9–10.3)
Chloride: 105 mmol/L (ref 101–111)
Creatinine, Ser: 0.79 mg/dL (ref 0.61–1.24)
GFR calc Af Amer: >60 mL/min (ref >60)
GLUCOSE: 108 mg/dL — AB (ref 65–99)
POTASSIUM: 3.3 mmol/L — AB (ref 3.5–5.1)
Sodium: 138 mmol/L (ref 135–145)

## 2015-12-02 MED ORDER — POTASSIUM CHLORIDE 20 MEQ PO PACK
20.0000 meq | PACK | Freq: Two times a day (BID) | ORAL | Status: DC
Start: 2015-12-02 — End: 2015-12-03
  Administered 2015-12-02 – 2015-12-03 (×3): 20 meq via ORAL
  Filled 2015-12-02 (×4): qty 1

## 2015-12-02 NOTE — Progress Notes (Signed)
Trauma Service Note  Subjective: Patient sitting up in chair.  No distress.  Able to talk coherently.  Objective: Vital signs in last 24 hours: Temp:  [97.5 F (36.4 C)-99 F (37.2 C)] 98 F (36.7 C) (11/01 0709) Pulse Rate:  [47-112] 57 (11/01 0800) Resp:  [13-28] 15 (11/01 0800) BP: (132-216)/(60-103) 139/70 (11/01 0800) SpO2:  [77 %-99 %] 95 % (11/01 0800) Weight:  [88 kg (194 lb 0.1 oz)] 88 kg (194 lb 0.1 oz) (11/01 0400) Last BM Date: 12/01/15  Intake/Output from previous day: 10/31 0701 - 11/01 0700 In: 735.2 [P.O.:210; I.V.:515.2] Out: -  Intake/Output this shift: No intake/output data recorded.  General: No acute distress.  Does have some chest wall pain.  Lungs: Clear to auscultation.  Abd: Benign  Extremities: No changes  Neuro: Intact  Lab Results: CBC   Recent Labs  11/30/15 1445 12/01/15 0704  WBC 8.2 7.2  HGB 16.3 15.5  HCT 47.6 46.9  PLT 169 140*   BMET  Recent Labs  12/01/15 0704 12/02/15 0246  NA 140 138  K 4.7 3.3*  CL 107 105  CO2 27 25  GLUCOSE 105* 108*  BUN 18 13  CREATININE 0.85 0.79  CALCIUM 8.7* 8.5*   PT/INR No results for input(s): LABPROT, INR in the last 72 hours. ABG No results for input(s): PHART, HCO3 in the last 72 hours.  Invalid input(s): PCO2, PO2  Studies/Results: Dg Chest 2 View  Result Date: 12/01/2015 CLINICAL DATA:  History of multiple rib fractures and pneumothorax. EXAM: CHEST  2 VIEW COMPARISON:  CT scan of the chest of November 30, 2015 FINDINGS: A known tiny right apical pneumothorax is not clearly evident today. The known right lower rib fractures are partially imaged. The lung volumes are low. There is minimal atelectasis at the lung bases. There is no significant pleural effusion. The cardiac silhouette is enlarged. The pulmonary vascularity is normal. There is calcification in the wall of the aortic arch. IMPRESSION: The known tiny right apical pneumothorax is not clearly evident on today's  study. There is no significant pleural effusion. There is minimal bibasilar atelectasis. Known lower right rib fractures. Aortic atherosclerosis.  New Electronically Signed   By: David  Martinique M.D.   On: 12/01/2015 07:45   Ct Chest W Contrast  Result Date: 11/30/2015 CLINICAL DATA:  Fall from ladder EXAM: CT CHEST, ABDOMEN, AND PELVIS WITH CONTRAST TECHNIQUE: Multidetector CT imaging of the chest, abdomen and pelvis was performed following the standard protocol during bolus administration of intravenous contrast. CONTRAST:  165mL ISOVUE-300 IOPAMIDOL (ISOVUE-300) INJECTION 61% COMPARISON:  None. FINDINGS: CT CHEST FINDINGS Cardiovascular: There is no mediastinal hematoma. There is mild atherosclerotic calcification in the aorta. A focal area of decreased attenuation is noted in the aortic arch just distal to the left subclavian artery measuring 6 x 6 mm, possibly a small intramural hematoma. It is best seen on coronal slice 37 series 4 and sagittal slice 123456 series 5. No other mucosal lesions are evident in the aorta on this study. Pericardium is not thickened. No major vessel pulmonary embolus is evident. Mediastinum/Nodes: Thyroid appears unremarkable. There is no evident thoracic adenopathy. There are subcentimeter lymph nodes which do not meet size criteria for pathologic significance. There is a focal hiatal hernia. Lungs/Pleura: There is a right pleural effusion with atelectasis/ consolidation in the right lower lobe, primarily in the superior and posterior segments. There is mild atelectasis in the left base. There is a minimal pneumothorax on the right in the  medial apex region. Somewhat greater soft tissue air is noted on the right. The lungs elsewhere are clear. Musculoskeletal: There are fractures of the lateral right ninth, tenth, and eleventh ribs and posterior right twelfth rib, not significantly displaced. No other fractures are evident. Soft tissue air is noted on the right. There is  degenerative change in the lower thoracic spine. CT ABDOMEN PELVIS FINDINGS Hepatobiliary: Multiple cysts are noted throughout the liver. Cysts range in size from as small as 3 mm to as large as 4.0 x 3.6 cm. This largest cyst is in the anterior segment right lobe near the dome. There is no perihepatic fluid. No liver laceration or rupture is evident. There is mild fatty infiltration near the fissure for the ligamentum teres. Gallbladder wall is not appreciably thickened. There is no biliary duct dilatation. Pancreas: No pancreatic mass or inflammatory focus is evident. There is no peripancreatic fluid. Spleen: Spleen appears intact without laceration or rupture. No focal splenic lesions are identified. There is no perisplenic fluid. Adrenals/Urinary Tract: Right adrenal is normal. There is a 1.1 x 1.0 cm left adrenal adenoma. There is a cyst arising from the lateral left kidney measuring 6.9 x 6.0 cm. No other focal renal lesions are identified. There is no hydronephrosis on either side. No renal laceration or rupture is evident. There is no perinephric fluid. No renal or ureteral calculus is identified. Urinary bladder is midline with wall thickness within normal limits. Stomach/Bowel: There are multiple colonic diverticulum, most notably in the distal transverse colon, descending colon, and splenic flexure regions. No evidence of diverticulitis. There is no bowel wall or mesenteric thickening. No bowel obstruction. No free air or portal venous air is evident. Vascular/Lymphatic: There is mild atherosclerotic calcification in the aorta. There is no abdominal aortic aneurysm. Major mesenteric vessels appear intact. There is no periaortic fluid. No adenopathy is evident in the abdomen or pelvis. Reproductive: Prostate and seminal vesicles appear unremarkable. No pelvic mass or pelvic fluid collection is evident. Other: There is fat in each inguinal ring. Appendix appears normal. No ascites or abscess is evident in  the abdomen or pelvis. There is no abnormal peritoneal or retroperitoneal stranding or fluid collection. Musculoskeletal: There is degenerative change in the lumbar spine. No fractures are noted in the bones of the abdomen and pelvis. There is no intramuscular or abdominal wall region. IMPRESSION: CT chest: Fractures of the right ninth, tenth, eleventh, and twelfth ribs with small right apical pneumothorax and moderate soft tissue air laterally on the right. There is a right pleural effusion with consolidation/ atelectasis in the right base. There is mild posterior left base atelectasis. There is a small focus of decreased attenuation in the aortic arch, possibly a small intramural hematoma. No dissection or transsection seen. No mediastinal hematoma evident. No aneurysm. There is a focal hiatal hernia.  No adenopathy. CT abdomen and pelvis: Multiple liver cysts. Sizable cyst arising from left kidney. No traumatic appearing lesion identified in the abdomen or pelvis. Multiple colonic diverticula without diverticulitis. No inflammatory focus. No bowel obstruction. Appendix appears normal. No abscess. No fractures evident in the bones of the abdomen and pelvis. Small left adrenal adenoma. Critical Value/emergent results were called by telephone at the time of interpretation on 11/30/2015 at 1:42 pm to Dr. Merrily Pew , who verbally acknowledged these results. Electronically Signed   By: Lowella Grip III M.D.   On: 11/30/2015 13:42   Ct Abdomen Pelvis W Contrast  Result Date: 11/30/2015 CLINICAL DATA:  Fall from  ladder EXAM: CT CHEST, ABDOMEN, AND PELVIS WITH CONTRAST TECHNIQUE: Multidetector CT imaging of the chest, abdomen and pelvis was performed following the standard protocol during bolus administration of intravenous contrast. CONTRAST:  163mL ISOVUE-300 IOPAMIDOL (ISOVUE-300) INJECTION 61% COMPARISON:  None. FINDINGS: CT CHEST FINDINGS Cardiovascular: There is no mediastinal hematoma. There is mild  atherosclerotic calcification in the aorta. A focal area of decreased attenuation is noted in the aortic arch just distal to the left subclavian artery measuring 6 x 6 mm, possibly a small intramural hematoma. It is best seen on coronal slice 37 series 4 and sagittal slice 123456 series 5. No other mucosal lesions are evident in the aorta on this study. Pericardium is not thickened. No major vessel pulmonary embolus is evident. Mediastinum/Nodes: Thyroid appears unremarkable. There is no evident thoracic adenopathy. There are subcentimeter lymph nodes which do not meet size criteria for pathologic significance. There is a focal hiatal hernia. Lungs/Pleura: There is a right pleural effusion with atelectasis/ consolidation in the right lower lobe, primarily in the superior and posterior segments. There is mild atelectasis in the left base. There is a minimal pneumothorax on the right in the medial apex region. Somewhat greater soft tissue air is noted on the right. The lungs elsewhere are clear. Musculoskeletal: There are fractures of the lateral right ninth, tenth, and eleventh ribs and posterior right twelfth rib, not significantly displaced. No other fractures are evident. Soft tissue air is noted on the right. There is degenerative change in the lower thoracic spine. CT ABDOMEN PELVIS FINDINGS Hepatobiliary: Multiple cysts are noted throughout the liver. Cysts range in size from as small as 3 mm to as large as 4.0 x 3.6 cm. This largest cyst is in the anterior segment right lobe near the dome. There is no perihepatic fluid. No liver laceration or rupture is evident. There is mild fatty infiltration near the fissure for the ligamentum teres. Gallbladder wall is not appreciably thickened. There is no biliary duct dilatation. Pancreas: No pancreatic mass or inflammatory focus is evident. There is no peripancreatic fluid. Spleen: Spleen appears intact without laceration or rupture. No focal splenic lesions are  identified. There is no perisplenic fluid. Adrenals/Urinary Tract: Right adrenal is normal. There is a 1.1 x 1.0 cm left adrenal adenoma. There is a cyst arising from the lateral left kidney measuring 6.9 x 6.0 cm. No other focal renal lesions are identified. There is no hydronephrosis on either side. No renal laceration or rupture is evident. There is no perinephric fluid. No renal or ureteral calculus is identified. Urinary bladder is midline with wall thickness within normal limits. Stomach/Bowel: There are multiple colonic diverticulum, most notably in the distal transverse colon, descending colon, and splenic flexure regions. No evidence of diverticulitis. There is no bowel wall or mesenteric thickening. No bowel obstruction. No free air or portal venous air is evident. Vascular/Lymphatic: There is mild atherosclerotic calcification in the aorta. There is no abdominal aortic aneurysm. Major mesenteric vessels appear intact. There is no periaortic fluid. No adenopathy is evident in the abdomen or pelvis. Reproductive: Prostate and seminal vesicles appear unremarkable. No pelvic mass or pelvic fluid collection is evident. Other: There is fat in each inguinal ring. Appendix appears normal. No ascites or abscess is evident in the abdomen or pelvis. There is no abnormal peritoneal or retroperitoneal stranding or fluid collection. Musculoskeletal: There is degenerative change in the lumbar spine. No fractures are noted in the bones of the abdomen and pelvis. There is no intramuscular or abdominal  wall region. IMPRESSION: CT chest: Fractures of the right ninth, tenth, eleventh, and twelfth ribs with small right apical pneumothorax and moderate soft tissue air laterally on the right. There is a right pleural effusion with consolidation/ atelectasis in the right base. There is mild posterior left base atelectasis. There is a small focus of decreased attenuation in the aortic arch, possibly a small intramural hematoma.  No dissection or transsection seen. No mediastinal hematoma evident. No aneurysm. There is a focal hiatal hernia.  No adenopathy. CT abdomen and pelvis: Multiple liver cysts. Sizable cyst arising from left kidney. No traumatic appearing lesion identified in the abdomen or pelvis. Multiple colonic diverticula without diverticulitis. No inflammatory focus. No bowel obstruction. Appendix appears normal. No abscess. No fractures evident in the bones of the abdomen and pelvis. Small left adrenal adenoma. Critical Value/emergent results were called by telephone at the time of interpretation on 11/30/2015 at 1:42 pm to Dr. Merrily Pew , who verbally acknowledged these results. Electronically Signed   By: Lowella Grip III M.D.   On: 11/30/2015 13:42   Dg Chest Port 1 View  Result Date: 12/02/2015 CLINICAL DATA:  Right rib fracture. EXAM: PORTABLE CHEST 1 VIEW COMPARISON:  Radiographs of December 01, 2015. FINDINGS: The heart size and mediastinal contours are within normal limits. Both lungs are clear. No pneumothorax or pleural effusion is noted. Right lower rib fracture seen on prior exam is not well visualized currently. IMPRESSION: No acute cardiopulmonary abnormality seen. Right rib fracture seen on prior exam is not well visualized currently. Electronically Signed   By: Marijo Conception, M.D.   On: 12/02/2015 07:59    Anti-infectives: Anti-infectives    None      Assessment/Plan: s/p  Start therapy today  Repeat CT in about a week to see if there is resolution of this possible intramural hematoma.  LOS: 2 days   Kathryne Eriksson. Dahlia Bailiff, MD, FACS [phone] Trauma Surgeon 12/02/2015

## 2015-12-02 NOTE — Progress Notes (Signed)
PT/OT recommending no outpatient follow up.  Pt states his wife can provide care at discharge.  He denies any needs for home at this time.    Reinaldo Raddle, RN, BSN  Trauma/Neuro ICU Case Manager [phone]

## 2015-12-02 NOTE — Evaluation (Signed)
Occupational Therapy Evaluation Patient Details Name: SOANE WILK MRN: [id number] DOB: 1935-08-30 Today's Date: 12/02/2015    History of Present Illness Pt admitted after falling 15 feet from a ladder resulting in R rib fxs 9-12, R PTX, small mediastinal hematoma.   Clinical Impression   Pt is performing mobility and ADL at a set up to supervision level. All education completed. No further OT needs.    Follow Up Recommendations  No OT follow up    Equipment Recommendations  None recommended by OT    Recommendations for Other Services       Precautions / Restrictions Precautions Precautions: Fall Restrictions Weight Bearing Restrictions: No      Mobility Bed Mobility Overal bed mobility: Needs Assistance Bed Mobility: Rolling;Sidelying to Sit;Sit to Sidelying Rolling: Min guard Sidelying to sit: Min guard     Sit to sidelying: Min guard General bed mobility comments: max v/c's for splinting R side with pillow  Transfers Overall transfer level: Needs assistance   Transfers: Sit to/from Stand Sit to Stand: Supervision              Balance                                            ADL Overall ADL's : Needs assistance/impaired                                       General ADL Comments: Pt overall performing ADL at a set up to supervision level.     Vision     Perception     Praxis      Pertinent Vitals/Pain Pain Assessment: Faces Faces Pain Scale: Hurts even more Pain Location: R ribs Pain Descriptors / Indicators: Grimacing;Guarding Pain Intervention(s): Repositioned;Premedicated before session (instructed to splint ribs with pillow)     Hand Dominance Right   Extremity/Trunk Assessment Upper Extremity Assessment Upper Extremity Assessment: Overall WFL for tasks assessed   Lower Extremity Assessment Lower Extremity Assessment: Defer to PT evaluation   Cervical / Trunk  Assessment Cervical / Trunk Assessment: Other exceptions Cervical / Trunk Exceptions: rib and T11 fracture   Communication Communication Communication: No difficulties   Cognition Arousal/Alertness: Awake/alert Behavior During Therapy: WFL for tasks assessed/performed Overall Cognitive Status: Within Functional Limits for tasks assessed                     General Comments       Exercises       Shoulder Instructions      Home Living Family/patient expects to be discharged to:: Private residence Living Arrangements: Spouse/significant other Available Help at Discharge: Family;Available 24 hours/day Type of Home: House Home Access: Stairs to enter [employer] of Steps: 2 Entrance Stairs-Rails: None Home Layout: One level     Bathroom Shower/Tub: Occupational psychologist: Standard     Home Equipment: None          Prior Functioning/Environment Level of Independence: Independent        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        OT Problem List: Decreased activity tolerance;Pain   OT Treatment/Interventions:      OT Goals(Current goals can be found in the care plan section) Acute Rehab OT  Goals Patient Stated Goal: return home  OT Frequency:     Barriers to D/C:            Co-evaluation   Reason for Co-Treatment: For patient/therapist safety          End of Session    Activity Tolerance: Patient tolerated treatment well Patient left: in bed;with call bell/phone within reach   Time: 1001-1049 OT Time Calculation (min): 48 min Charges:  OT General Charges $OT Visit: 1 Procedure OT Evaluation $OT Eval Moderate Complexity: 1 Procedure OT Treatments $Self Care/Home Management : 8-22 mins G-Codes:    Malka So 12/02/2015, 1:05 PM  [phone]

## 2015-12-02 NOTE — Evaluation (Signed)
Physical Therapy Evaluation Patient Details Name: ROMELLE GLANTON MRN: [id number] DOB: 02/08/1935 Today's Date: 12/02/2015   History of Present Illness  Pt admitted after falling 15 feet from a ladder resulting in R rib fxs 9-12, R PTX, small mediastinal hematoma.  Clinical Impression  Pt functioning near baseline. Pt with most difficulty getting in/out of bed however improved with splinting technique. PT to see tomorrow for stair negotiation.    Follow Up Recommendations No PT follow up;Supervision - Intermittent    Equipment Recommendations  None recommended by PT    Recommendations for Other Services       Precautions / Restrictions Precautions Precautions: Fall Restrictions Weight Bearing Restrictions: No      Mobility  Bed Mobility Overal bed mobility: Needs Assistance Bed Mobility: Rolling;Sidelying to Sit;Sit to Sidelying Rolling: Min guard Sidelying to sit: Min guard     Sit to sidelying: Min guard General bed mobility comments: max v/c's for splinting R side with pillow  Transfers Overall transfer level: Needs assistance   Transfers: Sit to/from Stand Sit to Stand: Supervision         General transfer comment: increased time, wide base of support  Ambulation/Gait Ambulation/Gait assistance: Min guard (transitioned to supervision) Ambulation Distance (Feet): 300 Feet Assistive device: None Gait Pattern/deviations: Step-through pattern Gait velocity: guarded but safe Gait velocity interpretation: Below normal speed for age/gender General Gait Details: no episode of LOB, preferred to hold pillow to brace R ribs  Stairs            Wheelchair Mobility    Modified Rankin (Stroke Patients Only)       Balance Overall balance assessment: No apparent balance deficits (not formally assessed)                                           Pertinent Vitals/Pain Pain Assessment: Faces Faces Pain Scale: Hurts even more Pain  Location: R ribs Pain Descriptors / Indicators: Grimacing Pain Intervention(s): Other (comment) (instructed on bracing)    Home Living Family/patient expects to be discharged to:: Private residence Living Arrangements: Spouse/significant other Available Help at Discharge: Family;Available 24 hours/day Type of Home: House Home Access: Stairs to enter Entrance Stairs-Rails: None Entrance Stairs-Number of Steps: 2 Home Layout: One level Home Equipment: None      Prior Function Level of Independence: Independent         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     Hand Dominance   Dominant Hand: Right    Extremity/Trunk Assessment   Upper Extremity Assessment: Defer to OT evaluation           Lower Extremity Assessment: Overall WFL for tasks assessed      Cervical / Trunk Assessment: Other exceptions  Communication   Communication: No difficulties  Cognition Arousal/Alertness: Awake/alert Behavior During Therapy: WFL for tasks assessed/performed Overall Cognitive Status: Within Functional Limits for tasks assessed                      General Comments      Exercises     Assessment/Plan    PT Assessment Patient needs continued PT services  PT Problem List Decreased strength;Decreased range of motion;Decreased mobility;Decreased balance;Decreased activity tolerance;Decreased coordination          PT Treatment Interventions DME instruction;Gait training;Stair training;Functional mobility training;Therapeutic activities;Therapeutic exercise;Balance training    PT Goals (  Current goals can be found in the Care Plan section)  Acute Rehab PT Goals Patient Stated Goal: return home PT Goal Formulation: With patient Time For Goal Achievement: 12/09/15 Potential to Achieve Goals: Good    Frequency Min 3X/week   Barriers to discharge        Co-evaluation PT/OT/SLP Co-Evaluation/Treatment: Yes Reason for Co-Treatment: For  patient/therapist safety           End of Session   Activity Tolerance: Patient tolerated treatment well Patient left:  (in bathroom with OT) Nurse Communication: Mobility status         Time: 1004-1020 PT Time Calculation (min) (ACUTE ONLY): 16 min   Charges:   PT Evaluation $PT Eval Moderate Complexity: 1 Procedure     PT G CodesKingsley Callander 12/02/2015, 1:11 PM   Kittie Plater, PT, DPT Pager #: [phone] Office #: [phone]

## 2015-12-03 ENCOUNTER — Telehealth: Payer: Self-pay | Admitting: Cardiology

## 2015-12-03 ENCOUNTER — Other Ambulatory Visit: Payer: Self-pay

## 2015-12-03 DIAGNOSIS — Z8679 Personal history of other diseases of the circulatory system: Secondary | ICD-10-CM

## 2015-12-03 DIAGNOSIS — I10 Essential (primary) hypertension: Secondary | ICD-10-CM

## 2015-12-03 DIAGNOSIS — I471 Supraventricular tachycardia: Secondary | ICD-10-CM

## 2015-12-03 DIAGNOSIS — I480 Paroxysmal atrial fibrillation: Secondary | ICD-10-CM

## 2015-12-03 LAB — BASIC METABOLIC PANEL
ANION GAP: 7 (ref 5–15)
BUN: 14 mg/dL (ref 6–20)
CALCIUM: 8.7 mg/dL — AB (ref 8.9–10.3)
CO2: 26 mmol/L (ref 22–32)
CREATININE: 0.91 mg/dL (ref 0.61–1.24)
Chloride: 104 mmol/L (ref 101–111)
GFR calc Af Amer: >60 mL/min (ref >60)
GLUCOSE: 112 mg/dL — AB (ref 65–99)
Potassium: 3.1 mmol/L — ABNORMAL LOW (ref 3.5–5.1)
Sodium: 137 mmol/L (ref 135–145)

## 2015-12-03 MED ORDER — ATENOLOL 50 MG PO TABS
50.0000 mg | ORAL_TABLET | Freq: Once | ORAL | Status: AC
  Administered 2015-12-03: 50 mg via ORAL
  Filled 2015-12-03: qty 1

## 2015-12-03 MED ORDER — LOSARTAN POTASSIUM 25 MG PO TABS
25.0000 mg | ORAL_TABLET | Freq: Every day | ORAL | Status: DC
  Administered 2015-12-03: 25 mg via ORAL
  Filled 2015-12-03: qty 1

## 2015-12-03 MED ORDER — POTASSIUM CHLORIDE CRYS ER 20 MEQ PO TBCR
20.0000 meq | EXTENDED_RELEASE_TABLET | Freq: Two times a day (BID) | ORAL | Status: DC
  Administered 2015-12-04 – 2015-12-05 (×3): 20 meq via ORAL
  Filled 2015-12-03 (×3): qty 1

## 2015-12-03 MED ORDER — ENOXAPARIN SODIUM 40 MG/0.4ML ~~LOC~~ SOLN
40.0000 mg | Freq: Every day | SUBCUTANEOUS | Status: DC
  Administered 2015-12-03 – 2015-12-04 (×2): 40 mg via SUBCUTANEOUS
  Filled 2015-12-03 (×3): qty 0.4

## 2015-12-03 MED ORDER — ATENOLOL 25 MG PO TABS
50.0000 mg | ORAL_TABLET | Freq: Two times a day (BID) | ORAL | Status: DC
  Administered 2015-12-03 – 2015-12-05 (×4): 50 mg via ORAL
  Filled 2015-12-03 (×2): qty 1
  Filled 2015-12-03 (×2): qty 2

## 2015-12-03 MED ORDER — METOPROLOL TARTRATE 5 MG/5ML IV SOLN
INTRAVENOUS | Status: AC
  Filled 2015-12-03: qty 10

## 2015-12-03 MED ORDER — POTASSIUM CHLORIDE 10 MEQ/100ML IV SOLN
10.0000 meq | INTRAVENOUS | Status: AC
  Administered 2015-12-03 (×3): 10 meq via INTRAVENOUS
  Filled 2015-12-03 (×3): qty 100

## 2015-12-03 MED ORDER — METOPROLOL TARTRATE 5 MG/5ML IV SOLN
10.0000 mg | Freq: Once | INTRAVENOUS | Status: AC
  Administered 2015-12-03: 10 mg via INTRAVENOUS

## 2015-12-03 MED ORDER — METOPROLOL TARTRATE 50 MG PO TABS: 50.0000 mg | ORAL_TABLET | Freq: Two times a day (BID) | ORAL | Status: DC

## 2015-12-03 NOTE — Telephone Encounter (Signed)
Dr. Stanford Breed rounding - patient is current hospital admission.  Patient DPR lists self-only for communication of PHI.

## 2015-12-03 NOTE — Progress Notes (Signed)
Physical Therapy Treatment/ Discharge Patient Details Name: Travis Pruitt MRN: [id number] DOB: 1935/08/02 Today's Date: 12/29/15    History of Present Illness Pt admitted after falling 15 feet from a ladder resulting in R rib fxs 9-12, R PTX, small mediastinal hematoma.    PT Comments    Pt has returned to baseline functional status. Able to complete basic transfers, gait and stairs without assist or cueing. Pt encouraged to ambulate throughout the day. Pt agreeable to no further needs, will sign off.   Hr 59-64  Follow Up Recommendations  No PT follow up     Equipment Recommendations  None recommended by PT    Recommendations for Other Services       Precautions / Restrictions Precautions Precautions: Fall    Mobility  Bed Mobility Overal bed mobility: Modified Independent                Transfers Overall transfer level: Modified independent                  Ambulation/Gait Ambulation/Gait assistance: Modified independent (Device/Increase time) Ambulation Distance (Feet): 200 Feet Assistive device: None Gait Pattern/deviations: WFL(Within Functional Limits)   Gait velocity interpretation: Below normal speed for age/gender General Gait Details: slightly decreased gait speed with forward head but no LOB or difficulty with gait   Stairs Stairs: Yes Stairs assistance: Modified independent (Device/Increase time) Stair Management: One rail Right;Alternating pattern;Forwards Number of Stairs: 5    Wheelchair Mobility    Modified Rankin (Stroke Patients Only)       Balance Overall balance assessment: No apparent balance deficits (not formally assessed)                                  Cognition Arousal/Alertness: Awake/alert Behavior During Therapy: WFL for tasks assessed/performed Overall Cognitive Status: Within Functional Limits for tasks assessed                      Exercises      General Comments         Pertinent Vitals/Pain Pain Assessment: No/denies pain    Home Living                      Prior Function            PT Goals (current goals can now be found in the care plan section) Progress towards PT goals: Goals met/education completed, patient discharged from PT    Frequency           PT Plan Current plan remains appropriate    Co-evaluation             End of Session   Activity Tolerance: Patient tolerated treatment well Patient left: in bed;with call bell/phone within reach;with family/visitor present     Time: 1208-1221 PT Time Calculation (min) (ACUTE ONLY): 13 min  Charges:  $Gait Training: 8-22 mins                    G Codes:      Melford Aase 12/29/2015, 12:26 PM  Elwyn Reach, [place]

## 2015-12-03 NOTE — Progress Notes (Addendum)
0345: Pt with rhythm change, EKG shows SVT rate 140-160s. Pt alert and oriented, tolerating rhythm change well, and all other vital signs stable. Morning lab results show K of 3.1. Dr. Hulen Skains notified of rhythm change, lab results, and 2200 Atenolol dose held due to sinus bradycardia in the 50s. Verbal order to give 10 mg IV Lopresso now, start 3 runs of potassium, and to give evening dose of Atenolol now. Will implement orders and monitor patient.  [id number]: Pt converted to previous rhythm of sinus bradycardia in the mid 50s. BP 138/88. Second of three potassium infusing. Will continue to monitor.

## 2015-12-03 NOTE — Progress Notes (Signed)
  Subjective: SVT earlier this AM. Converted after atenolol and lopressor. No CP.  Objective: Vital signs in last 24 hours: Temp:  [98.1 F (36.7 C)-99.3 F (37.4 C)] 98.9 F (37.2 C) (11/02 0400) Pulse Rate:  [49-141] 56 (11/02 0800) Resp:  [14-26] 17 (11/02 0800) BP: (121-178)/(50-98) 161/77 (11/02 0800) SpO2:  [93 %-100 %] 98 % (11/02 0800) Last BM Date: 12/01/15  Intake/Output from previous day: 11/01 0701 - 11/02 0700 In: 780 [P.O.:420; IV Piggyback:300] Out: -  Intake/Output this shift: No intake/output data recorded.  General appearance: alert and cooperative Resp: clear to auscultation bilaterally Chest wall: right sided chest wall tenderness Cardio: regular rate and rhythm and 50s GI: soft, NT  Lab Results: CBC   Recent Labs  11/30/15 1445 12/01/15 0704  WBC 8.2 7.2  HGB 16.3 15.5  HCT 47.6 46.9  PLT 169 140*   BMET  Recent Labs  12/02/15 0246 12/03/15 0231  NA 138 137  K 3.3* 3.1*  CL 105 104  CO2 25 26  GLUCOSE 108* 112*  BUN 13 14  CREATININE 0.79 0.91  CALCIUM 8.5* 8.7*   PT/INR No results for input(s): LABPROT, INR in the last 72 hours. ABG No results for input(s): PHART, HCO3 in the last 72 hours.  Invalid input(s): PCO2, PO2  Studies/Results: Dg Chest Port 1 View  Result Date: 12/02/2015 CLINICAL DATA:  Right rib fracture. EXAM: PORTABLE CHEST 1 VIEW COMPARISON:  Radiographs of December 01, 2015. FINDINGS: The heart size and mediastinal contours are within normal limits. Both lungs are clear. No pneumothorax or pleural effusion is noted. Right lower rib fracture seen on prior exam is not well visualized currently. IMPRESSION: No acute cardiopulmonary abnormality seen. Right rib fracture seen on prior exam is not well visualized currently. Electronically Signed   By: Marijo Conception, M.D.   On: 12/02/2015 07:59    Anti-infectives: Anti-infectives    None      Assessment/Plan: Fall from ladder R rib FX 9-12 with small PTX -  pain control and pulm toilet, doing 1250cc on IS SVT - I consulted cardiology, continue atenolol HTN - atenolol as above, also needing PRN hydralazine, cardiology to see ? Aortic injury - F/U CT in 1 week per Dr. Cyndia Bent FEN - replace kypokalemia VTE - Lovenox DIspo - ICU pending above   LOS: 3 days    Georganna Skeans, MD, MPH, FACS Trauma: [phone] General Surgery: [phone]  11/2/2017Patient ID: Travis Pruitt, male   DOB: [birth year], 80 y.o.   MRN: [id number]

## 2015-12-03 NOTE — Telephone Encounter (Signed)
New Message  Pts wife voiced MD-Crenshaw visited her husband and would like to know when MD-Crenshaw would be in to visit husband so she can be there.  Please f/u with pt

## 2015-12-03 NOTE — Consult Note (Signed)
Cardiology Consult    Patient ID: Travis Pruitt MRN: [id number], DOB/AGE: [id number]   Admit date: 11/30/2015 Date of Consult: 12/03/2015  Primary Physician: Garret Reddish, MD Reason for Consult: HTN, SVT Primary Cardiologist: New, Dr. Stanford Breed Requesting Provider: Dr. Grandville Silos  Patient Profile    Travis Pruitt is a 80 year old male with a past medical history of HTN and gout. Also has a history of benign resting bradycardia. He presented to the ED on 11/30/15 with right sided chest pain and abdominal pain, he had fallen 15 feet off a ladder 2 days prior. He developed some SVT early in the am on 12/03/15 and was hypertensive as well, cardiology was consulted.   History of Present Illness    Mr. Travis Pruitt was on a ladder at his home cleaning the gutters when he fell approximately 15 feet. He remembers falling, but was apparently unconscious for some time and his wife returned home. He woke up when his wife pulled into the driveway. His wife drove him to Urgent Care and was found to have multple rib fractures and was given pain medication and released.   He continued to have pain in his chest so he went to the ED and was found to have right rib fractures 9-12 and a small right pneumothorax. TCTS was consulted as the patient was found to have a small filling defect in the wall of the aortic arch just opposite the origin of the left subclavian artery. TCTS recommends follow up CT in the next week and keep his BP under control.  He was hypertensive on arrival to the ED and remained hypertensive so he was started on a esmolol gtt. This has been titrated off. He takes atenolol 50mg  BID at home, and sees his primary care doctor who manges his BP. His atenolol was held on the night of 12/02/15 due to a HR of 56. While he was resting around 3 am this morning, he developed SVT with a rate of 160. He was given IV metoprolol and his po atenolol was given. His SVT resolved. EKG shows SVT,  however after review of telemetry it appears that he has some paroxysmal atrial fibrillation as well. He has never been told that he has a heart arrhythmia. He denies chest pain and SOB.   Past Medical History   Past Medical History:  Diagnosis Date  . Anxiety   . Colon polyp   . COLONIC POLYPS, ADENOMATOUS, HX OF 11/11/2003   Annotation: COLONOSCOPY 10/05 ([facility]) COLONOSCOPY 04/11/07 Qualifier: Diagnosis of  By: Carlean Purl MD, Dimas Millin Diverticulosis   . Gout   . Hx of adenomatous colonic polyps 01/12/2015  . Hyperlipidemia   . Hypertension     Past Surgical History:  Procedure Laterality Date  . COLONOSCOPY    . INGUINAL HERNIA REPAIR       Allergies  No Known Allergies  Inpatient Medications    . allopurinol  100 mg Oral Daily  . atenolol  50 mg Oral BID  . enoxaparin (LOVENOX) injection  40 mg Subcutaneous Daily  . mouth rinse  15 mL Mouth Rinse BID  . potassium chloride  20 mEq Oral BID    Family History    Family History  Problem Relation Age of Onset  . Cancer Mother     lung  . Colon cancer Neg Hx     Social History    Social History   Social History  . Marital status: Married  Spouse name: N/A  . Number of children: N/A  . Years of education: N/A   Occupational History  . Not on file.   Social History Main Topics  . Smoking status: Former Smoker    Quit date: 07/22/1970  . Smokeless tobacco: Never Used     Comment: 50 years ago  . Alcohol use 4.2 oz/week    7 Shots of liquor per week  . Drug use: No  . Sexual activity: Not on file   Other Topics Concern  . Not on file   Social History Narrative   Married     Review of Systems    General:  No chills, fever, night sweats or weight changes.  Cardiovascular:  No chest pain, dyspnea on exertion, edema, orthopnea, palpitations, paroxysmal nocturnal dyspnea. Dermatological: No rash, lesions/masses Respiratory: No cough, dyspnea Urologic: No hematuria, dysuria Abdominal:   No  nausea, vomiting, diarrhea, bright red blood per rectum, melena, or hematemesis Neurologic:  No visual changes, wkns, changes in mental status. All other systems reviewed and are otherwise negative except as noted above.  Physical Exam    Blood pressure (!) 146/68, pulse 63, temperature 98.6 F (37 C), temperature source Oral, resp. rate (!) 27, height 5\' 10"  (1.778 m), weight 194 lb 0.1 oz (88 kg), SpO2 97 %.  General: Pleasant, NAD Psych: Normal affect. Neuro: Alert and oriented X 3. Moves all extremities spontaneously. HEENT: Normal  Neck: Supple without bruits or JVD. Lungs:  Resp regular and unlabored, CTA. Chest wall tender  Heart: RRR no s3, s4, or murmurs. Abdomen: Soft, non-tender, non-distended, BS + x 4.  Extremities: No clubbing, cyanosis or edema. DP/PT/Radials 2+ and equal bilaterally.  Labs     Lab Results  Component Value Date   WBC 7.2 12/01/2015   HGB 15.5 12/01/2015   HCT 46.9 12/01/2015   MCV 97.5 12/01/2015   PLT 140 (L) 12/01/2015    Recent Labs Lab 11/30/15 1528  12/03/15 0231  NA 138  < > 137  K 4.0  < > 3.1*  CL 107  < > 104  CO2 26  < > 26  BUN 22*  < > 14  CREATININE 0.90  < > 0.91  CALCIUM 8.3*  < > 8.7*  PROT 6.3*  --   --   BILITOT 1.0  --   --   ALKPHOS 45  --   --   ALT 40  --   --   AST 28  --   --   GLUCOSE 107*  < > 112*  < > = values in this interval not displayed. Lab Results  Component Value Date   CHOL 177 11/24/2015   HDL 49.90 11/24/2015   LDLCALC 98 11/24/2015   TRIG 142.0 11/24/2015   No results found for: [facility]   Radiology Studies    Dg Chest 2 View  Result Date: 12/01/2015 CLINICAL DATA:  History of multiple rib fractures and pneumothorax. EXAM: CHEST  2 VIEW COMPARISON:  CT scan of the chest of November 30, 2015 FINDINGS: A known tiny right apical pneumothorax is not clearly evident today. The known right lower rib fractures are partially imaged. The lung volumes are low. There is minimal atelectasis at the  lung bases. There is no significant pleural effusion. The cardiac silhouette is enlarged. The pulmonary vascularity is normal. There is calcification in the wall of the aortic arch. IMPRESSION: The known tiny right apical pneumothorax is not clearly evident on today's study. There is  no significant pleural effusion. There is minimal bibasilar atelectasis. Known lower right rib fractures. Aortic atherosclerosis.  New Electronically Signed   By: David  Martinique M.D.   On: 12/01/2015 07:45   Ct Chest W Contrast  Result Date: 11/30/2015 CLINICAL DATA:  Fall from ladder EXAM: CT CHEST, ABDOMEN, AND PELVIS WITH CONTRAST TECHNIQUE: Multidetector CT imaging of the chest, abdomen and pelvis was performed following the standard protocol during bolus administration of intravenous contrast. CONTRAST:  125mL ISOVUE-300 IOPAMIDOL (ISOVUE-300) INJECTION 61% COMPARISON:  None. FINDINGS: CT CHEST FINDINGS Cardiovascular: There is no mediastinal hematoma. There is mild atherosclerotic calcification in the aorta. A focal area of decreased attenuation is noted in the aortic arch just distal to the left subclavian artery measuring 6 x 6 mm, possibly a small intramural hematoma. It is best seen on coronal slice 37 series 4 and sagittal slice 123456 series 5. No other mucosal lesions are evident in the aorta on this study. Pericardium is not thickened. No major vessel pulmonary embolus is evident. Mediastinum/Nodes: Thyroid appears unremarkable. There is no evident thoracic adenopathy. There are subcentimeter lymph nodes which do not meet size criteria for pathologic significance. There is a focal hiatal hernia. Lungs/Pleura: There is a right pleural effusion with atelectasis/ consolidation in the right lower lobe, primarily in the superior and posterior segments. There is mild atelectasis in the left base. There is a minimal pneumothorax on the right in the medial apex region. Somewhat greater soft tissue air is noted on the right. The  lungs elsewhere are clear. Musculoskeletal: There are fractures of the lateral right ninth, tenth, and eleventh ribs and posterior right twelfth rib, not significantly displaced. No other fractures are evident. Soft tissue air is noted on the right. There is degenerative change in the lower thoracic spine. CT ABDOMEN PELVIS FINDINGS Hepatobiliary: Multiple cysts are noted throughout the liver. Cysts range in size from as small as 3 mm to as large as 4.0 x 3.6 cm. This largest cyst is in the anterior segment right lobe near the dome. There is no perihepatic fluid. No liver laceration or rupture is evident. There is mild fatty infiltration near the fissure for the ligamentum teres. Gallbladder wall is not appreciably thickened. There is no biliary duct dilatation. Pancreas: No pancreatic mass or inflammatory focus is evident. There is no peripancreatic fluid. Spleen: Spleen appears intact without laceration or rupture. No focal splenic lesions are identified. There is no perisplenic fluid. Adrenals/Urinary Tract: Right adrenal is normal. There is a 1.1 x 1.0 cm left adrenal adenoma. There is a cyst arising from the lateral left kidney measuring 6.9 x 6.0 cm. No other focal renal lesions are identified. There is no hydronephrosis on either side. No renal laceration or rupture is evident. There is no perinephric fluid. No renal or ureteral calculus is identified. Urinary bladder is midline with wall thickness within normal limits. Stomach/Bowel: There are multiple colonic diverticulum, most notably in the distal transverse colon, descending colon, and splenic flexure regions. No evidence of diverticulitis. There is no bowel wall or mesenteric thickening. No bowel obstruction. No free air or portal venous air is evident. Vascular/Lymphatic: There is mild atherosclerotic calcification in the aorta. There is no abdominal aortic aneurysm. Major mesenteric vessels appear intact. There is no periaortic fluid. No adenopathy is  evident in the abdomen or pelvis. Reproductive: Prostate and seminal vesicles appear unremarkable. No pelvic mass or pelvic fluid collection is evident. Other: There is fat in each inguinal ring. Appendix appears normal. No ascites  or abscess is evident in the abdomen or pelvis. There is no abnormal peritoneal or retroperitoneal stranding or fluid collection. Musculoskeletal: There is degenerative change in the lumbar spine. No fractures are noted in the bones of the abdomen and pelvis. There is no intramuscular or abdominal wall region. IMPRESSION: CT chest: Fractures of the right ninth, tenth, eleventh, and twelfth ribs with small right apical pneumothorax and moderate soft tissue air laterally on the right. There is a right pleural effusion with consolidation/ atelectasis in the right base. There is mild posterior left base atelectasis. There is a small focus of decreased attenuation in the aortic arch, possibly a small intramural hematoma. No dissection or transsection seen. No mediastinal hematoma evident. No aneurysm. There is a focal hiatal hernia.  No adenopathy. CT abdomen and pelvis: Multiple liver cysts. Sizable cyst arising from left kidney. No traumatic appearing lesion identified in the abdomen or pelvis. Multiple colonic diverticula without diverticulitis. No inflammatory focus. No bowel obstruction. Appendix appears normal. No abscess. No fractures evident in the bones of the abdomen and pelvis. Small left adrenal adenoma. Critical Value/emergent results were called by telephone at the time of interpretation on 11/30/2015 at 1:42 pm to Dr. Merrily Pew , who verbally acknowledged these results. Electronically Signed   By: Lowella Grip III M.D.   On: 11/30/2015 13:42   Ct Abdomen Pelvis W Contrast  Result Date: 11/30/2015 CLINICAL DATA:  Fall from ladder EXAM: CT CHEST, ABDOMEN, AND PELVIS WITH CONTRAST TECHNIQUE: Multidetector CT imaging of the chest, abdomen and pelvis was performed  following the standard protocol during bolus administration of intravenous contrast. CONTRAST:  132mL ISOVUE-300 IOPAMIDOL (ISOVUE-300) INJECTION 61% COMPARISON:  None. FINDINGS: CT CHEST FINDINGS Cardiovascular: There is no mediastinal hematoma. There is mild atherosclerotic calcification in the aorta. A focal area of decreased attenuation is noted in the aortic arch just distal to the left subclavian artery measuring 6 x 6 mm, possibly a small intramural hematoma. It is best seen on coronal slice 37 series 4 and sagittal slice 123456 series 5. No other mucosal lesions are evident in the aorta on this study. Pericardium is not thickened. No major vessel pulmonary embolus is evident. Mediastinum/Nodes: Thyroid appears unremarkable. There is no evident thoracic adenopathy. There are subcentimeter lymph nodes which do not meet size criteria for pathologic significance. There is a focal hiatal hernia. Lungs/Pleura: There is a right pleural effusion with atelectasis/ consolidation in the right lower lobe, primarily in the superior and posterior segments. There is mild atelectasis in the left base. There is a minimal pneumothorax on the right in the medial apex region. Somewhat greater soft tissue air is noted on the right. The lungs elsewhere are clear. Musculoskeletal: There are fractures of the lateral right ninth, tenth, and eleventh ribs and posterior right twelfth rib, not significantly displaced. No other fractures are evident. Soft tissue air is noted on the right. There is degenerative change in the lower thoracic spine. CT ABDOMEN PELVIS FINDINGS Hepatobiliary: Multiple cysts are noted throughout the liver. Cysts range in size from as small as 3 mm to as large as 4.0 x 3.6 cm. This largest cyst is in the anterior segment right lobe near the dome. There is no perihepatic fluid. No liver laceration or rupture is evident. There is mild fatty infiltration near the fissure for the ligamentum teres. Gallbladder wall is  not appreciably thickened. There is no biliary duct dilatation. Pancreas: No pancreatic mass or inflammatory focus is evident. There is no peripancreatic fluid. Spleen:  Spleen appears intact without laceration or rupture. No focal splenic lesions are identified. There is no perisplenic fluid. Adrenals/Urinary Tract: Right adrenal is normal. There is a 1.1 x 1.0 cm left adrenal adenoma. There is a cyst arising from the lateral left kidney measuring 6.9 x 6.0 cm. No other focal renal lesions are identified. There is no hydronephrosis on either side. No renal laceration or rupture is evident. There is no perinephric fluid. No renal or ureteral calculus is identified. Urinary bladder is midline with wall thickness within normal limits. Stomach/Bowel: There are multiple colonic diverticulum, most notably in the distal transverse colon, descending colon, and splenic flexure regions. No evidence of diverticulitis. There is no bowel wall or mesenteric thickening. No bowel obstruction. No free air or portal venous air is evident. Vascular/Lymphatic: There is mild atherosclerotic calcification in the aorta. There is no abdominal aortic aneurysm. Major mesenteric vessels appear intact. There is no periaortic fluid. No adenopathy is evident in the abdomen or pelvis. Reproductive: Prostate and seminal vesicles appear unremarkable. No pelvic mass or pelvic fluid collection is evident. Other: There is fat in each inguinal ring. Appendix appears normal. No ascites or abscess is evident in the abdomen or pelvis. There is no abnormal peritoneal or retroperitoneal stranding or fluid collection. Musculoskeletal: There is degenerative change in the lumbar spine. No fractures are noted in the bones of the abdomen and pelvis. There is no intramuscular or abdominal wall region. IMPRESSION: CT chest: Fractures of the right ninth, tenth, eleventh, and twelfth ribs with small right apical pneumothorax and moderate soft tissue air laterally on  the right. There is a right pleural effusion with consolidation/ atelectasis in the right base. There is mild posterior left base atelectasis. There is a small focus of decreased attenuation in the aortic arch, possibly a small intramural hematoma. No dissection or transsection seen. No mediastinal hematoma evident. No aneurysm. There is a focal hiatal hernia.  No adenopathy. CT abdomen and pelvis: Multiple liver cysts. Sizable cyst arising from left kidney. No traumatic appearing lesion identified in the abdomen or pelvis. Multiple colonic diverticula without diverticulitis. No inflammatory focus. No bowel obstruction. Appendix appears normal. No abscess. No fractures evident in the bones of the abdomen and pelvis. Small left adrenal adenoma. Critical Value/emergent results were called by telephone at the time of interpretation on 11/30/2015 at 1:42 pm to Dr. Merrily Pew , who verbally acknowledged these results. Electronically Signed   By: Lowella Grip III M.D.   On: 11/30/2015 13:42   Dg Chest Port 1 View  Result Date: 12/02/2015 CLINICAL DATA:  Right rib fracture. EXAM: PORTABLE CHEST 1 VIEW COMPARISON:  Radiographs of December 01, 2015. FINDINGS: The heart size and mediastinal contours are within normal limits. Both lungs are clear. No pneumothorax or pleural effusion is noted. Right lower rib fracture seen on prior exam is not well visualized currently. IMPRESSION: No acute cardiopulmonary abnormality seen. Right rib fracture seen on prior exam is not well visualized currently. Electronically Signed   By: Marijo Conception, M.D.   On: 12/02/2015 07:59    EKG & Cardiac Imaging    EKG: SVT  Telemetry shows afib, rapid at times.    Assessment & Plan    1. Paroxysmal supraventricular tachycardia/ Paroxsymal atrial tachycardia: Patient had SVT last night confirmed with EKG, however after reviewing his telemetry with Dr. Stanford Breed it appears that he had some rapid afib as well. He denies chest pain  and palpitations overnight.   Will continue his home  atenolol, and schedule a dose for now and again tonight at 10pm.  This patients CHA2DS2-VASc Score and unadjusted Ischemic Stroke Rate (% per year) is equal to 2.2 % stroke rate/year from a score of 2 Above score calculated as 1 point each if present [CHF, HTN, DM, Vascular=MI/PAD/Aortic Plaque, Age if 65-74, or Male], 2 points each if present [Age > 75, or Stroke/TIA/TE] No need for anti-coagulation at this time.   Will also order Echocardiogram.   2. Essential HTN: Hypertensive in the setting of pain at initial presentation, but does remain hypertensive now. Would like to add ARB, would normally like to add more beta blocker for shear force reduction in the setting of questionable intramural aortic hematoma, but his resting bradycardia prevents this.  Start losartan 25mg  daily with plans to titrate up.   3. Questionable intramural aortic arch hematoma: Per TCTS, "there is a small filling defect in th wall of the aortic arch but it is not a dissection or transection, and there is no hematoma around the aorta but could be a intramural hematoma also could be non calcified plaque."  He will have a follow up CT chest in about a week to reevaluate.   Signed, Arbutus Leas, NP 12/03/2015, 1:32 PM Pager: [phone] As above, patient seen and examined. Briefly he is a 80 year old male with past medical history of hypertension and now status post fall resulting in rib fractures who we are asked to evaluate for paroxysmal atrial fibrillation and SVT. Patient was on his ladder installing gutters guards. He fell and was noted to have rib fractures and small right apical pneumothorax on the right. There was also a possible small intramural hematoma in the aortic arch. Patient was noted to have intermittent SVT and atrial fibrillation on telemetry and cardiology asked to evaluate. Prior to his event he was not having dyspnea, chest pain, palpitations or  syncope. He had no palpitations with his atrial fibrillation. He is presently in sinus rhythm. TSH October 24 normal. Electrocardiogram shows sinus bradycardia with first-degree AV block. Additional electrocardiogram shows probable ectopic atrial tachycardia with ST depression in the anterolateral and inferior leads. Review of telemetry shows paroxysmal atrial fibrillation as well.  1 paroxysmal atrial fibrillation-patient is noted to have both SVT (likely ectopic atrial tachycardia) and paroxysmal atrial fibrillation on telemetry. His beta blocker was held because of sinus bradycardia. I would resume atenolol 50 mg twice a day for rate control if atrial arrhythmias recur. His TSH was normal. Check echocardiogram. Embolic risk factors include age greater than 16 and hypertension. CHADsvasc 3. He will ultimately require anticoagulation. However I would not pursue this at present given recent trauma and question of intramural hematoma in aortic arch.  2 hypertension-blood pressure is elevated. Resume atenolol 50 mg twice a day. We are also adding low-dose ARB. We'll follow blood pressure and adjust regimen as needed.  3 ECG changes with tachycardia-patient noted to have ST depression in the inferior and anterolateral leads with tachycardia. I will likely arrange a functional study after he recovers from his fall.  4 rib fractures/pneumothorax-management per surgery.  Kirk Ruths MD

## 2015-12-04 LAB — BASIC METABOLIC PANEL
Anion gap: 7 (ref 5–15)
BUN: 14 mg/dL (ref 6–20)
CALCIUM: 8.9 mg/dL (ref 8.9–10.3)
CO2: 26 mmol/L (ref 22–32)
CREATININE: 0.95 mg/dL (ref 0.61–1.24)
Chloride: 106 mmol/L (ref 101–111)
GFR calc non Af Amer: >60 mL/min (ref >60)
GLUCOSE: 112 mg/dL — AB (ref 65–99)
Potassium: 3.7 mmol/L (ref 3.5–5.1)
Sodium: 139 mmol/L (ref 135–145)

## 2015-12-04 MED ORDER — LOSARTAN POTASSIUM 50 MG PO TABS
50.0000 mg | ORAL_TABLET | Freq: Every day | ORAL | Status: DC
  Administered 2015-12-04 – 2015-12-05 (×2): 50 mg via ORAL
  Filled 2015-12-04 (×2): qty 1

## 2015-12-04 NOTE — Progress Notes (Signed)
Patient arrived to 2W room 32 in a wheelchair in no apparent distress.  Telemetry monitor applied and CCMD notified.  Patient oriented to unit and room to include phone and call light.  Will continue to monitor.

## 2015-12-04 NOTE — Progress Notes (Signed)
    Subjective:  Denies CP or dyspnea; some rib pain with movements.   Objective:  Vitals:   12/04/15 0400 12/04/15 0500 12/04/15 0600 12/04/15 0700  BP: (!) 141/79 (!) 173/87 (!) 167/80 (!) 141/68  Pulse: (!) 52 (!) 58 (!) 50 (!) 51  Resp: (!) 21 15 16 18   Temp:      TempSrc:      SpO2: 95% 98% 96% 96%  Weight: 190 lb 14.7 oz (86.6 kg)     Height:        Intake/Output from previous day:  Intake/Output Summary (Last 24 hours) at 12/04/15 [id number] Last data filed at 12/03/15 2000  Gross per 24 hour  Intake              540 ml  Output                0 ml  Net              540 ml    Physical Exam: Physical exam: Well-developed well-nourished in no acute distress.  Skin is warm and dry.  HEENT is normal.  Neck is supple.  Chest is clear to auscultation with normal expansion.  Cardiovascular exam is regular rate and rhythm.  Abdominal exam nontender or distended. No masses palpated. Extremities show no edema. neuro grossly intact    Lab Results: Basic Metabolic Panel:  Recent Labs  12/02/15 0246 12/03/15 0231  NA 138 137  K 3.3* 3.1*  CL 105 104  CO2 25 26  GLUCOSE 108* 112*  BUN 13 14  CREATININE 0.79 0.91  CALCIUM 8.5* 8.7*     Assessment/Plan:  80 year old male with past medical history of hypertension admitted with rib fractures and pneumothorax after falling off ladder and developed SVT/paroxysmal atrial fibrillation on telemetry.  1 paroxysmal atrial fibrillation-patient is noted to have both SVT (likely ectopic atrial tachycardia) and paroxysmal atrial fibrillation on telemetry previously. No further arrhythmia since beta blocker resumed; will continue. His TSH was normal. Check echocardiogram. Embolic risk factors include age greater than 29 and hypertension. CHADsvasc 3. He will ultimately require anticoagulation. However I would not pursue this at present given recent trauma and question of intramural hematoma in aortic arch. Await fu echo.   2  hypertension-blood pressure is elevated. Continue atenolol; increase cozaar to 50 mg daily. We'll follow blood pressure and adjust regimen as needed.  3 ECG changes with tachycardia-patient noted to have ST depression in the inferior and anterolateral leads with tachycardia. I will likely arrange a functional study after he recovers from his fall.  4 rib fractures/pneumothorax-management per surgery.   Kirk Ruths 12/04/2015, 8:12 AM

## 2015-12-04 NOTE — Progress Notes (Signed)
  Subjective: Doing well, good pain control  Objective: Vital signs in last 24 hours: Temp:  [98.2 F (36.8 C)-98.6 F (37 C)] 98.2 F (36.8 C) (11/02 1635) Pulse Rate:  [50-67] 51 (11/03 0700) Resp:  [15-31] 18 (11/03 0700) BP: (131-193)/(60-144) 141/68 (11/03 0700) SpO2:  [95 %-100 %] 96 % (11/03 0700) Weight:  [86.6 kg (190 lb 14.7 oz)] 86.6 kg (190 lb 14.7 oz) (11/03 0400) Last BM Date: 12/04/15  Intake/Output from previous day: 11/02 0701 - 11/03 0700 In: 540 [P.O.:540] Out: -  Intake/Output this shift: No intake/output data recorded.  General appearance: cooperative Resp: clear to auscultation bilaterally Cardio: RRR 50s GI: soft, NT  Lab Results: CBC  No results for input(s): WBC, HGB, HCT, PLT in the last 72 hours. BMET  Recent Labs  12/02/15 0246 12/03/15 0231  NA 138 137  K 3.3* 3.1*  CL 105 104  CO2 25 26  GLUCOSE 108* 112*  BUN 13 14  CREATININE 0.79 0.91  CALCIUM 8.5* 8.7*   Assessment/Plan: Fall from ladder R rib FX 9-12 with small PTX - pain control and pulm toilet, doing great on IS SVT - no further episodes, appreciate cardiology eval, continue atenolol HTN - atenolol as above, cards added Cozaar ? Aortic injury - F/U CT in 1 week per Dr. Cyndia Bent FEN - labs P VTE - Lovenox DIspo - tele   LOS: 4 days    Georganna Skeans, MD, MPH, FACS Trauma: [phone] General Surgery: [phone]  11/3/2017Patient ID: Travis Pruitt, male   DOB: [birth year], 80 y.o.   MRN: [id number]

## 2015-12-05 ENCOUNTER — Other Ambulatory Visit: Payer: Self-pay | Admitting: Physician Assistant

## 2015-12-05 ENCOUNTER — Other Ambulatory Visit (HOSPITAL_COMMUNITY): Payer: 59

## 2015-12-05 DIAGNOSIS — I480 Paroxysmal atrial fibrillation: Secondary | ICD-10-CM

## 2015-12-05 LAB — BASIC METABOLIC PANEL
ANION GAP: 7 (ref 5–15)
BUN: 17 mg/dL (ref 6–20)
CALCIUM: 8.7 mg/dL — AB (ref 8.9–10.3)
CO2: 26 mmol/L (ref 22–32)
Chloride: 107 mmol/L (ref 101–111)
Creatinine, Ser: 0.97 mg/dL (ref 0.61–1.24)
GFR calc Af Amer: >60 mL/min (ref >60)
Glucose, Bld: 105 mg/dL — ABNORMAL HIGH (ref 65–99)
POTASSIUM: 3.7 mmol/L (ref 3.5–5.1)
SODIUM: 140 mmol/L (ref 135–145)

## 2015-12-05 LAB — MAGNESIUM: MAGNESIUM: 1.8 mg/dL (ref 1.7–2.4)

## 2015-12-05 MED ORDER — LOSARTAN POTASSIUM 50 MG PO TABS: 50.0000 mg | ORAL_TABLET | Freq: Every day | ORAL | 1 refills | Status: DC

## 2015-12-05 MED ORDER — OXYCODONE HCL 5 MG PO TABS: 5.0000 mg | ORAL_TABLET | ORAL | 0 refills | Status: DC | PRN

## 2015-12-05 MED ORDER — ATENOLOL 50 MG PO TABS: 50.0000 mg | ORAL_TABLET | Freq: Two times a day (BID) | ORAL | 1 refills | Status: DC

## 2015-12-05 NOTE — Progress Notes (Signed)
  Subjective: Pt doing well today Ready for home.  Objective: Vital signs in last 24 hours: Temp:  [97.4 F (36.3 C)-98.5 F (36.9 C)] 98.5 F (36.9 C) (11/04 0416) Pulse Rate:  [57-66] 63 (11/04 0416) Resp:  [18-26] 20 (11/04 0416) BP: (127-171)/(50-89) 139/79 (11/04 0416) SpO2:  [97 %-100 %] 98 % (11/04 0416) Last BM Date: 12/04/15  Intake/Output from previous day: 11/03 0701 - 11/04 0700 In: 360 [P.O.:360] Out: -  Intake/Output this shift: No intake/output data recorded.  General appearance: alert and cooperative Cardio: regular rate and rhythm, S1, S2 normal, no murmur, click, rub or gallop GI: soft, non-tender; bowel sounds normal; no masses,  no organomegaly  Lab Results:  No results for input(s): WBC, HGB, HCT, PLT in the last 72 hours. BMET  Recent Labs  12/04/15 0747 12/05/15 0401  NA 139 140  K 3.7 3.7  CL 106 107  CO2 26 26  GLUCOSE 112* 105*  BUN 14 17  CREATININE 0.95 0.97  CALCIUM 8.9 8.7*   PT/INR No results for input(s): LABPROT, INR in the last 72 hours. ABG No results for input(s): PHART, HCO3 in the last 72 hours.  Invalid input(s): PCO2, PO2  Studies/Results: No results found.  Anti-infectives: Anti-infectives    None      Assessment/Plan:  Fall from ladder R rib FX 9-12 with small PTX - pain control and pulm toilet, doing great on IS SVT - no further episodes, appreciate cardiology eval, continue atenolol HTN - atenolol as above, cards added Cozaar ? Aortic injury - F/U CT in 1 week per Dr. Cyndia Bent FEN - labs P VTE - Lovenox DIspo - home today  LOS: 5 days    Rosario Jacks., [facility] 12/05/2015

## 2015-12-05 NOTE — Progress Notes (Signed)
    Subjective:  Denies CP or dyspnea   Objective:  Vitals:   12/04/15 1041 12/04/15 1336 12/04/15 2232 12/05/15 0416  BP: (!) 144/50 (!) 127/53 (!) 171/74 139/79  Pulse: 66 65 (!) 57 63  Resp: 18 18 20 20   Temp:  97.8 F (36.6 C) 98.3 F (36.8 C) 98.5 F (36.9 C)  TempSrc:  Oral Oral Oral  SpO2: 97% 100% 99% 98%  Weight:      Height:        Intake/Output from previous day:  Intake/Output Summary (Last 24 hours) at 12/05/15 0935 Last data filed at 12/04/15 1700  Gross per 24 hour  Intake              360 ml  Output                0 ml  Net              360 ml    Physical Exam: Physical exam: Well-developed well-nourished in no acute distress.  Skin is warm and dry.  HEENT is normal.  Neck is supple.  Chest is clear to auscultation with normal expansion.  Cardiovascular exam is regular rate and rhythm.  Abdominal exam nontender or distended. No masses palpated. Extremities show no edema. neuro grossly intact    Lab Results: Basic Metabolic Panel:  Recent Labs  12/04/15 0747 12/05/15 0401  NA 139 140  K 3.7 3.7  CL 106 107  CO2 26 26  GLUCOSE 112* 105*  BUN 14 17  CREATININE 0.95 0.97  CALCIUM 8.9 8.7*  MG  --  1.8     Assessment/Plan:  80 year old male with past medical history of hypertension admitted with rib fractures and pneumothorax after falling off ladder and developed SVT/paroxysmal atrial fibrillation on telemetry.  1 paroxysmal atrial fibrillation-patient is noted to have both SVT (likely ectopic atrial tachycardia) and paroxysmal atrial fibrillation on telemetry previously (also recurrence over last 24 hrs but in sinus this AM). Continue atenolol. His TSH was normal. Echocardiogram not performed; will arrange as outpt. Embolic risk factors include age greater than 32 and hypertension. CHADsvasc 3. He will ultimately require anticoagulation. However I would not pursue this at present given recent trauma and question of intramural hematoma  in aortic arch. Hematoma to be reassessed in one week per primary service. Begin eliquis when it is clear this has resolved.  2 hypertension-continue present BP meds at DC. We'll follow blood pressure and adjust regimen as needed.  3 ECG changes with tachycardia-patient noted to have ST depression in the inferior and anterolateral leads with tachycardia. I will likely arrange a functional study after he recovers from his fall as out pt. No H/O exertional CP.  4 rib fractures/pneumothorax-management per surgery.  Will arrange TOC appt in our office in 1 week and Fu with me in 8 weeks.  Travis Pruitt 12/05/2015, 9:35 AM

## 2015-12-06 NOTE — Progress Notes (Signed)
Pt asymptomatic and resting, pt having frequent episodes of non-sustained SVT and A Fib with highest rate of 160 at rest.  On-call MD notified and ordered magnesium, metabolic panel, and am EKG.  RN will continue to monitor patient for changes. Patient call bell within reach, bed in lowest position.    Izola Price, RN 12/05/15

## 2015-12-07 ENCOUNTER — Telehealth: Payer: Self-pay

## 2015-12-07 NOTE — Telephone Encounter (Signed)
Thanks for setting TCM up

## 2015-12-07 NOTE — Telephone Encounter (Signed)
Pt fell off ladder ~80ft to ground. Pt reports +LOC >50mins. Pt was seen at [facility] 10/28/`7 and diagnosed with multiple right rib fx's. Pt was still in significant pain and advised by [facility] to seek emergency evaluation.   Spoke with pt and he states that he is dong very well. He has no complaints other than rib fx pain. He is not taking pain medication. Pt feels that he is improving daily. Pt also reports significant large soft tissue bruising to right side.   Pt scheduled with Dr. Yong Channel 12/10/15.     Transition Care Management Follow-up Telephone Call  How have you been since you were released from the hospital? good   Do you understand why you were in the hospital? yes   Do you understand the discharge instrcutions? yes  Items Reviewed:  Medications reviewed: yes  Allergies reviewed: yes  Dietary changes reviewed: yes  Referrals reviewed: yes   Functional Questionnaire:   Activities of Daily Living (ADLs):   He states they are independent in the following: ambulation, bathing and hygiene, feeding, continence, grooming, toileting and dressing States they require assistance with the following: none   Any transportation issues/concerns?: no   Any patient concerns? no   Confirmed importance and date/time of follow-up visits scheduled: yes   Confirmed with patient if condition begins to worsen call PCP or go to the ER.  Patient was given the Call-a-Nurse line [phone]: yes

## 2015-12-08 ENCOUNTER — Encounter: Payer: Self-pay | Admitting: Physician Assistant

## 2015-12-09 ENCOUNTER — Telehealth: Payer: Self-pay | Admitting: Family Medicine

## 2015-12-09 NOTE — Telephone Encounter (Signed)
Spoke with pt and he states that he purchased home blood pressure monitor. He has gotten readings that are abnormally high for him. He has had his wife return the new machine to exchange for another one in case it was faulty. He will then retake his blood pressure and notify office if it continues to be high. Advised pt that if he continues to get abnormally high reading he would need to be seen in clinic today. Pt agrees.   Pt denies chest pain, headache, arm/jaw pain or n/v. Pt states that he feels "great".   Pt will call back if abnormal readings continue. Nothing further needed at this time.

## 2015-12-09 NOTE — Telephone Encounter (Signed)
Patient Name: Travis Pruitt  DOB: 26-Apr-1935    Initial Comment Caller states d/c Sunday after accident 8 days ago; monitoring BP, getting readings of over 200, wife's readings are reasonable; 4 broken ribs, bruised aorta after ladder collapsed.    Nurse Assessment  Nurse: Leilani Merl, RN, Heather Date/Time (Eastern Time): 12/09/2015 9:09:23 AM  Confirm and document reason for call. If symptomatic, describe symptoms. You must click the next button to save text entered. ---Caller states d/c Sunday after accident 8 days ago; monitoring BP, last night his BP was 200/100, wife's readings are reasonable; 4 broken ribs, bruised aorta after ladder collapsed.  Has the patient traveled out of the country within the last 30 days? ---Not Applicable  Does the patient have any new or worsening symptoms? ---Yes  Will a triage be completed? ---Yes  Related visit to physician within the last 2 weeks? ---No  Does the PT have any chronic conditions? (i.e. diabetes, asthma, etc.) ---Yes  List chronic conditions. ---See MR  Is this a behavioral health or substance abuse call? ---No     Guidelines    Guideline Title Affirmed Question Affirmed Notes  High Blood Pressure BP ? 180/110    Final Disposition User   See Physician within 24 Hours Standifer, RN, Water quality scientist    Comments  caller states that he has an appt with Dr. Retail banker already tomorrow, but I think he needs to come in for a BP check today, last night he got 200/100 and today, he is just getting error messages. Please call him and let him know what he needs to do today.   Referrals  REFERRED TO PCP OFFICE   Disagree/Comply: Comply

## 2015-12-10 ENCOUNTER — Encounter: Payer: Self-pay | Admitting: Family Medicine

## 2015-12-10 ENCOUNTER — Ambulatory Visit (INDEPENDENT_AMBULATORY_CARE_PROVIDER_SITE_OTHER): Payer: 59 | Admitting: Family Medicine

## 2015-12-10 VITALS — BP 136/82 | HR 66 | Temp 97.7°F | Wt 189.4 lb

## 2015-12-10 DIAGNOSIS — S270XXA Traumatic pneumothorax, initial encounter: Secondary | ICD-10-CM

## 2015-12-10 DIAGNOSIS — I10 Essential (primary) hypertension: Secondary | ICD-10-CM | POA: Diagnosis not present

## 2015-12-10 DIAGNOSIS — T148XXA Other injury of unspecified body region, initial encounter: Secondary | ICD-10-CM

## 2015-12-10 DIAGNOSIS — I480 Paroxysmal atrial fibrillation: Secondary | ICD-10-CM | POA: Diagnosis not present

## 2015-12-10 DIAGNOSIS — S2241XA Multiple fractures of ribs, right side, initial encounter for closed fracture: Secondary | ICD-10-CM

## 2015-12-10 NOTE — Progress Notes (Signed)
Subjective:  Travis Pruitt is a 80 y.o. year old very pleasant male patient who presents for transitional care management and hospital follow up for multiple rib fracture with ? Intramural aortic arch hematoma and development of atrial fibrillation, after a fall off 15 foot ladder . Patient was hospitalized from 11/30/15 to 12/05/15. A TCM phone call was completed on 12/07/15. Medical complexity moderate  Patient is a 80 year old male who fell from a 15 foot ladder onto his right side and sustained multiple rib fractures. He was unconscious for at least 30 minutes. Originally seen in urgent care but discharged home- percocet did not help pain so he returned to ED.  He was admitted for rib fractures x4 9-12 but was also found to have right apical pneumothorax, right pleural effusion and concern for small intramural hematoma in the aortic arch. Thought necessary to be evaluated by CT surgery. Dr. Cyndia Bent saw the patient the next day and stated that " He has a small filling defect in the wall of the aortic arch just opposite the origin of the left subclavian artery. Etiology of this is unclear but it is small. It is not a dissection or transection and there is no hematoma around the aorta. It could be an intramural hematoma but I would expect some thickening of the aortic wall there. It could be non-calcified plaque. It could be artifact. I don't think there is anything to do about it except keep his BP under good control and do a follow up scan in about a week. I suspect that his markedly elevated BP now is due to pain from the rib fractures. " While in the hospital- atenolol initially held due to bradycardia in 50s which has been his baseline for some time but patient later developed SVT and atrial fibrillation prompting cardiology consult. Converted back to sinus on atenolol. Plan was to start anticoagulation though with questionable intramural aortic arch hematoma- plan to postpone this until after follow up  CT potentially. CHADSVASC score of 3. Given possible intramural aortic arch hematoma- would have preferred to increase beta blocker to reduce shear force but thought heart rate would not tolerate. TSH normal. Outpatient echocardiogram planned. For hypertension- started on losartan 50mg  for further BP control. Patient also had some St depression with his tachycardia with plan for potential stress testing after recovery. On follow up x-rays apical pneumothorax not visible - pleural effusion not noted either.   ROS- continued rib pain but not requiring pain medicine, no chest pain or shortnes of breath. Does have rib pain with deep breaths.see any ROS included in HPI as well.   Past Medical History-  Patient Active Problem List   Diagnosis Date Noted  . Generalized anxiety disorder 11/27/2007    Priority: Medium  . Hyperlipidemia 10/25/2006    Priority: Medium  . Gout 10/25/2006    Priority: Medium  . Essential hypertension 10/25/2006    Priority: Medium  . Paroxysmal supraventricular tachycardia ([place]) 12/03/2015  . Multiple rib fractures 11/30/2015  . Hx of adenomatous colonic polyps 01/12/2015    Medications- reviewed and updated Current Outpatient Prescriptions  Medication Sig Dispense Refill  . allopurinol (ZYLOPRIM) 100 MG tablet Take 100 mg by mouth daily.    Marland Kitchen atenolol (TENORMIN) 50 MG tablet Take 1 tablet (50 mg total) by mouth 2 (two) times daily. 30 tablet 1  . ibuprofen (ADVIL,MOTRIN) 200 MG tablet Take 400 mg by mouth every 6 (six) hours as needed for moderate pain.    Marland Kitchen  losartan (COZAAR) 50 MG tablet Take 1 tablet (50 mg total) by mouth daily. 30 tablet 1   No current facility-administered medications for this visit.     Objective: BP 136/82 (BP Location: Left Arm, Patient Position: Sitting, Cuff Size: Large)   Pulse 66   Temp 97.7 F (36.5 C) (Oral)   Wt 189 lb 6.4 oz (85.9 kg)   SpO2 96%   BMI 27.18 kg/m  Gen: NAD, resting comfortably CV: RRR no murmurs rubs or  gallops Lungs: CTAB no crackles, wheeze, rhonchi. Some pain with deep inspiration Abdomen: soft/nontender/nondistended/normal bowel sounds. No rebound or guarding. MSK: extensive bruising 9 x 11 inch area on right side. Moderately tender with palpation over this area Ext: no edema Skin: warm, dry, no rash  Assessment/Plan:  Rib fractures- pain well controlled at this point even without medication. They ask me about calcium/vitamin d recommended by urgent care. Encouraged consider calcium through diet (given handout) and vitamin D supplement [phone] IU per day.   Hypertension- well controlled with addition of losartan 50mg  to atenolol. He got a new home cuff and as we helped wife fit this appropriately to his arm- noted good control similar to level we got today.   Atrial fibrillation, paroxysmal- appears in sinus today. May have been triggered by stress of recent injury but nonetheless continue atenolol for rate control if goes back into a fib and likely start anticoagulation after repeat CT  Possible intramural aortic arch hematoma- patient has not called cardiothoracic surgery- advised patient to call tomorrow to get set up for CT next week  Pneumothorax- not visible on follow up films  Return precautions advised.  Garret Reddish, MD

## 2015-12-10 NOTE — Progress Notes (Signed)
Pre visit review using our clinic review tool, if applicable. No additional management support is needed unless otherwise documented below in the visit note. 

## 2015-12-10 NOTE — Patient Instructions (Signed)
Follow up with cardiology tomorrow  Call Dr. Cyndia Bent tomorrow to figure out follow up plan for next week  Blood pressure looks great- good job getting the new cuff and glad #s are looking better  Do not hesitate to contact us if you have questions  If you want to use mychart, our online portal that can be an easy way to get access to [place]

## 2015-12-11 ENCOUNTER — Telehealth (HOSPITAL_COMMUNITY): Payer: Self-pay

## 2015-12-11 ENCOUNTER — Encounter: Payer: Self-pay | Admitting: Physician Assistant

## 2015-12-11 ENCOUNTER — Ambulatory Visit (INDEPENDENT_AMBULATORY_CARE_PROVIDER_SITE_OTHER): Payer: 59 | Admitting: Physician Assistant

## 2015-12-11 VITALS — BP 149/73 | HR 63 | Ht 70.0 in | Wt 188.2 lb

## 2015-12-11 DIAGNOSIS — R938 Abnormal findings on diagnostic imaging of other specified body structures: Secondary | ICD-10-CM | POA: Diagnosis not present

## 2015-12-11 DIAGNOSIS — I10 Essential (primary) hypertension: Secondary | ICD-10-CM

## 2015-12-11 DIAGNOSIS — I480 Paroxysmal atrial fibrillation: Secondary | ICD-10-CM | POA: Diagnosis not present

## 2015-12-11 DIAGNOSIS — R9389 Abnormal findings on diagnostic imaging of other specified body structures: Secondary | ICD-10-CM

## 2015-12-11 DIAGNOSIS — I471 Supraventricular tachycardia: Secondary | ICD-10-CM

## 2015-12-11 MED ORDER — LOSARTAN POTASSIUM 100 MG PO TABS: 100.0000 mg | ORAL_TABLET | Freq: Every day | ORAL | 3 refills | Status: DC

## 2015-12-11 NOTE — Telephone Encounter (Signed)
Returned call to patient. No answer, left voicemail asking that he call back to make appointment.

## 2015-12-11 NOTE — Progress Notes (Signed)
Cardiology Office Note    Date:  12/11/2015   ID:  Travis Pruitt, DOB 1935/11/12, MRN [id number]  PCP:  Garret Reddish, MD  Cardiologist:  Dr. Stanford Breed  Chief Complaint  Patient presents with  . Hospitalization Follow-up    Seen by Dr. Stanford Breed    History of Present Illness:  Travis Pruitt is a 80 y.o. male with PMH of benign resting bradycardia, hypertension and gout who presented to the emergency room on 11/30/2015 with a right sided chest pain and abdominal pain. He had fallen 15 feet off of the ladder 2 days prior. He apparently was unconscious for 30-45 minutes and only woke up to the sound of his wife's car pulling into the driveway. He was seen in the urgent care and found to have multiple rib fractures and given pain medication and released. He continued to have pain in his chest so he sought medical attention in the ED and found to have right rib fracture 9 through 12 and small right pneumothorax. Cardiothoracic surgery was consulted as patient found to have a small filling defect in the wall of aortic arch just opposite the origin of the left subclavian artery concerning for intramural aortic arch hematoma, although it was not a dissection or transection. TCTS recommended follow-up CT in the next week and to keep his blood pressure under control. In the hospital, his atenolol was held due to a heart rate of 56, he developed SVT with heart rate of 160 shortly after. He was given IV metoprolol and his home PO atenolol, his SVT resolved. On further review, it was felt he had both SVT and paroxysmal atrial fibrillation. Outpatient echocardiogram was recommended. Given his CHA2DS2-VASC score of 3, it was felt he will ultimately require anticoagulation, however this was not pursued during the hospitalization due to recent trauma and a question of intramural hematoma in the aortic arch.  Patient was seen by his PCP yesterday as transition of care visit, he has been doing well.  He has not had a repeat CT of the chest. He has tried to contact cardiothoracic surgery service several times and was unable to schedule a follow-up. Otherwise, he has been doing quite well since discharge. He is still sore on the right side from the broken ribs. He also has a ecchymosis near the right flank and the right lower abdomen, however he does not have any significant pain in that area to suggest intra-abdominal bleeding. His blood pressure is mildly elevated yesterday, however is higher today. I will increase his losartan to 100 mg daily. He will continue on atenolol 50 mg twice a day. He is due for a CT of chest with contrast, I will order it for next week. He is also due for outpatient echocardiogram to assess for structural abnormalities.   Past Medical History:  Diagnosis Date  . Anxiety   . Colon polyp   . COLONIC POLYPS, ADENOMATOUS, HX OF 11/11/2003   Annotation: COLONOSCOPY 10/05 ([facility]) COLONOSCOPY 04/11/07 Qualifier: Diagnosis of  By: Carlean Purl MD, Dimas Millin Diverticulosis   . Gout   . Hx of adenomatous colonic polyps 01/12/2015  . Hyperlipidemia   . Hypertension     Past Surgical History:  Procedure Laterality Date  . COLONOSCOPY    . INGUINAL HERNIA REPAIR      Current Medications: Outpatient Medications Prior to Visit  Medication Sig Dispense Refill  . allopurinol (ZYLOPRIM) 100 MG tablet Take 100 mg by mouth daily.    Marland Kitchen  atenolol (TENORMIN) 50 MG tablet Take 1 tablet (50 mg total) by mouth 2 (two) times daily. 30 tablet 1  . ibuprofen (ADVIL,MOTRIN) 200 MG tablet Take 400 mg by mouth every 6 (six) hours as needed for moderate pain.    Marland Kitchen losartan (COZAAR) 50 MG tablet Take 1 tablet (50 mg total) by mouth daily. 30 tablet 1   No facility-administered medications prior to visit.      Allergies:   Patient has no known allergies.   Social History   Social History  . Marital status: Married    Spouse name: N/A  . Number of children: N/A  . Years of  education: N/A   Social History Main Topics  . Smoking status: Former Smoker    Quit date: 07/22/1970  . Smokeless tobacco: Never Used     Comment: 50 years ago  . Alcohol use 4.2 oz/week    7 Shots of liquor per week  . Drug use: No  . Sexual activity: Not Asked   Other Topics Concern  . None   Social History Narrative   Married     Family History:  The patient's family history includes Cancer in his mother.   ROS:   Please see the history of present illness.    ROS All other systems reviewed and are negative.   PHYSICAL EXAM:   VS:  BP (!) 149/73   Pulse 63   Ht 5\' 10"  (1.778 m)   Wt 188 lb 3.2 oz (85.4 kg)   BMI 27.00 kg/m    GEN: Well nourished, well developed, in no acute distress  HEENT: normal  Neck: no JVD, carotid bruits, or masses Cardiac: RRR; no murmurs, rubs, or gallops,no edema  Respiratory:  clear to auscultation bilaterally, normal work of breathing GI: soft, nontender, nondistended, + BS MS: no deformity or atrophy  Skin: warm and dry, no rash Neuro:  Alert and Oriented x 3, Strength and sensation are intact Psych: euthymic mood, full affect  Wt Readings from Last 3 Encounters:  12/11/15 188 lb 3.2 oz (85.4 kg)  12/10/15 189 lb 6.4 oz (85.9 kg)  12/04/15 190 lb 14.7 oz (86.6 kg)      Studies/Labs Reviewed:   EKG:  EKG is not ordered today.   Recent Labs: 11/24/2015: TSH 1.14 11/30/2015: ALT 40 12/01/2015: Hemoglobin 15.5; Platelets 140 12/05/2015: BUN 17; Creatinine, Ser 0.97; Magnesium 1.8; Potassium 3.7; Sodium 140   Lipid Panel    Component Value Date/Time   CHOL 177 11/24/2015 0950   TRIG 142.0 11/24/2015 0950   HDL 49.90 11/24/2015 0950   CHOLHDL 4 11/24/2015 0950   VLDL 28.4 11/24/2015 0950   LDLCALC 98 11/24/2015 0950    Additional studies/ records that were reviewed today include:   CT of abdomen and pelvis & CT of chest 11/30/2015 IMPRESSION: CT chest: Fractures of the right ninth, tenth, eleventh, and twelfth ribs  with small right apical pneumothorax and moderate soft tissue air laterally on the right. There is a right pleural effusion with consolidation/ atelectasis in the right base. There is mild posterior left base atelectasis.  There is a small focus of decreased attenuation in the aortic arch, possibly a small intramural hematoma. No dissection or transsection seen. No mediastinal hematoma evident. No aneurysm.  There is a focal hiatal hernia.  No adenopathy.  CT abdomen and pelvis: Multiple liver cysts. Sizable cyst arising from left kidney. No traumatic appearing lesion identified in the abdomen or pelvis. Multiple colonic diverticula without diverticulitis.  No inflammatory focus. No bowel obstruction. Appendix appears normal. No abscess. No fractures evident in the bones of the abdomen and pelvis. Small left adrenal adenoma.  Critical Value/emergent results were called by telephone at the time of interpretation on 11/30/2015 at 1:42 pm to Dr. Merrily Pew , who verbally acknowledged these results.   ASSESSMENT:    1. PAF (paroxysmal atrial fibrillation) ([place])   2. Abnormal CT scan, chest   3. SVT (supraventricular tachycardia) ([place])   4. Essential hypertension      PLAN:  In order of problems listed above:  1. PAF  - This patients CHA2DS2-VASc Score and unadjusted Ischemic Stroke Rate (% per year) is equal to 3.2 % stroke rate/year from a score of 3  Above score calculated as 1 point each if present [CHF, HTN, DM, Vascular=MI/PAD/Aortic Plaque, Age if 65-74, or Male] Above score calculated as 2 points each if present [Age > 75, or Stroke/TIA/TE]  - Currently in sinus rhythm based on physical exam, heart rate regular in the 60s. He denies any chest discomfort or palpitation other than rib pain. We plan to add eliquis if his CT of chest shows no further abnormalities. I had a long discussion with the patient regarding benefit and risk of traditional Coumadin versus Xarelto  versus eliquis. I recommended we start him on eliquis if his CT of chest with contrast come back showing no further lesion in the aorta. Patient however is hesitant even if CT of chest is back abnormal, he wish to potentially start eliquis after another evaluation later, he understand the increased risk of stroke the longer we delay initiating systemic anticoagulation.   - will also obtain echocardiogram as outpatient to assess for structural abnormalities. I did not appreciate significant heart murmur on physical exam.  2. SVT: No recurrence since the hospital, some this likely related to the fact that his atenolol was held prior to the event due to bradycardia and also he was admitted after a trauma that resulted in multiple fractures and a small pneumothorax.   3. Abnormal CT scan of chest: Hazy area in the aorta opposite to the subclavian artery on CT of chest with contrast on 11/30/2015. CT surgery was consulted, there was some question whether or not this is consistent with intramural hematoma, CT surgery recommended a repeat image in one week. This has not been done since discharge. I will order a CT of chest with contrast.  4. HTN: Blood pressure still elevated today, I will increase losartan to 100 mg daily.     Medication Adjustments/Labs and Tests Ordered: Current medicines are reviewed at length with the patient today.  Concerns regarding medicines are outlined above.  Medication changes, Labs and Tests ordered today are listed in the Patient Instructions below. Patient Instructions  Medication Instructions:  INCREASE Losartan to 100mg  take 1 tablet by mouth once a day  Labwork: None  Testing/Procedures: Your physician has requested that you have an echocardiogram. Echocardiography is a painless test that uses sound waves to create images of your heart. It provides your doctor with information about the size and shape of your heart and how well your heart's chambers and valves are  working. This procedure takes approximately one hour. There are no restrictions for this procedure.  CT OF CHEST WITH CONTRAST  Follow-Up: Your physician recommends that you schedule a follow-up appointment in: [place].  Any Other Special Instructions Will Be Listed Below (If Applicable).     If you need  a refill on your cardiac medications before your next appointment, please call your pharmacy.     Hilbert Corrigan, [place]  12/11/2015 10:16 PM    [facility] [place], [place], [place]  [address] Phone: [phone]; Fax: [phone]

## 2015-12-11 NOTE — Patient Instructions (Addendum)
Medication Instructions:  INCREASE Losartan to 100mg  take 1 tablet by mouth once a day  Labwork: None  Testing/Procedures: Your physician has requested that you have an echocardiogram. Echocardiography is a painless test that uses sound waves to create images of your heart. It provides your doctor with information about the size and shape of your heart and how well your heart's chambers and valves are working. This procedure takes approximately one hour. There are no restrictions for this procedure.  CT OF CHEST WITH CONTRAST  Follow-Up: Your physician recommends that you schedule a follow-up appointment in: [place].  Any Other Special Instructions Will Be Listed Below (If Applicable).     If you need a refill on your cardiac medications before your next appointment, please call your pharmacy.

## 2015-12-14 ENCOUNTER — Telehealth: Payer: Self-pay | Admitting: *Deleted

## 2015-12-14 NOTE — Telephone Encounter (Signed)
Spoke with patient regarding CT of chest---scheduled 12/16/15 @ 1:30 pm @[facility]---arrive at 1:15 in the radiology department---NPO 4 hour prior.  Patient voiced his understanding.

## 2015-12-15 ENCOUNTER — Telehealth (HOSPITAL_COMMUNITY): Payer: Self-pay

## 2015-12-15 NOTE — Telephone Encounter (Signed)
Left message stating he could just f/u with PCP and only needs to see trauma service prn.

## 2015-12-16 ENCOUNTER — Other Ambulatory Visit: Payer: Self-pay | Admitting: Physician Assistant

## 2015-12-16 ENCOUNTER — Ambulatory Visit (HOSPITAL_COMMUNITY)
Admission: RE | Admit: 2015-12-16 | Discharge: 2015-12-16 | Disposition: A | Discharge status: Home / Self Care | Payer: 59 | Source: Ambulatory Visit | Attending: Physician Assistant | Admitting: Physician Assistant

## 2015-12-16 ENCOUNTER — Telehealth: Payer: Self-pay | Admitting: Family Medicine

## 2015-12-16 DIAGNOSIS — I700 Atherosclerosis of aorta: Secondary | ICD-10-CM | POA: Insufficient documentation

## 2015-12-16 DIAGNOSIS — R9389 Abnormal findings on diagnostic imaging of other specified body structures: Secondary | ICD-10-CM

## 2015-12-16 DIAGNOSIS — I7411 Embolism and thrombosis of thoracic aorta: Secondary | ICD-10-CM | POA: Diagnosis not present

## 2015-12-16 DIAGNOSIS — X58XXXA Exposure to other specified factors, initial encounter: Secondary | ICD-10-CM | POA: Insufficient documentation

## 2015-12-16 DIAGNOSIS — K769 Liver disease, unspecified: Secondary | ICD-10-CM | POA: Insufficient documentation

## 2015-12-16 DIAGNOSIS — S2241XA Multiple fractures of ribs, right side, initial encounter for closed fracture: Secondary | ICD-10-CM | POA: Insufficient documentation

## 2015-12-16 DIAGNOSIS — R938 Abnormal findings on diagnostic imaging of other specified body structures: Secondary | ICD-10-CM | POA: Diagnosis present

## 2015-12-16 MED ORDER — IOPAMIDOL (ISOVUE-370) INJECTION 76%
INTRAVENOUS | Status: AC
  Filled 2015-12-16: qty 100

## 2015-12-16 MED ORDER — IOPAMIDOL (ISOVUE-370) INJECTION 76%
100.0000 mL | Freq: Once | INTRAVENOUS | Status: AC | PRN
  Administered 2015-12-16: 100 mL via INTRAVENOUS

## 2015-12-16 NOTE — Telephone Encounter (Signed)
Pt had cat scan today.   Pt sees Dr Manson Allan at Dr Jacalyn Lefevre office. Pt is not very happy with Dr Manson Allan and hopes to get another cardiologist assigned to him. Pt would like Dr hunter to call him and discuss a few things if he will.

## 2015-12-17 NOTE — Telephone Encounter (Signed)
Called and left a voicemail message asking for a return phone call 

## 2015-12-17 NOTE — Telephone Encounter (Signed)
Pt returning your call. Pt is available now.

## 2015-12-17 NOTE — Telephone Encounter (Signed)
He saw the PA. That is not his regularly assigned cardiologist. He can simply request to see another provider.   Outside of that- can you encourage him to sign up for mychart and send me a message with his concerns so I can review them before calling him back? Sometimes I am able to simply answer by mychart as well.   If he doesn't want to do that- tell him I will try to call after 5 pm today

## 2015-12-18 NOTE — Telephone Encounter (Signed)
Spoke with patient who I scheduled to come in next week for a nurse visit to compare his new home BP cuff to our manual reading.  He also expressed several complaints regarding his Cat Scan/Cardiology appointment. I encouraged him to express these concerns to his cardiologist office as they should have the opportunity to address his concerns (bad breath, follow up, communication). He agreed and verbalized understanding.

## 2015-12-21 ENCOUNTER — Ambulatory Visit (INDEPENDENT_AMBULATORY_CARE_PROVIDER_SITE_OTHER): Payer: 59 | Admitting: Family Medicine

## 2015-12-21 ENCOUNTER — Telehealth: Payer: Self-pay | Admitting: Physician Assistant

## 2015-12-21 ENCOUNTER — Encounter: Payer: Self-pay | Admitting: Family Medicine

## 2015-12-21 ENCOUNTER — Ambulatory Visit (INDEPENDENT_AMBULATORY_CARE_PROVIDER_SITE_OTHER): Payer: 59

## 2015-12-21 DIAGNOSIS — I7410 Embolism and thrombosis of unspecified parts of aorta: Secondary | ICD-10-CM

## 2015-12-21 DIAGNOSIS — I10 Essential (primary) hypertension: Secondary | ICD-10-CM | POA: Diagnosis not present

## 2015-12-21 LAB — BASIC METABOLIC PANEL
BUN: 18 mg/dL (ref 6–23)
CHLORIDE: 106 meq/L (ref 96–112)
CO2: 30 mEq/L (ref 19–32)
Calcium: 9.1 mg/dL (ref 8.4–10.5)
Creatinine, Ser: 0.94 mg/dL (ref 0.40–1.50)
GFR: 82.51 mL/min (ref >60.00)
Glucose, Bld: 104 mg/dL — ABNORMAL HIGH (ref 70–99)
POTASSIUM: 4.7 meq/L (ref 3.5–5.1)
SODIUM: 142 meq/L (ref 135–145)

## 2015-12-21 NOTE — Progress Notes (Signed)
Pre visit review using our clinic review tool, if applicable. No additional management support is needed unless otherwise documented below in the visit note. 

## 2015-12-21 NOTE — Progress Notes (Signed)
error 

## 2015-12-21 NOTE — Telephone Encounter (Signed)
I called patient and made him aware of advisements based on CT results. He's aware I will enter referral order for TCTS and he should anticipated a call this week to schedule appt. He voiced thanks and understanding.

## 2015-12-21 NOTE — Assessment & Plan Note (Addendum)
S: controlled last visit here with losartan 10mg  with atenolol 100mg  once a day. Since then had a visit with cardiology with BP higher and today brought in home cuff to verify. His home #s are in 150-180 range with occasional in 130s or 140s.  BP Readings from Last 3 Encounters:  12/21/15 (!) 162/94 home cuff. Our cuff 160/84.   12/11/15 (!) 149/73  12/10/15 136/82  A/P:Continue current meds:  Get updated bmet- if no increase in creatinine then would start hctz Addendum: no significant creatinine increase- we will add HCTZ 25mg  and follow up 2-3 weeks. Roselyn Reef will call in.

## 2015-12-21 NOTE — Telephone Encounter (Signed)
LMTCB Per result note: Patient will need to schedule office visit with cardiothoracic surgery, Dr. Vivi Martens office to review recent thoracic aortic hematoma to see if patient can take eliquis.

## 2015-12-21 NOTE — Telephone Encounter (Signed)
New message ° ° ° ° °Returning a call to the nurse °

## 2015-12-21 NOTE — Patient Instructions (Signed)
Check kidney function before you leave  If kidney function has worsened- may have to stop losartan. If has not changed more than bout 25%- then likely add hydrochlorothiazide and recheck next week.   Call Dr. Cyndia Bent to get follow up appointment

## 2015-12-21 NOTE — Telephone Encounter (Signed)
Follow Up ° °Pt returning call from earlier. Please call. °

## 2015-12-21 NOTE — Progress Notes (Signed)
Subjective:  Travis Pruitt is a 80 y.o. year old very pleasant male patient who presents for/with See problem oriented charting ROS- No chest pain or shortness of breath. No headache or blurry vision.see any ROS included in HPI as well.   Past Medical History-  Patient Active Problem List   Diagnosis Date Noted  . PAF (paroxysmal atrial fibrillation) ([place]) 12/10/2015    Priority: High  . Paroxysmal supraventricular tachycardia ([place]) 12/03/2015    Priority: High  . Multiple rib fractures 11/30/2015    Priority: Medium  . Generalized anxiety disorder 11/27/2007    Priority: Medium  . Hyperlipidemia 10/25/2006    Priority: Medium  . Gout 10/25/2006    Priority: Medium  . Essential hypertension 10/25/2006    Priority: Medium  . Hx of adenomatous colonic polyps 01/12/2015    Priority: Low    Medications- reviewed and updated Current Outpatient Prescriptions  Medication Sig Dispense Refill  . allopurinol (ZYLOPRIM) 100 MG tablet Take 100 mg by mouth daily.    Marland Kitchen atenolol (TENORMIN) 50 MG tablet Take 1 tablet (50 mg total) by mouth 2 (two) times daily. 30 tablet 1  . ibuprofen (ADVIL,MOTRIN) 200 MG tablet Take 400 mg by mouth every 6 (six) hours as needed for moderate pain.    Marland Kitchen losartan (COZAAR) 100 MG tablet Take 1 tablet (100 mg total) by mouth daily. 30 tablet 3   No current facility-administered medications for this visit.     Objective: BP 160/84. HR 55. Temp 98.5.  Gen: NAD, resting comfortably CV: RRR no murmurs rubs or gallops Lungs: CTAB no crackles, wheeze, rhonchi Ext: no edema  Assessment/Plan:  Essential hypertension S: controlled last visit here with losartan 10mg  with atenolol 100mg  once a day. Since then had a visit with cardiology with BP higher and today brought in home cuff to verify. His home #s are in 150-180 range with occasional in 130s or 140s.  BP Readings from Last 3 Encounters:  12/21/15 (!) 162/94 home cuff. Our cuff 160/84.   12/11/15 (!)  149/73  12/10/15 136/82  A/P:Continue current meds:  Get updated bmet- if no increase in creatinine then would start hctz Addendum: no significant creatinine increase- we will add HCTZ 25mg  and follow up 2-3 weeks. Roselyn Reef will call in.   2-3 week follow up  Orders Placed This Encounter  Procedures  . Basic metabolic panel    [place]   Return precautions advised.  Garret Reddish, MD

## 2015-12-22 ENCOUNTER — Other Ambulatory Visit: Payer: Self-pay

## 2015-12-22 MED ORDER — HYDROCHLOROTHIAZIDE 25 MG PO TABS: 25.0000 mg | ORAL_TABLET | Freq: Every day | ORAL | 3 refills | Status: DC

## 2015-12-23 ENCOUNTER — Encounter: Payer: Self-pay | Admitting: *Deleted

## 2015-12-23 NOTE — Progress Notes (Signed)
Dr. Cyndia Bent had consulted on Travis Pruitt while he was in the hospital for rib fractures after a fall. A ?mural thrombus in the aortic arch was seen on the trauma CT. Dr. Cyndia Bent recommended a repeat CTA CHEST in one week to re-evaluate this finding. On discharge this was assigned to his PCP. His cardiologist/P.A. actually scheduled it and referred him back to see Dr. Cyndia Bent. In the meantime, Travis Pruitt called the office very confused regarding what were the next steps...when was he going to be prescribed a blood thinner, etc. I explained that I would have Dr. Cyndia Bent review the results to see if there was in fact any need for him to be seen at all. Dr. Cyndia Bent reviewed the scan and said it was o.k. and he didn't need to see him unless he just wanted to. I relayed this message to Travis Pruitt and he was quite satisfied with this resolution. The referral appointment was cancelled.

## 2015-12-28 ENCOUNTER — Telehealth: Payer: Self-pay | Admitting: Pharmacist

## 2015-12-29 ENCOUNTER — Encounter: Payer: 59 | Admitting: Surgery

## 2015-12-30 NOTE — Telephone Encounter (Signed)
Pt hung up phone.

## 2015-12-30 NOTE — Discharge Summary (Signed)
Physician Discharge Summary  Patient ID: Travis Pruitt MRN: [id number] DOB/AGE: [birth year] 80 y.o.  Admit date: 11/30/2015 Discharge date: 12/05/2015  Discharge Diagnoses Patient Active Problem List   Diagnosis Date Noted  . PAF (paroxysmal atrial fibrillation) ([place]) 12/10/2015  . Paroxysmal supraventricular tachycardia ([place]) 12/03/2015  . Multiple rib fractures 11/30/2015  . Hx of adenomatous colonic polyps 01/12/2015  . Generalized anxiety disorder 11/27/2007  . Hyperlipidemia 10/25/2006  . Gout 10/25/2006  . Essential hypertension 10/25/2006    Consultants Dr. Gilford Raid for cardiothoracic surgery  Dr. Kirk Ruths for cardiology   Procedures None   HPI: [name] presented to the [facility] complaining of right-sided chest and abdominal pain. He fell about 15 feet off a ladder 2 days prior and landed on his right side. He did not think that he hit his head or had any loss of consciousness, but he was not sure. He was seen in urgent care over the weekend and found to have multiple right rib fractures. He was given Percocet to take but this did not help his pain. His pain got worse therefore he came to the ED for evaluation. He underwent CT scanning of his chest that showed the rib fractures and a small pneumothorax in addition to a possible aortic arch hematoma. He was admitted to the trauma service and cardiothoracic surgery was consulted.   Hospital Course: Cardiothoracic surgery recommended blood pressure control and outpatient follow-up. He was mobilized with physical and occupational therapies and did well. He developed a supraventricular tachycardia and cardiology was consulted. He was also found to have paroxysmal atrial fibrillation. Medication adjustments were made. Once the patient's pain was brought under good control he was discharged home in good condition.     Medication List    TAKE these medications   allopurinol 100 MG tablet Commonly known as:   ZYLOPRIM Take 100 mg by mouth daily.   atenolol 50 MG tablet Commonly known as:  TENORMIN Take 1 tablet (50 mg total) by mouth 2 (two) times daily.   ibuprofen 200 MG tablet Commonly known as:  ADVIL,MOTRIN Take 400 mg by mouth every 6 (six) hours as needed for moderate pain.        Follow-up Information    [facility] Follow up in 2 week(s).   Contact information: [place] [phone] [place], [place] Follow up in 1 week(s).   Specialty:  Cardiology Contact information: [address] [place] [address] [phone]            Signed: Lisette Abu, PA-C Pager: [phone] General Trauma PA Pager: [phone] 12/30/2015, 1:14 PM

## 2015-12-31 ENCOUNTER — Other Ambulatory Visit: Payer: Self-pay | Admitting: Family Medicine

## 2016-01-05 ENCOUNTER — Ambulatory Visit (HOSPITAL_COMMUNITY): Payer: 59 | Attending: Cardiology

## 2016-01-05 ENCOUNTER — Other Ambulatory Visit: Payer: Self-pay

## 2016-01-05 DIAGNOSIS — I480 Paroxysmal atrial fibrillation: Secondary | ICD-10-CM | POA: Insufficient documentation

## 2016-01-05 DIAGNOSIS — I253 Aneurysm of heart: Secondary | ICD-10-CM | POA: Diagnosis not present

## 2016-01-05 DIAGNOSIS — I471 Supraventricular tachycardia: Secondary | ICD-10-CM | POA: Diagnosis not present

## 2016-01-05 DIAGNOSIS — E785 Hyperlipidemia, unspecified: Secondary | ICD-10-CM | POA: Diagnosis not present

## 2016-01-05 DIAGNOSIS — F419 Anxiety disorder, unspecified: Secondary | ICD-10-CM | POA: Diagnosis not present

## 2016-01-05 DIAGNOSIS — I10 Essential (primary) hypertension: Secondary | ICD-10-CM | POA: Diagnosis not present

## 2016-01-06 ENCOUNTER — Ambulatory Visit (INDEPENDENT_AMBULATORY_CARE_PROVIDER_SITE_OTHER): Payer: 59 | Admitting: Family Medicine

## 2016-01-06 ENCOUNTER — Telehealth: Payer: Self-pay | Admitting: Family Medicine

## 2016-01-06 ENCOUNTER — Encounter: Payer: Self-pay | Admitting: Family Medicine

## 2016-01-06 ENCOUNTER — Telehealth: Payer: Self-pay

## 2016-01-06 VITALS — BP 156/78 | HR 51 | Temp 98.4°F | Ht 70.0 in | Wt 183.4 lb

## 2016-01-06 DIAGNOSIS — I10 Essential (primary) hypertension: Secondary | ICD-10-CM | POA: Diagnosis not present

## 2016-01-06 DIAGNOSIS — I480 Paroxysmal atrial fibrillation: Secondary | ICD-10-CM | POA: Diagnosis not present

## 2016-01-06 DIAGNOSIS — R55 Syncope and collapse: Secondary | ICD-10-CM

## 2016-01-06 LAB — COMPREHENSIVE METABOLIC PANEL
ALK PHOS: 73 U/L (ref 39–117)
ALT: 12 U/L (ref 0–53)
AST: 15 U/L (ref 0–37)
Albumin: 4.1 g/dL (ref 3.5–5.2)
BILIRUBIN TOTAL: 0.9 mg/dL (ref 0.2–1.2)
BUN: 19 mg/dL (ref 6–23)
CO2: 31 meq/L (ref 19–32)
CREATININE: 1.04 mg/dL (ref 0.40–1.50)
Calcium: 9.7 mg/dL (ref 8.4–10.5)
Chloride: 100 mEq/L (ref 96–112)
GFR: 73.41 mL/min (ref >60.00)
GLUCOSE: 109 mg/dL — AB (ref 70–99)
Potassium: 4.6 mEq/L (ref 3.5–5.1)
Sodium: 139 mEq/L (ref 135–145)
TOTAL PROTEIN: 6.7 g/dL (ref 6.0–8.3)

## 2016-01-06 LAB — CBC WITH DIFFERENTIAL/PLATELET
BASOS ABS: 0 10*3/uL (ref 0.0–0.1)
Basophils Relative: 0.3 % (ref 0.0–3.0)
EOS ABS: 0.1 10*3/uL (ref 0.0–0.7)
Eosinophils Relative: 1 % (ref 0.0–5.0)
HEMATOCRIT: 50.4 % (ref 39.0–52.0)
Hemoglobin: 17.3 g/dL — ABNORMAL HIGH (ref 13.0–17.0)
LYMPHS ABS: 2.1 10*3/uL (ref 0.7–4.0)
LYMPHS PCT: 25.7 % (ref 12.0–46.0)
MCHC: 34.3 g/dL (ref 30.0–36.0)
MCV: 93.5 fl (ref 78.0–100.0)
MONOS PCT: 9 % (ref 3.0–12.0)
Monocytes Absolute: 0.7 10*3/uL (ref 0.1–1.0)
NEUTROS PCT: 64 % (ref 43.0–77.0)
Neutro Abs: 5.3 10*3/uL (ref 1.4–7.7)
PLATELETS: 207 10*3/uL (ref 150.0–400.0)
RBC: 5.39 Mil/uL (ref 4.22–5.81)
RDW: 13.5 % (ref 11.5–15.5)
WBC: 8.3 10*3/uL (ref 4.0–10.5)

## 2016-01-06 LAB — TROPONIN I: TNIDX: 0.01 ug/L (ref 0.00–0.06)

## 2016-01-06 MED ORDER — AMLODIPINE BESYLATE 2.5 MG PO TABS: 2.5000 mg | ORAL_TABLET | Freq: Every day | ORAL | 3 refills | Status: DC

## 2016-01-06 NOTE — Telephone Encounter (Signed)
Called cardiology and got patient an appointment for 08:00 in the morning. Patient is aware.

## 2016-01-06 NOTE — Patient Instructions (Addendum)
Cut atenolol in half to 25 mg half a day- slow heart rate could have caused this episode. I will add a low dose amlodipine 2.5mg  to try to help with blood pressure. Blood pressure did not drop with position change today  Will have Jamie call to see if she can get you into cardiology sooner  Check bloodwork today- doubt heart attack (suspect more of rhythm issue) but will rule out heart attack with blood test.   Get CT head to evaluate for stroke as cause (MRI is better but will take much longer)  Need to not drive for 6 months given unclear cause of passing out.

## 2016-01-06 NOTE — Assessment & Plan Note (Signed)
Poorly controlled on Atenolol 50mg  BID, losartan 100mg , hctz 25 mg. Not orthostatic on exam today. With bradycardia- reduce atenolol to 25mg  BID- may need to reduce further. Roselyn Reef my nurse was to try to get patient an appointment with cardiology tomorrow. Will add amlodipine 2.5mg  as well to help get BP controlled- especially with concern of intramural aortic thrombus

## 2016-01-06 NOTE — Progress Notes (Signed)
Pre visit review using our clinic review tool, if applicable. No additional management support is needed unless otherwise documented below in the visit note. 

## 2016-01-06 NOTE — Telephone Encounter (Signed)
Spoke with pt and scheduled him for 11:30am with Dr Yong Channel. Pt aware. Nothing further needed.

## 2016-01-06 NOTE — Progress Notes (Addendum)
Subjective:  Travis Pruitt is a 80 y.o. year old very pleasant male patient who presents after syncopal episode last night.   Got up to go to bathroom around midnight- walking from bathroom to bed and the next thing he knows he ended up on the floor. Did admit to sitting on toilet-wonders if got up to quick. Did not really feel prodromal symptoms.  Not sure how long he was out but less than a minute. Did not hit head. Wife heard him fall and got up. No headaches or blurry vision. No history of seizures. Lack of appetite over last few weeks. Has also lost 4 lbs in a few weeks. He states checked his SBP and as low as 116 at the time. He wonders if BP related- recently changed pill regime- increased losartan to 100mg  recently, and doing atenolol 50mg  BID (was shopped short term in hospital and ended up going into atrial fibrillation. Started a few weeks ago on hctz 25mg   Also of note patient with concern for small intramural hematoma after fall from 15 foot ladder a few weeks ago. Repeat CT about 2 weeks ago which showed thrombus along wall of transverse aorta and thought atherosclerotic. Question has been if patient can start eliquis given atrial fibrillation in hospital but he has not been called about referral that was placed for cardiothoracic surgery- appears to still be pending.   Patient had an echocardiogram done yesterday and read is rather reassuring with no obvious structural disease- normal EF, grade I diastolic dysfunction, mild LVH, trace aortic insufficiency.   Also of note, BP remains high today despite prior addition of hctz but is improving.  BP Readings from Last 3 Encounters:  01/06/16 (!) 156/78  12/21/15 (!) 162/94  12/11/15 (!) 149/73   [facility] advised ER visit but patient declined and opted to come to our office for evaluation.   ROS-No chest pain or shortness of breath. No facial or extremity weakness. No slurred words or trouble swallowing. no blurry vision or double  vision. No paresthesias. No confusion or word finding difficulties.  Review of Systems  Constitutional: Positive for weight loss. Negative for chills, diaphoresis and fever.  HENT: Negative for ear discharge, ear pain and nosebleeds.   Eyes: Negative for blurred vision, double vision and pain.  Respiratory: Negative for cough and hemoptysis.   Cardiovascular: Negative for chest pain, palpitations, orthopnea and leg swelling.  Gastrointestinal: Negative for heartburn and nausea.  Genitourinary: Negative for dysuria and urgency.  Musculoskeletal: Negative for myalgias and neck pain.       Mild rib pain  Skin: Negative for itching and rash.  Neurological: Positive for loss of consciousness. Negative for seizures and weakness.  Endo/Heme/Allergies: Negative for polydipsia. Does not bruise/bleed easily.  Psychiatric/Behavioral: Negative for hallucinations and substance abuse. The patient is nervous/anxious.    Past Medical History-  Patient Active Problem List   Diagnosis Date Noted  . Syncope 01/06/2016    Priority: High  . PAF (paroxysmal atrial fibrillation) ([place]) 12/10/2015    Priority: High  . Paroxysmal supraventricular tachycardia ([place]) 12/03/2015    Priority: High  . Multiple rib fractures 11/30/2015    Priority: Medium  . Generalized anxiety disorder 11/27/2007    Priority: Medium  . Hyperlipidemia 10/25/2006    Priority: Medium  . Gout 10/25/2006    Priority: Medium  . Essential hypertension 10/25/2006    Priority: Medium  . Hx of adenomatous colonic polyps 01/12/2015    Priority: Low   Family History  Problem Relation Age of Onset  . Cancer Mother     lung  . Colon cancer Neg Hx    Social History   Social History Narrative   Married  former smoker-quit 1972  Medications- reviewed and updated Current Outpatient Prescriptions  Medication Sig Dispense Refill  . allopurinol (ZYLOPRIM) 100 MG tablet TAKE 1 TABLET (100 MG TOTAL) BY MOUTH DAILY. 90 tablet 1  .  atenolol (TENORMIN) 50 MG tablet Take 1 tablet (50 mg total) by mouth 2 (two) times daily. 30 tablet 1  . hydrochlorothiazide (HYDRODIURIL) 25 MG tablet Take 1 tablet (25 mg total) by mouth daily. 90 tablet 3  . ibuprofen (ADVIL,MOTRIN) 200 MG tablet Take 400 mg by mouth every 6 (six) hours as needed for moderate pain.    Marland Kitchen losartan (COZAAR) 100 MG tablet Take 1 tablet (100 mg total) by mouth daily. 30 tablet 3   No current facility-administered medications for this visit.     Objective: BP (!) 156/78 (BP Location: Left Arm, Patient Position: Sitting, Cuff Size: Large)   Pulse (!) 51   Temp 98.4 F (36.9 C) (Oral)   Ht 5\' 10"  (1.778 m)   Wt 183 lb 6.4 oz (83.2 kg)   SpO2 93%   BMI 26.32 kg/m  Gen: NAD, resting comfortably Oropharynx normal. Mucous membranes are moist. CV: RRR no murmurs rubs or gallops Lungs: CTAB no crackles, wheeze, rhonchi Abdomen: soft/nontender/nondistended/normal bowel sounds. No rebound or guarding.  Ext: no edema Skin: warm, dry, no rash Neuro: CN II-XII intact, sensation and reflexes normal throughout, 5/5 muscle strength in bilateral upper and lower extremities. Normal finger to nose. Normal rapid alternating movements. No pronator drift. Normal romberg. Normal gait.   EKG: fair amount of movement, regular rate, rate 50, no clear p waves, sinus bradycardia vs atrial fibrillation suspect sinus bradycardia, normal intervals, no obvious hypertrophy, no st or t wave changes.   Assessment/Plan:  PAF (paroxysmal atrial fibrillation) ([facility]) Rate is regular but no clear p waves on EKG (fair amount of movement). Bradycardic though- reducing atenolol. Would like to start eliquis but awaiting approval from cardiothoracic surgery. With repeat CT- hopeful cardiology may be able to comment and that we may be able to start patient sooner. Due to a fib history and syncope- will get CT to evaluate for CVA but no clear neurological deficits toay.   Essential  hypertension Poorly controlled on Atenolol 50mg  BID, losartan 100mg , hctz 25 mg. Not orthostatic on exam today. With bradycardia- reduce atenolol to 25mg  BID- may need to reduce further. Roselyn Reef my nurse was to try to get patient an appointment with cardiology tomorrow. Will add amlodipine 2.5mg  as well to help get BP controlled- especially with concern of intramural aortic thrombus  Syncope 80 year old male with a fib not on anticoagulation, hypertension, recent fall off 15 foot ladder and rib factures, hyperlipidemia presents after syncopal episode about 12 hours ago. Asymptomatic before and after.  - EKG with bradycardia (? A fib but regular rate)- will reduce atenolol. Leading theory at present is bradycardia related episode. May need cardiac monitoring.  - no prodromal symptoms and not orthostatic today- do not think vasovagal, situational. Do not think high probability orthostatic- appears hydrated -given atrial fibrillation, get CT to ensure no CVA. Prefer MRI but would take much longer to get this.  - doubt MI, but get troponin today to rule out. May need stress testing - update cbc, cmp - get close cardiology follow up -already had echocardiogram- no  clear structural causes of episode - no driving for 6 months discussed at present given unclear etiology   Follow up determined by labs and workup  Orders Placed This Encounter  Procedures  . CT Head W Contrast    Standing Status:   Future    Standing Expiration Date:   04/05/2017    Order Specific Question:   If indicated for the ordered procedure, I authorize the administration of contrast media per Radiology protocol    Answer:   Yes    Order Specific Question:   Reason for Exam (SYMPTOM  OR DIAGNOSIS REQUIRED)    Answer:   syncope. a fib- evaluate stroke since MR could not get quickly.    Order Specific Question:   Preferred imaging location?    Answer:   [facility]  . Troponin I  . CBC with Differential/Platelet  .  Comprehensive metabolic panel    [place]  . EKG 12-Lead    Order Specific Question:   Where should this test be performed    Answer:   Other    Meds ordered this encounter  Medications  . amLODipine (NORVASC) 2.5 MG tablet    Sig: Take 1 tablet (2.5 mg total) by mouth daily.    Dispense:  30 tablet    Refill:  3    Return precautions advised.  Garret Reddish, MD

## 2016-01-06 NOTE — Telephone Encounter (Signed)
Team health nurse called and stated patient passed out last night, isn't sure how long he was out.  Did not call 911.  Happened about midnight when he got up to go to the restroom.  Pt is refusing to go to urgent care or ER until hearing what Dr. Yong Channel wants him to do.

## 2016-01-06 NOTE — Progress Notes (Signed)
Cardiology Office Note    Date:  01/07/2016   ID:  Travis Pruitt, DOB [birth year], MRN [id number]  PCP:  Travis Reddish, MD  Cardiologist: Dr. Stanford Breed   Chief Complaint  Patient presents with  . Follow-up    History of Present Illness:    Travis Pruitt is a 80 y.o. male with past medical history of PAF, baseline bradycardia, HTN and a questionable intramural aortic arch hematoma who presents to the office today following a syncopal event.  Was initially admitted in early November following a 15 ft fall off a ladder due to the ladder collapsing. Suffered multiple rib fractures and continued to have pain, therefore was admitted for further evaluation. CT Imaging on admission showed a small focus of decreased attenuation in the aortic arch, possibly a small intramural hematoma with no evidence of  dissection or transsection noted. CT surgery recommended a repeat CT in one week. Also had episodes of PAFand SVT during the admission but was not started on anticoagulation at that time due to concern for a intramural hematoma.   A repeat CT Scan was obtained on 11/15 which showed a "tiny area of thrombus along the wall of the transverse aorta likely atherosclerotic in origin". A referral had been sent for him to see CT Surgery as an outpatient. In reviewing notes from 11/22 from Dr. Vivi Martens office this reads "Dr. Cyndia Bent reviewed the scan and said it was o.k. and he didn't need to see him unless he just wanted to. I relayed this message to Travis Pruitt and he was quite satisfied with this resolution. The referral appointment was cancelled."  Today, he reports having a syncopal event two nights ago. Had consumed 6-8 oz of Scotch and went to bed without any symptoms. Awoke around midnight to use the restroom and after using the restroom and walking down the hallway, he passed out. Denies any prodromal symptoms. Believes he lost consciousness for only a few seconds. Denies hitting his  head. His wife heard him fall and came to assist him. He "felt fine" after the incident and went back to bed.   Was seen by his PCP yesterday and was noted to be bradycardiac with a HR of 50. Orthostatic vitals were checked at that time and without significance. It was thought his episode may be related to bradycardia, therefore Atenolol was reduced from 50mg  BID to 25mg  BID and he was started on Amlodipine 2.5mg  daily for BP control. CT Head was ordered but not yet obtained.   The patient reports feeling well today. Denies any repeat syncopal events. No chest discomfort or palpitations. Breathing at baseline.    Past Medical History:  Diagnosis Date  . Anxiety   . Colon polyp   . COLONIC POLYPS, ADENOMATOUS, HX OF 11/11/2003   Annotation: COLONOSCOPY 10/05 ([facility]) COLONOSCOPY 04/11/07 Qualifier: Diagnosis of  By: Carlean Purl MD, Dimas Millin Diverticulosis   . Gout   . Hx of adenomatous colonic polyps 01/12/2015  . Hyperlipidemia   . Hypertension   . PAF (paroxysmal atrial fibrillation) ([place])    a. diagnosed in 12/2015 b. started on Eliquis for anticoagulation    Past Surgical History:  Procedure Laterality Date  . COLONOSCOPY    . INGUINAL HERNIA REPAIR      Current Medications: Outpatient Medications Prior to Visit  Medication Sig Dispense Refill  . allopurinol (ZYLOPRIM) 100 MG tablet TAKE 1 TABLET (100 MG TOTAL) BY MOUTH DAILY. 90 tablet 1  . amLODipine (NORVASC)  2.5 MG tablet Take 1 tablet (2.5 mg total) by mouth daily. 30 tablet 3  . hydrochlorothiazide (HYDRODIURIL) 25 MG tablet Take 1 tablet (25 mg total) by mouth daily. 90 tablet 3  . ibuprofen (ADVIL,MOTRIN) 200 MG tablet Take 400 mg by mouth every 6 (six) hours as needed for moderate pain.    Marland Kitchen losartan (COZAAR) 100 MG tablet Take 1 tablet (100 mg total) by mouth daily. 30 tablet 3  . atenolol (TENORMIN) 50 MG tablet Take 1 tablet (50 mg total) by mouth 2 (two) times daily. 30 tablet 1   No facility-administered  medications prior to visit.      Allergies:   Patient has no known allergies.   Social History   Social History  . Marital status: Married    Spouse name: N/A  . Number of children: N/A  . Years of education: N/A   Social History Main Topics  . Smoking status: Former Smoker    Quit date: 07/22/1970  . Smokeless tobacco: Never Used     Comment: 50 years ago  . Alcohol use 4.2 oz/week    7 Shots of liquor per week  . Drug use: No  . Sexual activity: Not Asked   Other Topics Concern  . None   Social History Narrative   Married     Family History:  The patient's family history includes Cancer in his mother.   Review of Systems:   Please see the history of present illness.     General:  No chills, fever, night sweats or weight changes.  Cardiovascular:  No chest pain, dyspnea on exertion, edema, orthopnea, palpitations, paroxysmal nocturnal dyspnea. Dermatological: No rash, lesions/masses Respiratory: No cough, dyspnea Urologic: No hematuria, dysuria Abdominal:   No nausea, vomiting, diarrhea, bright red blood per rectum, melena, or hematemesis Neurologic:  No visual changes, wkns, changes in mental status. Positive for syncopal event.  All other systems reviewed and are otherwise negative except as noted above.   Physical Exam:    VS:  BP 130/90   Pulse (!) 52   Ht 5\' 10"  (1.778 m)   Wt 182 lb (82.6 kg)   BMI 26.11 kg/m    General: Well developed, well nourished,male appearing in no acute distress. Head: Normocephalic, atraumatic, sclera non-icteric, no xanthomas, nares are without discharge.  Neck: No carotid bruits. JVD not elevated.  Lungs: Respirations regular and unlabored, without wheezes or rales.  Heart: Regular rhythm, bradycardiac rate. No S3 or S4.  No murmur, no rubs, or gallops appreciated. Abdomen: Soft, non-tender, non-distended with normoactive bowel sounds. No hepatomegaly. No rebound/guarding. No obvious abdominal masses. Msk:  Strength and tone  appear normal for age. No joint deformities or effusions. Extremities: No clubbing or cyanosis. No edema.  Distal pedal pulses are 2+ bilaterally. Neuro: Alert and oriented X 3. Moves all extremities spontaneously. No focal deficits noted. Psych:  Responds to questions appropriately with a normal affect. Skin: No rashes or lesions noted  Wt Readings from Last 3 Encounters:  01/07/16 182 lb (82.6 kg)  01/06/16 183 lb 6.4 oz (83.2 kg)  12/21/15 187 lb 3.2 oz (84.9 kg)     Studies/Labs Reviewed:   EKG:  EKG is ordered today. The EKG ordered today demonstrates sinus bradycardia with 1st degree AV block, HR 52, with no acute ST or T-wave changes.   Recent Labs: 11/24/2015: TSH 1.14 12/05/2015: Magnesium 1.8 01/06/2016: ALT 12; BUN 19; Creatinine, Ser 1.04; Hemoglobin 17.3; Platelets 207.0; Potassium 4.6; Sodium 139  Lipid Panel    Component Value Date/Time   CHOL 177 11/24/2015 0950   TRIG 142.0 11/24/2015 0950   HDL 49.90 11/24/2015 0950   CHOLHDL 4 11/24/2015 0950   VLDL 28.4 11/24/2015 0950   LDLCALC 98 11/24/2015 0950    Additional studies/ records that were reviewed today include:   CTA: 12/16/2015 IMPRESSION: 1. Tiny area of thrombus along the wall of the transverse aorta is likely atherosclerotic in origin. 2. Mildly hypodense lesion in the dome of the liver cannot be characterized as simple cyst. A hemangioma is a possibility. MR abdomen without and with contrast could be performed in further evaluation, as clinically indicated. 3.  Aortic atherosclerosis (ICD10-170.0). 4. Lower right rib fractures, as on 11/30/2015.  Echocardiogram: 01/05/2016 Study Conclusions  - Left ventricle: The cavity size was normal. Wall thickness was   increased in a pattern of mild LVH. Systolic function was normal.   The estimated ejection fraction was in the range of 55% to 60%.   Wall motion was normal; there were no regional wall motion   abnormalities. Doppler parameters are  consistent with abnormal   left ventricular relaxation (grade 1 diastolic dysfunction). - Aortic valve: There was trivial regurgitation. - Aortic root: The aortic root was mildly dilated. - Atrial septum: There was an atrial septal aneurysm.  Impressions:  - Normal LV systolic function; grade 1 diastolic dysfunction; mild   LVH; trace AI.  Assessment:    1. PAF (paroxysmal atrial fibrillation) ([place])   2. Syncope, unspecified syncope type   3. Bradycardia   4. Abnormal CT scan   5. Essential hypertension      Plan:   In order of problems listed above:  1. Paroxysmal Atrial Fibrillation - diagnosed in 12/2015 while hospitalized after falling 15 ft from the top of a ladder. Denies any repeat episdes of palpitations or dyspnea. Maintaining NSR on exam today which is verified with EKG. - continue Atenolol 25mg  BID for rate-control. - This patients CHA2DS2-VASc Score and unadjusted Ischemic Stroke Rate (% per year) is equal to 3.2 % stroke rate/year from a score of 3 (HTN, Age (2)). Anticoagulation initially not started as there was concern for a small intramural hematoma. Repeat imaging shows this is most consistent with atherosclerosis. Reviewed risks vs. benefits of anticoagulation with the patient in regards to PAF. He is in agreement to proceed. Will start Eliquis 5mg  BID.   2. Syncope, Unspecified Type - this was the patient's first syncopal episode as his initial event in 11/2015 was a mechanical fall when his ladder collapsed. Denies any prodromal symptoms.  - episode this time occurred after drinking 6-8 oz of Scotch and no other liquids the entire evening. Possible this event was secondary to orthostasis, granted orthostatic vitals the following day were normal.  - discussed event monitoring with the patient. He wishes to not proceed with this currently. If he has a repeat episode, would have a low-threshold to order a monitor to evaluate for post-termination pauses in regards  to his PAF.  3. Bradycardia - HR in low-50's on exam today. - agree with reducing Atenolol dosing from 50mg  BID to 25mg  BID as directed by PCP yesterday.  4. Abnormal CT Scan - CT Scan on 10/30 showed a small focus of decreased attenuation in the aortic arch, possibly a small intramural hematoma with no evidence of  dissection or transsection noted. CT surgery recommended a repeat CT in one week.  - repeat CT imaging on 11/15 which showed a "tiny  area of thrombus along the wall of the transverse aorta likely atherosclerotic in origin".  In reviewing notes on 11/22 from Dr. Vivi Martens office this reads "Dr. Cyndia Bent reviewed the scan and said it was o.k. and he didn't need to see him unless he just wanted to. I relayed this message to Travis Pruitt and he was quite satisfied with this resolution. The referral appointment was cancelled." - will start anticoagulation for PAF as above.   5. Essential HTN - BP at 130/90 today. - continue Atenolol (reduced dosing as above), Amlodipine, Losartan, and HCTZ. Patient will check BP at home. If SBP sustains > 140, would recommend increasing Amlodipine to 5mg  daily.     Medication Adjustments/Labs and Tests Ordered: Current medicines are reviewed at length with the patient today.  Concerns regarding medicines are outlined above.  Medication changes, Labs and Tests ordered today are listed in the Patient Instructions below. Patient Instructions  Medication Instructions:  DECREASE Atenolol to 25mg  BID Take 1 tablet by mouth twice a day, SAVINGS CARD GIVEN START Eliquis 5mg  Take 1 tablet by mouth twice a day  Labwork: None   Testing/Procedures: None   Follow-Up: Your physician recommends that you schedule a follow-up appointment in: [place]  Any Other Special Instructions Will Be Listed Below (If Applicable).     If you need a refill on your cardiac medications before your next appointment, please call your  pharmacy.     Signed, Erma Heritage, PA  01/07/2016 5:50 PM    [address], [place], [place]  [address] Phone: [phone]; Fax: [phone]  [address] [place], [place] [address] Phone: [phone]

## 2016-01-06 NOTE — Telephone Encounter (Signed)
Pt being seen in office currently

## 2016-01-06 NOTE — Assessment & Plan Note (Addendum)
80 year old male with a fib not on anticoagulation, hypertension, recent fall off 15 foot ladder and rib factures, hyperlipidemia presents after syncopal episode about 12 hours ago. Asymptomatic before and after.  - EKG with bradycardia (? A fib but regular rate)- will reduce atenolol. Leading theory at present is bradycardia related episode. May need cardiac monitoring.  - no prodromal symptoms and not orthostatic today- do not think vasovagal, situational. Do not think high probability orthostatic- appears hydrated -given atrial fibrillation, get CT to ensure no CVA. Prefer MRI but would take much longer to get this.  - doubt MI, but get troponin today to rule out. May need stress testing - update cbc, cmp - get close cardiology follow up -already had echocardiogram- no clear structural causes of episode -no calf pain/tenderness- doubt PE/DVT. sats are somewhat lower than normal though- will monitor - no driving for 6 months discussed at present given unclear etiology

## 2016-01-06 NOTE — Telephone Encounter (Signed)
Patient Name: Travis Pruitt  DOB: [birth year]    Initial Comment BP 163/88, no symptoms today, BP did drop last night to where he passed out.    Nurse Assessment  Nurse: Raphael Gibney, RN, Vanita Ingles Date/Time (Eastern Time): 01/06/2016 9:52:26 AM  Confirm and document reason for call. If symptomatic, describe symptoms. ---Caller states BP is 163/77. He was walking to the bathroom at midnight. He passed out. He was disoriented. BP at midnight 114/52; 126/61 at 12:07 am and 130/63 at 12:11 am. No symptoms now. He had an accident in October. He was on a ladder and it collapsed. Had echocardiogram yesterday.  Does the patient have any new or worsening symptoms? ---Yes  Will a triage be completed? ---Yes  Related visit to physician within the last 2 weeks? ---No  Does the PT have any chronic conditions? (i.e. diabetes, asthma, etc.) ---Yes  List chronic conditions. ---HTN  Is this a behavioral health or substance abuse call? ---No     Guidelines    Guideline Title Affirmed Question Affirmed Notes  Fainting [1] Age > 50 years AND [2] now alert and feels fine    Final Disposition User   Go to ED Now (or PCP triage) Raphael Gibney, RN, Vera    Comments  No appts available at [facility] within 4 hrs. Pt does not want to go to urgent care or ER. He would like to see Dr. Yong Channel if possible or to know what Dr. Yong Channel would recommend.  Pt does not know how long he was passed out.  Called back line and spoke to [place] and gave report that pt passed out last night. Does not know how long he was passed out. BP 114/52 at that time. Did not call 911. Triage outcome of go to ER now (or PCP triage). No appts available within 4 hrs. Pt does not want to go to urgent care or ER and would like to see Dr. Yong Channel or find out from Dr. Yong Channel what he needs to do.   Referrals  GO TO FACILITY REFUSED   Disagree/Comply: Disagree  Disagree/Comply Reason: Disagree with instructions

## 2016-01-06 NOTE — Assessment & Plan Note (Signed)
Rate is regular but no clear p waves on EKG (fair amount of movement). Bradycardic though- reducing atenolol. Would like to start eliquis but awaiting approval from cardiothoracic surgery. With repeat CT- hopeful cardiology may be able to comment and that we may be able to start patient sooner. Due to a fib history and syncope- will get CT to evaluate for CVA but no clear neurological deficits toay.

## 2016-01-07 ENCOUNTER — Encounter: Payer: Self-pay | Admitting: Student

## 2016-01-07 ENCOUNTER — Ambulatory Visit (INDEPENDENT_AMBULATORY_CARE_PROVIDER_SITE_OTHER): Payer: 59 | Admitting: Student

## 2016-01-07 VITALS — BP 130/90 | HR 52 | Ht 70.0 in | Wt 182.0 lb

## 2016-01-07 DIAGNOSIS — I10 Essential (primary) hypertension: Secondary | ICD-10-CM

## 2016-01-07 DIAGNOSIS — R938 Abnormal findings on diagnostic imaging of other specified body structures: Secondary | ICD-10-CM | POA: Diagnosis not present

## 2016-01-07 DIAGNOSIS — R55 Syncope and collapse: Secondary | ICD-10-CM | POA: Diagnosis not present

## 2016-01-07 DIAGNOSIS — I480 Paroxysmal atrial fibrillation: Secondary | ICD-10-CM

## 2016-01-07 DIAGNOSIS — R001 Bradycardia, unspecified: Secondary | ICD-10-CM

## 2016-01-07 DIAGNOSIS — R9389 Abnormal findings on diagnostic imaging of other specified body structures: Secondary | ICD-10-CM

## 2016-01-07 MED ORDER — ATENOLOL 25 MG PO TABS
25.0000 mg | ORAL_TABLET | Freq: Two times a day (BID) | ORAL | 3 refills | Status: DC
Start: 2016-01-07 — End: 2016-02-05

## 2016-01-07 MED ORDER — ATENOLOL 25 MG PO TABS: 50.0000 mg | ORAL_TABLET | Freq: Two times a day (BID) | ORAL | 3 refills | Status: DC

## 2016-01-07 MED ORDER — APIXABAN 5 MG PO TABS: 5.0000 mg | ORAL_TABLET | Freq: Two times a day (BID) | ORAL | 0 refills | Status: DC

## 2016-01-07 MED ORDER — APIXABAN 5 MG PO TABS: 5.0000 mg | ORAL_TABLET | Freq: Two times a day (BID) | ORAL | 2 refills | Status: DC

## 2016-01-07 NOTE — Patient Instructions (Addendum)
Medication Instructions:  DECREASE Atenolol to 25mg  Take 1 tablet by mouth twice a day, SAVINGS CARD GIVEN START Eliquis 5mg  Take 1 tablet by mouth twice a day  Labwork: None   Testing/Procedures: None   Follow-Up: Your physician recommends that you schedule a follow-up appointment in: [place]   Any Other Special Instructions Will Be Listed Below (If Applicable).     If you need a refill on your cardiac medications before your next appointment, please call your pharmacy.

## 2016-01-07 NOTE — Progress Notes (Signed)
Error. Pt moved from nurse visit to provider

## 2016-01-07 NOTE — Telephone Encounter (Signed)
Spoke to patient regarding phone call. Pt reports that he is waiting for Dr. Ansel Bong opinion for starting anticoagulation. He states he very much values his opinion in these matters. He will contact our office after his visit with Dr. Yong Channel on 01/12/16 if we can be of any assistance in helping with anticoagulant initiation. He also states that moving forward he would prefer to only see Dr. Stanford Breed. He was provided the phone number to the Coumadin clinic for any additional concerns.

## 2016-01-12 ENCOUNTER — Ambulatory Visit (INDEPENDENT_AMBULATORY_CARE_PROVIDER_SITE_OTHER): Payer: 59 | Admitting: Family Medicine

## 2016-01-12 ENCOUNTER — Encounter: Payer: Self-pay | Admitting: Family Medicine

## 2016-01-12 DIAGNOSIS — I10 Essential (primary) hypertension: Secondary | ICD-10-CM

## 2016-01-12 DIAGNOSIS — I480 Paroxysmal atrial fibrillation: Secondary | ICD-10-CM | POA: Diagnosis not present

## 2016-01-12 DIAGNOSIS — R55 Syncope and collapse: Secondary | ICD-10-CM | POA: Diagnosis not present

## 2016-01-12 NOTE — Assessment & Plan Note (Signed)
S: controlled on atenolol 25mg  BID, losartan 100mg , hctz 25mg , amlodipine 2.5 mg (added last visit) BP Readings from Last 3 Encounters:  01/12/16 104/70  01/07/16 130/90  01/06/16 (!) 156/78  A/P:Continue current medications but stop recently started amlodipine. Home BPs in low 100s. Encouraged him to update me next week and we would decide on long term use. We want to avoid causing orthostatic symptoms which may have bene cause of syncopal episode- he is not having symptoms and is doing better job with hydration but want BP goal closer to 140/90 with orthostatic history

## 2016-01-12 NOTE — Progress Notes (Signed)
Pre visit review using our clinic review tool, if applicable. No additional management support is needed unless otherwise documented below in the visit note. 

## 2016-01-12 NOTE — Assessment & Plan Note (Signed)
S: remains regular rate and rhythm- appears in sinus. Bradycardia improved now at over 60 with reduction to atenolol 25mg BID. Started on eliquis 5 mg BID A/P: we discussed now that he is on preventative for stroke and that he had normal neuro exam last week- will cancel head MRI because even if positive would not change our management at this point

## 2016-01-12 NOTE — Progress Notes (Signed)
Subjective:  Travis Pruitt is a 80 y.o. year old very pleasant male patient who presents for/with See problem oriented charting ROS- no syncope. No chest pain or shortness of breath. No lightheadedness or dizziness  Past Medical History-  Patient Active Problem List   Diagnosis Date Noted  . Syncope 01/06/2016    Priority: High  . PAF (paroxysmal atrial fibrillation) ([place]) 12/10/2015    Priority: High  . Paroxysmal supraventricular tachycardia ([place]) 12/03/2015    Priority: High  . Multiple rib fractures 11/30/2015    Priority: Medium  . Generalized anxiety disorder 11/27/2007    Priority: Medium  . Hyperlipidemia 10/25/2006    Priority: Medium  . Gout 10/25/2006    Priority: Medium  . Essential hypertension 10/25/2006    Priority: Medium  . Hx of adenomatous colonic polyps 01/12/2015    Priority: Low    Medications- reviewed and updated Current Outpatient Prescriptions  Medication Sig Dispense Refill  . allopurinol (ZYLOPRIM) 100 MG tablet TAKE 1 TABLET (100 MG TOTAL) BY MOUTH DAILY. 90 tablet 1  . amLODipine (NORVASC) 2.5 MG tablet Take 1 tablet (2.5 mg total) by mouth daily. 30 tablet 3  . apixaban (ELIQUIS) 5 MG TABS tablet Take 1 tablet (5 mg total) by mouth 2 (two) times daily. 180 tablet 2  . atenolol (TENORMIN) 25 MG tablet Take 1 tablet (25 mg total) by mouth 2 (two) times daily. 180 tablet 3  . hydrochlorothiazide (HYDRODIURIL) 25 MG tablet Take 1 tablet (25 mg total) by mouth daily. 90 tablet 3  . ibuprofen (ADVIL,MOTRIN) 200 MG tablet Take 400 mg by mouth every 6 (six) hours as needed for moderate pain.    Marland Kitchen losartan (COZAAR) 100 MG tablet Take 1 tablet (100 mg total) by mouth daily. 30 tablet 3     Objective: BP 104/70 (BP Location: Left Arm, Patient Position: Sitting, Cuff Size: Large)   Pulse 63   Temp 97.7 F (36.5 C) (Oral)   Ht 5\' 10"  (1.778 m)   Wt 182 lb 3.2 oz (82.6 kg)   SpO2 99%   BMI 26.14 kg/m  Gen: NAD, resting comfortably CV: RRR no  murmurs rubs or gallops Lungs: CTAB no crackles, wheeze, rhonchi Abdomen: soft/nontender/nondistended/normal bowel sounds. No rebound or guarding.  Ext: no edema Skin: warm, dry  Assessment/Plan:  PAF (paroxysmal atrial fibrillation) ([facility]) S: remains regular rate and rhythm- appears in sinus. Bradycardia improved now at over 60 with reduction to atenolol 25mg BID. Started on eliquis 5 mg BID A/P: we discussed now that he is on preventative for stroke and that he had normal neuro exam last week- will cancel head MRI because even if positive would not change our management at this point  Essential hypertension S: controlled on atenolol 25mg  BID, losartan 100mg , hctz 25mg , amlodipine 2.5 mg (added last visit) BP Readings from Last 3 Encounters:  01/12/16 104/70  01/07/16 130/90  01/06/16 (!) 156/78  A/P:Continue current medications but stop recently started amlodipine. Home BPs in low 100s. Encouraged him to update me next week and we would decide on long term use. We want to avoid causing orthostatic symptoms which may have bene cause of syncopal episode- he is not having symptoms and is doing better job with hydration but want BP goal closer to 140/90 with orthostatic history  Syncope S: syncopal episode last week-extensive workup in office then saw cardiology next morning and they thought likely orthostatic in etiology (he had not shared 6-8 oz of scotch with me and no liquids  rest of evening on night of synocpe)- did not advise further follow up at present- including no stress testing. They discussed cardiac monitoring but he declined. Has not had head CT yet. Labs looked fine including negative troponin a week ago. He was also started on eliquis for stroke prevention 5 mg BID. CTCS released him from abnormal CT scan with concern of intramural hematoma as thought just to be thrombus. No further episodes since that time A/P: possible orthostatic syncope (reducing BP meds). Now on eliquis 5mg   BID- less concern for CVA and even if was CVA management wouldn't change so cancelled this. He knows to return to care if recurrent symptoms and would have to pursue cardiac monitoring at that point. I was also concerned bradycardia could have caused episode and HR in much better place on lower dose of atenolol   Cards follow up early January- good chance to check on BP in person- for now will monitor at home and update me after holding amlodipine for a week.   Return precautions advised.  Garret Reddish, MD

## 2016-01-12 NOTE — Patient Instructions (Signed)
Hold amlodipine for now. Update me early next week with blood pressures by mychart.   No other changes  Do not allow them to schedule your CT at this point- now that you are on blood thinner do not think you need this

## 2016-01-12 NOTE — Addendum Note (Signed)
Addended by: Marin Olp on: 01/12/2016 01:59 PM   Modules accepted: Orders

## 2016-01-12 NOTE — Assessment & Plan Note (Signed)
S: syncopal episode last week-extensive workup in office then saw cardiology next morning and they thought likely orthostatic in etiology (he had not shared 6-8 oz of scotch with me and no liquids rest of evening on night of synocpe)- did not advise further follow up at present- including no stress testing. They discussed cardiac monitoring but he declined. Has not had head CT yet. Labs looked fine including negative troponin a week ago. He was also started on eliquis for stroke prevention 5 mg BID. CTCS released him from abnormal CT scan with concern of intramural hematoma as thought just to be thrombus. No further episodes since that time A/P: possible orthostatic syncope (reducing BP meds). Now on eliquis 5mg  BID- less concern for CVA and even if was CVA management wouldn't change so cancelled this. He knows to return to care if recurrent symptoms and would have to pursue cardiac monitoring at that point. I was also concerned bradycardia could have caused episode and HR in much better place on lower dose of atenolol

## 2016-01-14 ENCOUNTER — Ambulatory Visit (INDEPENDENT_AMBULATORY_CARE_PROVIDER_SITE_OTHER): Payer: 59 | Admitting: Podiatry

## 2016-01-14 ENCOUNTER — Ambulatory Visit (INDEPENDENT_AMBULATORY_CARE_PROVIDER_SITE_OTHER): Payer: 59

## 2016-01-14 ENCOUNTER — Other Ambulatory Visit: Payer: Self-pay | Admitting: *Deleted

## 2016-01-14 ENCOUNTER — Encounter: Payer: Self-pay | Admitting: Podiatry

## 2016-01-14 VITALS — BP 137/82 | HR 63 | Resp 16

## 2016-01-14 DIAGNOSIS — L600 Ingrowing nail: Secondary | ICD-10-CM | POA: Diagnosis not present

## 2016-01-14 DIAGNOSIS — M79675 Pain in left toe(s): Secondary | ICD-10-CM | POA: Diagnosis not present

## 2016-01-14 MED ORDER — DOXYCYCLINE HYCLATE 100 MG PO TABS: 100.0000 mg | ORAL_TABLET | Freq: Two times a day (BID) | ORAL | 0 refills | Status: DC

## 2016-01-14 MED ORDER — NEOMYCIN-POLYMYXIN-HC 1 % OT SOLN: OTIC | 1 refills | Status: DC

## 2016-01-14 NOTE — Progress Notes (Signed)
   Subjective:    Patient ID: Travis Pruitt, male    DOB: 01/19/1936, 80 y.o.   MRN: [id number]  HPI: He presents today with a chief complaint of pain to the fourth toe of the left foot along the lateral border. States it's been tender for several months red and swollen.  Review of Systems  Constitutional: Positive for appetite change and unexpected weight change.  All other systems reviewed and are negative.      Objective:   Physical Exam: R signs are stable alert and oriented 3. Pulses are palpable. Neurologic sensorium is intact. Reflexes are intact. Neurovascular status is intact. Orthopedic evaluation of his result was distal to the angle full range of motion without crepitus mild hammertoe deformities are noted. Sharp innervated now marginal fibular border of the hallux left. Exquisitely tender on palpation. Purulence of malodor is present. There is to be infected.          Assessment & Plan:  Ingrown nail tibial border fourth toe left foot.  Plan: An I and D with chemical matrixectomy was performed today after local anesthesia was a banister. He tolerated the procedure well. He was provided with both oral and written home-going instructions for care and soaking of his toe. He is also provided prescriptions for Cortisporin Otic to be applied twice daily after soaking. I will follow-up with him in 1-2 weeks just for reevaluation.

## 2016-01-14 NOTE — Patient Instructions (Signed)

## 2016-01-15 ENCOUNTER — Telehealth: Payer: Self-pay | Admitting: *Deleted

## 2016-01-15 NOTE — Telephone Encounter (Addendum)
Pt states he didn't get the antibiotic and would like an alternative. Left message to call again, but to definitely get the oral antibiotic and if the drops were too expensive or if the insurance wouldn't cover to switch to neosporin, polysporin or bacitracin.

## 2016-01-20 ENCOUNTER — Telehealth: Payer: Self-pay | Admitting: Family Medicine

## 2016-01-20 NOTE — Telephone Encounter (Signed)
Pt presented in office wanting to know how to get into his MyChart to message his BP results.  I tried to assist pt with recovering log-in info but pt does not have E-mail on file.  Gave pt MyChart support number to contact.  Pt wants [place] to know that his BP has been averaging 135/70.

## 2016-01-21 NOTE — Telephone Encounter (Signed)
Spoke to the patient who verbalized understanding.

## 2016-01-21 NOTE — Telephone Encounter (Signed)
Blood pressure looks great- continue current medications

## 2016-01-26 ENCOUNTER — Telehealth: Payer: Self-pay | Admitting: Family Medicine

## 2016-01-26 NOTE — Telephone Encounter (Signed)
Pt state since is accident he has not had an appetite and has lost 14lbs in the last two weeks would like to know what he should be doing.  Pt is aware Dr. Yong Channel will not be back in the office until February 02, 2016.

## 2016-01-27 NOTE — Telephone Encounter (Signed)
I would schedule an ensure or similar such as boost supplement in between meals to see if we can help stabilize weight. I would do this twice a day bu tthen still eat regular meal at mealtime

## 2016-01-28 ENCOUNTER — Telehealth: Payer: Self-pay | Admitting: Student

## 2016-01-28 NOTE — Telephone Encounter (Signed)
Spoke to patient.   He notes that he has felt for at least the last 3 weeks, though admits possibly longer, that he's had a poor appetite. He expresses little interest in food. Feels this is a medication side effect, possibly related to the Eliquis.   He was last seen on 12/7 in office by [place]. He was started on Eliquis, and his dose of atenolol reduced at that time. He reports he is not taking amlodipine 2.5mg  daily at [facility] of Dr. Yong Channel. He reports he is done w doxycycline and cortisporin. I've updated med list to reflect patient's reported regimen. Asked about BPs as he's been off amlodipine. He notes his BPs have been 135-140/70-80.   I did review Dr. Ansel Bong recommendations for boost/ensure supplements to help patient stabilize weight. Discussed this suggestion w patient in detail, who verbalized understanding and will follow this suggestion.  He has a f/u w Dr. Stanford Breed in 1 week. In interim, request review by pharmD of his current meds for possible appetite related SEs.

## 2016-01-28 NOTE — Telephone Encounter (Signed)
Further discussion w patient. He shared weight trends. Recorded for chart review: 11/9 189 lbs 12/14 182 lbs 12/28 175 lbs  I reviewed recommendations w him. Noted that HCTZ and doxycycline not associated w anorexia.   Patient wants to make sure the Eliquis was not at cause for his appetite issues. Please advise.

## 2016-01-28 NOTE — Telephone Encounter (Signed)
Anorexia is listed as rarely being associated with HCTZ and doxycycline. I am doubtful that these medications are causing his symptoms though and at this time would recommend that he continue medications as prescribed and keep follow up with Dr. Stanford Breed as scheduled.

## 2016-01-28 NOTE — Telephone Encounter (Signed)
New Message  Pt states he has lost his appetite. Pt states he feels it could be cause by the medication change. Pt states he has lost up to 20lbs within weeks. Please call back to discuss

## 2016-01-28 NOTE — Telephone Encounter (Signed)
Eliquis has not been associated with anorexia or loss of appetite in the literature.

## 2016-01-29 NOTE — Telephone Encounter (Signed)
Spoke to pt, told him Dr. Yong Channel would recommend  ensure or similar such as boost supplement in between meals to see if we can help stabilize weight. I would do this twice a day but then still eat regular meal at mealtime. Pt verbalized understanding and stated he just going to start Ensure. Told him okay good.

## 2016-01-31 NOTE — Progress Notes (Signed)
HPI: FU PAF. Admiitted 11/17 after falling off ladder with rib fxs and small intramural hematoma aortic arch but no dissection or transection. Developed PAF and SVT during that hospitalization. Echo 12/17 showed normal LV function and mildly dilated aortic root. Pt had ECG changes with tachycardia while hospitalized and functional study felt possibly needed after DC. Seen 12/17 after having a syncopal episode believed to be micturition related. Also noted to have HR 50 and atenolol reduced. Since last seen, no chest pain, dyspnea or palpitations; no recurrent syncope. Complains of decreased appetite and fatigue.  Current Outpatient Prescriptions  Medication Sig Dispense Refill  . allopurinol (ZYLOPRIM) 100 MG tablet TAKE 1 TABLET (100 MG TOTAL) BY MOUTH DAILY. 90 tablet 1  . atenolol (TENORMIN) 25 MG tablet Take 1 tablet (25 mg total) by mouth 2 (two) times daily. (Patient taking differently: Take 50 mg by mouth 2 (two) times daily. ) 180 tablet 3  . ibuprofen (ADVIL,MOTRIN) 200 MG tablet Take 400 mg by mouth every 6 (six) hours as needed for moderate pain.     No current facility-administered medications for this visit.      Past Medical History:  Diagnosis Date  . Anxiety   . Colon polyp   . COLONIC POLYPS, ADENOMATOUS, HX OF 11/11/2003   Annotation: COLONOSCOPY 10/05 ([facility]) COLONOSCOPY 04/11/07 Qualifier: Diagnosis of  By: Carlean Purl MD, Dimas Millin Diverticulosis   . Gout   . Hx of adenomatous colonic polyps 01/12/2015  . Hyperlipidemia   . Hypertension   . PAF (paroxysmal atrial fibrillation) ([place])    a. diagnosed in 12/2015 b. started on Eliquis for anticoagulation    Past Surgical History:  Procedure Laterality Date  . COLONOSCOPY    . INGUINAL HERNIA REPAIR      Social History   Social History  . Marital status: Married    Spouse name: N/A  . Number of children: N/A  . Years of education: N/A   Occupational History  . Not on file.   Social History  Main Topics  . Smoking status: Former Smoker    Quit date: 07/22/1970  . Smokeless tobacco: Never Used     Comment: 50 years ago  . Alcohol use 4.2 oz/week    7 Shots of liquor per week  . Drug use: No  . Sexual activity: Not on file   Other Topics Concern  . Not on file   Social History Narrative   Married    Family History  Problem Relation Age of Onset  . Cancer Mother     lung  . Colon cancer Neg Hx     ROS: fatigue and decreased appetite but no fevers or chills, productive cough, hemoptysis, dysphasia, odynophagia, melena, hematochezia, dysuria, hematuria, rash, seizure activity, orthopnea, PND, pedal edema, claudication. Remaining systems are negative.  Physical Exam: Well-developed well-nourished in no acute distress.  Skin is warm and dry.  HEENT is normal.  Neck is supple.  Chest is clear to auscultation with normal expansion.  Cardiovascular exam is regular rate and rhythm.  Abdominal exam nontender or distended. No masses palpated. Extremities show no edema. neuro grossly intact  A/P  1 Paroxysmal atrial fibrillation-the patient remains in sinus rhythm on examination. He is having fatigue; change atenolol to 50 mg in AM and 25 mg in PM. Pt declines apixaban and understands higher risk of CVA.  2 syncope-felt micturition related. No further episodes. We will follow.  3 possible small intramural hematoma  in aorta on previous CT scan- we likely repeat CTA December 2018.  4 electrocardiogram abnormalities with tachycardia in the hospital-Patient had ST depression with tachycardia in the hospital ;we will arrange a stress echocardiogram to further assess.  5 hypertension-Blood pressure is borderline but he follow this closely at home. We will continue to track and add additional medications as needed.  Kirk Ruths, MD

## 2016-02-02 ENCOUNTER — Encounter: Payer: Self-pay | Admitting: Podiatry

## 2016-02-02 ENCOUNTER — Ambulatory Visit (INDEPENDENT_AMBULATORY_CARE_PROVIDER_SITE_OTHER): Payer: Self-pay | Admitting: Podiatry

## 2016-02-02 DIAGNOSIS — L600 Ingrowing nail: Secondary | ICD-10-CM

## 2016-02-02 NOTE — Patient Instructions (Signed)

## 2016-02-02 NOTE — Progress Notes (Signed)
He presents today for follow-up of his matrixectomy fourth digit left foot. He states that he doesn't look too good but it feels a lot better. He states that he has not soaked it over the last couple of days.  Objective: Vital signs are stable he is alert and oriented 3. Pulses are palpable. There is no erythema cellulitis drainage or odor. Nice scab is present. No signs of infection.  Assessment: Well-healing surgical toe lateral border fourth left.  Plan: I instructed him to continue to soak in Epsom salts and warm water to just make certain that this has gone on to heal uneventfully. I requested that he cover during the daytime and leave open at night if nothing is changed over the next week then he may discontinue the medication and soaking.

## 2016-02-02 NOTE — Telephone Encounter (Signed)
No answer when dialed. Pt received instruction from Dr. Ansel Bong office and has a f/u w Dr. Stanford Breed this week.

## 2016-02-03 ENCOUNTER — Telehealth: Payer: Self-pay | Admitting: Cardiology

## 2016-02-03 NOTE — Telephone Encounter (Signed)
New Message   Patient would like a call regarding upcoming appointment.

## 2016-02-03 NOTE — Telephone Encounter (Signed)
Left msg for patient to call. 

## 2016-02-04 NOTE — Telephone Encounter (Signed)
LMTCB

## 2016-02-05 ENCOUNTER — Encounter: Payer: Self-pay | Admitting: Cardiology

## 2016-02-05 ENCOUNTER — Ambulatory Visit (INDEPENDENT_AMBULATORY_CARE_PROVIDER_SITE_OTHER): Payer: 59 | Admitting: Cardiology

## 2016-02-05 ENCOUNTER — Telehealth: Payer: Self-pay

## 2016-02-05 VITALS — BP 134/68 | HR 64 | Ht 70.0 in | Wt 180.0 lb

## 2016-02-05 DIAGNOSIS — I10 Essential (primary) hypertension: Secondary | ICD-10-CM | POA: Diagnosis not present

## 2016-02-05 DIAGNOSIS — I480 Paroxysmal atrial fibrillation: Secondary | ICD-10-CM | POA: Diagnosis not present

## 2016-02-05 DIAGNOSIS — R9431 Abnormal electrocardiogram [ECG] [EKG]: Secondary | ICD-10-CM

## 2016-02-05 DIAGNOSIS — I7410 Embolism and thrombosis of unspecified parts of aorta: Secondary | ICD-10-CM | POA: Diagnosis not present

## 2016-02-05 MED ORDER — ATENOLOL 25 MG PO TABS: ORAL_TABLET | ORAL | 3 refills | Status: DC

## 2016-02-05 NOTE — Patient Instructions (Signed)
Medication Instructions:   CHANGE ATENOLOL TO 50 MG IN THE MORNING AND 25 MG IN THE EVENING= 2 TABLETS IN THE AM AND 1 TABLET IN THE PM  Testing/Procedures:  Your physician has requested that you have a stress echocardiogram. For further information please visit [email or web address]. Please follow instruction sheet as given.CALL [phone] TO SCHEDULE IN MARCH    Follow-Up:  Your physician recommends that you schedule a follow-up appointment AFTER STRESS TEST COMPLETE

## 2016-02-05 NOTE — Telephone Encounter (Signed)
Called patient to schedule him to see Dr. Yong Channel per Dr. Ansel Bong request. No answer received and unable to leave voicemail as no answering machine

## 2016-02-18 ENCOUNTER — Ambulatory Visit: Payer: Medicare Other | Admitting: Physician Assistant

## 2016-03-17 ENCOUNTER — Encounter: Payer: Self-pay | Admitting: Family Medicine

## 2016-03-17 ENCOUNTER — Ambulatory Visit (INDEPENDENT_AMBULATORY_CARE_PROVIDER_SITE_OTHER): Payer: 59 | Admitting: Family Medicine

## 2016-03-17 VITALS — BP 138/84 | HR 54 | Temp 97.7°F | Ht 70.0 in | Wt 185.2 lb

## 2016-03-17 DIAGNOSIS — I10 Essential (primary) hypertension: Secondary | ICD-10-CM

## 2016-03-17 DIAGNOSIS — G8929 Other chronic pain: Secondary | ICD-10-CM | POA: Diagnosis not present

## 2016-03-17 DIAGNOSIS — M25511 Pain in right shoulder: Secondary | ICD-10-CM | POA: Diagnosis not present

## 2016-03-17 NOTE — Assessment & Plan Note (Signed)
S: controlled on atenolol- usually taking 50mg  twice a day. Some readings at home have been higher but also has had many in 120s and 130s. Home cuff had been slightly higher than ours  BP Readings from Last 3 Encounters:  03/17/16 138/84  02/05/16 134/68  01/14/16 137/82  A/P:Continue current meds given good control in office.

## 2016-03-17 NOTE — Patient Instructions (Addendum)
Blood pressure looks ok today- lets continue atenolol 50mg  in the morning and evening  Try home exercises at least 3 days a week for a month. dont do anything that causes more than 1-2/10 pain.   May also have some arthritis in the shoulder but lets make sure all the muscles are strong to prevent that from being the cause   ______________________________________________________________________  Starting October 1st 2018, I will be transferring to our new location: [facility] [place] (corner of [address] from [employer]) [place], [place] Phone: [phone]  I would love to have you remain my patient at this new location as long as it remains convenient for you. I am excited about the opportunity to have x-ray and sports medicine in the new building but will really miss the awesome staff and physicians at [facility]. Continue to schedule appointments at [facility] and we will automatically transfer them to the [place] location starting October 1st.

## 2016-03-17 NOTE — Progress Notes (Signed)
Subjective:  Travis Pruitt is a 81 y.o. year old very pleasant male patient who presents for/with See problem oriented charting ROS- Some blood in stools after fall that have resolved- still plans to see GI, some less frequent bowel movements. No chest pain or shortness of breath. No headache or blurry vision.   Past Medical History-  Patient Active Problem List   Diagnosis Date Noted  . Syncope 01/06/2016    Priority: High  . PAF (paroxysmal atrial fibrillation) ([place]) 12/10/2015    Priority: High  . Paroxysmal supraventricular tachycardia ([place]) 12/03/2015    Priority: High  . Multiple rib fractures 11/30/2015    Priority: Medium  . Generalized anxiety disorder 11/27/2007    Priority: Medium  . Hyperlipidemia 10/25/2006    Priority: Medium  . Gout 10/25/2006    Priority: Medium  . Essential hypertension 10/25/2006    Priority: Medium  . Hx of adenomatous colonic polyps 01/12/2015    Priority: Low    Medications- reviewed and updated Current Outpatient Prescriptions  Medication Sig Dispense Refill  . allopurinol (ZYLOPRIM) 100 MG tablet TAKE 1 TABLET (100 MG TOTAL) BY MOUTH DAILY. 90 tablet 1  . atenolol (TENORMIN) 25 MG tablet 2 tablets every morning and 2 tablet every evening 270 tablet 3  . ibuprofen (ADVIL,MOTRIN) 200 MG tablet Take 400 mg by mouth every 6 (six) hours as needed for moderate pain.      Objective: BP 138/84 (BP Location: Left Arm, Patient Position: Sitting, Cuff Size: Large)   Pulse (!) 54   Temp 97.7 F (36.5 C) (Oral)   Ht 5\' 10"  (1.778 m)   Wt 185 lb 3.2 oz (84 kg)   SpO2 92%   BMI 26.57 kg/m  Gen: NAD, resting comfortably [id number] bradycardic- no murmurs rubs or gallops Lungs: CTAB no crackles, wheeze, rhonchi  Ext: no edema Skin: warm, dry, no rash over shoulder  Right Shoulder: Inspection reveals no abnormalities, atrophy or asymmetry. Palpation is normal with no tenderness over AC joint or bicipital groove. ROM is full in all  planes. Does have some pain with abduction and forward flexion.  Rotator cuff strength normal throughout.  No signs of impingement with negative Neer and Hawkin's tests, empty can.  no drop arm sign.  Assessment/Plan:  Chronic right shoulder pain S: right shoulder soreness on right side particularly when he lays on it. This was the shoulder he fell on during his accident from 15 foot ladder in October 2017. Mainly worst at night but also some overhead issues.  A/P: Suspect may have element of rotator cuff involvement. Will trial home exercise regimen from sports medicine advisor   Essential hypertension S: controlled on atenolol- usually taking 50mg  twice a day. Some readings at home have been higher but also has had many in 120s and 130s. Home cuff had been slightly higher than ours  BP Readings from Last 3 Encounters:  03/17/16 138/84  02/05/16 134/68  01/14/16 137/82  A/P:Continue current meds given good control in office.   Return precautions advised.  Garret Reddish, MD

## 2016-03-17 NOTE — Progress Notes (Signed)
Pre visit review using our clinic review tool, if applicable. No additional management support is needed unless otherwise documented below in the visit note. 

## 2016-03-22 ENCOUNTER — Encounter: Payer: Self-pay | Admitting: Internal Medicine

## 2016-03-22 ENCOUNTER — Ambulatory Visit (INDEPENDENT_AMBULATORY_CARE_PROVIDER_SITE_OTHER): Payer: 59 | Admitting: Internal Medicine

## 2016-03-22 VITALS — BP 148/70 | HR 60 | Ht 69.5 in | Wt 184.0 lb

## 2016-03-22 DIAGNOSIS — K5730 Diverticulosis of large intestine without perforation or abscess without bleeding: Secondary | ICD-10-CM

## 2016-03-22 DIAGNOSIS — L700 Acne vulgaris: Secondary | ICD-10-CM | POA: Diagnosis not present

## 2016-03-22 DIAGNOSIS — R194 Change in bowel habit: Secondary | ICD-10-CM

## 2016-03-22 DIAGNOSIS — K648 Other hemorrhoids: Secondary | ICD-10-CM

## 2016-03-22 NOTE — Patient Instructions (Signed)
   Just observe things and let us know if you have any other problems.      I appreciate the opportunity to care for you. Silvano Rusk, MD,  [facility]

## 2016-03-22 NOTE — Progress Notes (Signed)
   Travis Pruitt 81 y.o. 08/11/1935 [id number]  Assessment & Plan:   1. Change in bowel habits   2. Diverticulosis of colon without hemorrhage   3. Bleeding internal hemorrhoids   4. Comedones - perianal    He had a colonoscopy in 2016 with diverticulosis hemorrhoids and 2 small polyps. I don't think we need to repeat that. I'm not sure why he had this mild change in bowel habits. He is tolerating this he doesn't want to take any supplements or anything so we will observe and follow back as needed. I don't think these comedones around the anus mean anything. They look innocent I cannot find anything to be concerned about that's associated with these from investigation. He will keep an eye on these.   Subjective:   Chief Complaint: change in bowel habits  HPI The patient is here because of a change in bowel habits that occurred after he fell approximate 50 feet from a ladder. He injured his shoulder he landed on his side. Soon thereafter he lost his appetite he was eating so well then he became constipated and had some rectal bleeding for a few weeks. Bright red with wiping and some into the commode. That has resolved but his bowel habits are still irregular. Going about twice a day and used to be once a day. Bowel movement has become "compartmentalized" small balls of stool and pieces. Used to be perfectly regular but after fall from ladder not so No abdominal or rectal pain whatsoever. I don't think he was taking narcotics to any significant degree either from talking to him.  Medications, allergies, past medical history, past surgical history, family history and social history are reviewed and updated in the EMR.  Wt Readings from Last 3 Encounters:  03/22/16 184 lb (83.5 kg)  03/17/16 185 lb 3.2 oz (84 kg)  02/05/16 180 lb (81.6 kg)    Review of Systems As above still having some shoulder pain and sleep disruption  Objective:   Physical Exam BP (!) 148/70   Pulse  60   Ht 5' 9.5" (1.765 m)   Wt 184 lb (83.5 kg)   BMI 26.78 kg/m  No Acute distress Rectal exam, there are perianal comedones, he has normal resting tone no mass brown heme-negative stool and normal prostate.  15 minutes time spent with patient > half in counseling coordination of care

## 2016-03-24 ENCOUNTER — Encounter: Payer: Self-pay | Admitting: *Deleted

## 2016-03-25 ENCOUNTER — Encounter: Payer: Self-pay | Admitting: Family Medicine

## 2016-03-25 ENCOUNTER — Ambulatory Visit (INDEPENDENT_AMBULATORY_CARE_PROVIDER_SITE_OTHER): Payer: 59 | Admitting: Family Medicine

## 2016-03-25 ENCOUNTER — Telehealth: Payer: Self-pay | Admitting: Family Medicine

## 2016-03-25 VITALS — BP 140/82 | HR 61 | Temp 97.9°F | Ht 69.5 in | Wt 186.0 lb

## 2016-03-25 DIAGNOSIS — K0889 Other specified disorders of teeth and supporting structures: Secondary | ICD-10-CM | POA: Diagnosis not present

## 2016-03-25 NOTE — Telephone Encounter (Signed)
Pt has been sch with dr Maudie Mercury

## 2016-03-25 NOTE — Progress Notes (Signed)
HPI:  Acute visit for toothache: -R upper 2nd to back tooth at sight of missing tooth -started about 3-4 weeks ago -saw his dentist but felt she misdiagnosed it so he saw another dentist yesterday evening whom advised a tooth canal -he saw his endodontist last night whom he happens to know so has a root canal set up in 3 days -he was thinking this morning maybe he should start an antibiotic in the interim -this was not advised by either dentist, he called his prior dentist and endodontist today and they did not rx abx -the dentist he saw yesterday was not in today so he came here -denies worsening pain, fevers, malaise, drooling, difficulty opening or closing jaw, trouble swallowing, pus or drainage, jaw pain   ROS: See pertinent positives and negatives per HPI.  Past Medical History:  Diagnosis Date  . Anxiety   . Colon polyp   . COLONIC POLYPS, ADENOMATOUS, HX OF 11/11/2003   Annotation: COLONOSCOPY 10/05 ([facility]) COLONOSCOPY 04/11/07 Qualifier: Diagnosis of  By: Carlean Purl MD, Dimas Millin Diverticulosis   . Gout   . Hx of adenomatous colonic polyps 01/12/2015  . Hyperlipidemia   . Hypertension   . PAF (paroxysmal atrial fibrillation) ([place])    a. diagnosed in 12/2015 b. started on Eliquis for anticoagulation    Past Surgical History:  Procedure Laterality Date  . COLONOSCOPY    . INGUINAL HERNIA REPAIR      Family History  Problem Relation Age of Onset  . Cancer Mother     lung  . Colon cancer Neg Hx     Social History   Social History  . Marital status: Married    Spouse name: N/A  . Number of children: N/A  . Years of education: N/A   Social History Main Topics  . Smoking status: Former Smoker    Quit date: 07/22/1970  . Smokeless tobacco: Never Used     Comment: 50 years ago  . Alcohol use 4.2 oz/week    7 Shots of liquor per week  . Drug use: No  . Sexual activity: Not Asked   Other Topics Concern  . None   Social History Narrative   Married      Current Outpatient Prescriptions:  .  allopurinol (ZYLOPRIM) 100 MG tablet, TAKE 1 TABLET (100 MG TOTAL) BY MOUTH DAILY., Disp: 90 tablet, Rfl: 1 .  atenolol (TENORMIN) 25 MG tablet, 2 tablets every morning and 1 tablet every evening, Disp: 270 tablet, Rfl: 3 .  ibuprofen (ADVIL,MOTRIN) 200 MG tablet, Take 400 mg by mouth every 6 (six) hours as needed for moderate pain., Disp: , Rfl:   EXAM:  Vitals:   03/25/16 1435  BP: 140/82  Pulse: 61  Temp: 97.9 F (36.6 C)    Body mass index is 27.07 kg/m.  GENERAL: vitals reviewed and listed above, alert, oriented, appears well hydrated and in no acute distress  HEENT: atraumatic, conjunttiva clear, no obvious abnormalities on inspection of external nose and ears, on inspection in the mouth at area of concern, poor dental hygiene tooth is missing with gold cap over adjacent tooth, no sig swelling, erythema, pitting of gum in this area, he has some tenderness with pressure here  NECK: no obvious masses or LAD on inspection  MS: moves all extremities without noticeable abnormality  PSYCH: pleasant and cooperative, no obvious depression or anxiety  ASSESSMENT AND PLAN:  Discussed the following assessment and plan:  Tooth pain  -seem has  seen several dentists and advised he follow their advise and discussed risks/benefits of abx -offer abx rx for over the weekend or until Tuesday if he feels strongly about this but he opted to hold off after further discussion -Patient advised to return or notify a doctor immediately if symptoms worsen or persist or new concerns arise.  There are no Patient Instructions on file for this visit.  Travis Pruitt R., DO

## 2016-03-25 NOTE — Telephone Encounter (Signed)
I would advise patient be evaluated. I am full this afternoon. Do we have any spots here available? If not- [facility] has several afternoon openings

## 2016-03-25 NOTE — Telephone Encounter (Addendum)
Pt has an appt on tues to see endodontitis for root canal and would like to have abx. Pt endodontitis is close on Friday and pt believes he has an infection. Pt [facility] the patient said dentist does not have dentist on call

## 2016-03-25 NOTE — Progress Notes (Signed)
Pre visit review using our clinic review tool, if applicable. No additional management support is needed unless otherwise documented below in the visit note. 

## 2016-04-29 ENCOUNTER — Encounter: Payer: Self-pay | Admitting: Cardiology

## 2016-09-09 ENCOUNTER — Ambulatory Visit (INDEPENDENT_AMBULATORY_CARE_PROVIDER_SITE_OTHER): Payer: 59 | Admitting: Family Medicine

## 2016-09-09 ENCOUNTER — Encounter: Payer: Self-pay | Admitting: Family Medicine

## 2016-09-09 VITALS — BP 136/92 | HR 54 | Temp 97.8°F | Ht 69.5 in | Wt 181.2 lb

## 2016-09-09 DIAGNOSIS — G8929 Other chronic pain: Secondary | ICD-10-CM

## 2016-09-09 DIAGNOSIS — M25511 Pain in right shoulder: Secondary | ICD-10-CM | POA: Diagnosis not present

## 2016-09-09 DIAGNOSIS — I10 Essential (primary) hypertension: Secondary | ICD-10-CM | POA: Diagnosis not present

## 2016-09-09 NOTE — Progress Notes (Signed)
Subjective:  Travis Pruitt is a 81 y.o. year old very pleasant male patient who presents for/with See problem oriented charting ROS- no chest pain or shortness of breath. Chronic right shoulder pain- worse with lifting overhead. No hand weakness. No paresthesias.    Past Medical History-  Patient Active Problem List   Diagnosis Date Noted  . Syncope 01/06/2016    Priority: High  . PAF (paroxysmal atrial fibrillation) ([place]) 12/10/2015    Priority: High  . Paroxysmal supraventricular tachycardia ([place]) 12/03/2015    Priority: High  . Multiple rib fractures 11/30/2015    Priority: Medium  . Generalized anxiety disorder 11/27/2007    Priority: Medium  . Hyperlipidemia 10/25/2006    Priority: Medium  . Gout 10/25/2006    Priority: Medium  . Essential hypertension 10/25/2006    Priority: Medium  . Hx of adenomatous colonic polyps 01/12/2015    Priority: Low    Medications- reviewed and updated Current Outpatient Prescriptions  Medication Sig Dispense Refill  . allopurinol (ZYLOPRIM) 100 MG tablet TAKE 1 TABLET (100 MG TOTAL) BY MOUTH DAILY. 90 tablet 1  . atenolol (TENORMIN) 25 MG tablet 2 tablets every morning and 1 tablet every evening 270 tablet 3  . ibuprofen (ADVIL,MOTRIN) 200 MG tablet Take 400 mg by mouth every 6 (six) hours as needed for moderate pain.     No current facility-administered medications for this visit.     Objective: BP (!) 136/92   Pulse (!) 54   Temp 97.8 F (36.6 C) (Oral)   Ht 5' 9.5" (1.765 m)   Wt 181 lb 3.2 oz (82.2 kg)   SpO2 94%   BMI 26.37 kg/m  Gen: NAD, resting comfortably CV: RRR no murmurs rubs or gallops Lungs: CTAB no crackles, wheeze, rhonchi Ext: no edema Skin: warm, dry no rash over shoulder Neuro: normal grip strength in hand  Right Shoulder: Inspection reveals no abnormalities, atrophy or asymmetry. Palpation shows some tenderness in anterior shoulder and also some mild tenderness below right clavicale more proximally.  No pain over AC joint today ROM is full in all planes. Does have some pain with abduction and forward flexion. He does slow down the exam at about 135 degrees abduction which was not noted previously Rotator cuff strength normal throughout.  Last visit did not have any signs of impingement but today has pain with Neer, Hawkin and empty can.     Assessment/Plan: Chronic right shoulder pain - Plan: Ambulatory referral to Sports Medicine S: at least 9 months of pain. Dates back to fall he had October 2017 off 15 foot ladder. February 2018 he reported soreness particularly when he lays on the side. Also had pain with lifting arm overhead.   He saw me 6 months ago and wanted to be conservative in care at first so we tried some home exercises. He did not have relief so he saw Dr. Onnie Graham did injection in January complete relief 1-2 months. He thinks he had x-ray at that time but is not sure. After that pain gradually returned- seems to bother him worst with activity overhead now but still has pain at night. He states pain is moderate only and would not be enough that he would want to have surgery unless strongly suggested. A/P: Patient comes in requesting MRI of shoulder. Since he would not want surgery I suggested perhaps a sports medicine evaluation instead with ultrasound potentially to evaluat ethe problem- still suspect may be rotator cuff related. Suspect likely had x-ray with  Dr. Onnie Graham and no reported fracture- will not repeat unlss suggested by Dr. Paulla Fore  Essential hypertension S: controlled per [facility] on last 2 visits but slightly high today diastolic. He is compliant with atenolol alone- has had orthostatics on higher doses. Checks at pharmacies and has been 120/80 at home BP Readings from Last 3 Encounters:  09/09/16 (!) 136/92  03/25/16 140/82  03/22/16 (!) 148/70  A/P: We discussed blood pressure goal of <150/90 and preferably <140/90. May need to adjust meds at October visits if remains  high     Patient Instructions  We will call you within a week or two about your referral to Dr. Paulla Fore. If you do not hear within 3 weeks, give [place] a call.    Orders Placed This Encounter  Procedures  . Ambulatory referral to Sports Medicine    Referral Priority:   Routine    Referral Type:   Consultation    Referred to Provider:   Gerda Diss, DO    Number of Visits Requested:   1   Return precautions advised.  Garret Reddish, MD

## 2016-09-09 NOTE — Patient Instructions (Signed)
We will call you within a week or two about your referral to Dr. Paulla Fore. If you do not hear within 3 weeks, give [place] a call.

## 2016-09-10 NOTE — Assessment & Plan Note (Signed)
S: controlled per [facility] on last 2 visits but slightly high today diastolic. He is compliant with atenolol alone- has had orthostatics on higher doses. Checks at pharmacies and has been 120/80 at home BP Readings from Last 3 Encounters:  09/09/16 (!) 136/92  03/25/16 140/82  03/22/16 (!) 148/70  A/P: We discussed blood pressure goal of <150/90 and preferably <140/90. May need to adjust meds at October visits if remains high

## 2016-09-26 ENCOUNTER — Ambulatory Visit (INDEPENDENT_AMBULATORY_CARE_PROVIDER_SITE_OTHER): Payer: 59

## 2016-09-26 ENCOUNTER — Other Ambulatory Visit: Payer: Self-pay

## 2016-09-26 ENCOUNTER — Ambulatory Visit (INDEPENDENT_AMBULATORY_CARE_PROVIDER_SITE_OTHER): Payer: 59 | Admitting: Sports Medicine

## 2016-09-26 ENCOUNTER — Encounter: Payer: Self-pay | Admitting: Sports Medicine

## 2016-09-26 VITALS — BP 132/82 | HR 56 | Ht 69.5 in | Wt 182.0 lb

## 2016-09-26 DIAGNOSIS — M25511 Pain in right shoulder: Secondary | ICD-10-CM

## 2016-09-26 DIAGNOSIS — S2241XS Multiple fractures of ribs, right side, sequela: Secondary | ICD-10-CM

## 2016-09-26 DIAGNOSIS — M75101 Unspecified rotator cuff tear or rupture of right shoulder, not specified as traumatic: Secondary | ICD-10-CM | POA: Diagnosis not present

## 2016-09-26 DIAGNOSIS — I7410 Embolism and thrombosis of unspecified parts of aorta: Secondary | ICD-10-CM

## 2016-09-26 DIAGNOSIS — S4361XS Sprain of right sternoclavicular joint, sequela: Secondary | ICD-10-CM | POA: Diagnosis not present

## 2016-09-26 DIAGNOSIS — G2589 Other specified extrapyramidal and movement disorders: Secondary | ICD-10-CM | POA: Insufficient documentation

## 2016-09-26 DIAGNOSIS — G8929 Other chronic pain: Secondary | ICD-10-CM | POA: Diagnosis not present

## 2016-09-26 MED ORDER — NITROGLYCERIN 0.2 MG/HR TD PT24: MEDICATED_PATCH | TRANSDERMAL | 1 refills | Status: DC

## 2016-09-26 MED ORDER — DICLOFENAC SODIUM 1 % TD GEL: 2.0000 g | Freq: Four times a day (QID) | TRANSDERMAL | 1 refills | Status: DC

## 2016-09-26 NOTE — Assessment & Plan Note (Signed)
He does not seem to be significantly symptomatic from these at this time.  No evidence of pneumothorax on x-ray of the shoulder.  Do think given the significant trauma that he experienced after the fall from 15 feet and he does have some underlying [place] joint issues that are likely causing fairly significant scapular dysfunction that exacerbates rotator cuff impingement intermittently.Travis Pruitt

## 2016-09-26 NOTE — Progress Notes (Signed)
OFFICE VISIT NOTE [name]. [place] [place], [place] [facility] [place]  Travis Pruitt - 81 y.o. male MRN [id number]  Date of birth: 06-Jun-1935  Visit Date: 09/26/2016  PCP: Marin Olp, MD   Referred by: Marin Olp, MD  Burlene Arnt, CMA acting as scribe for Dr. Paulla Fore.  SUBJECTIVE:   Chief Complaint  Patient presents with  . New Patient (Initial Visit)    rt shoulder pain   HPI: As below and per problem based documentation when appropriate.  Mr. Tumbleson is a new patient, referred by Dr. Yong Channel, with complaint of rt shoulder pain. Pain start 11/30/15 after falling 15 ft down off of a ladder. He broke 4 ribs and had various other injuries. Pain seems to be located under the rt clavicle. He doesn't have any pain when at rest. He is most aware of the pain when he is in bed and tried to move. Pain is described as throbbing and is rated 7/10 when in bed trying to move around. Pain is worse when reaching up, parallel to the ground, and behind himself. He is rt handed and has been trying to use the left hand more. In 03/2016 he saw Dr. Onnie Graham and was given an injection for the pain, the pain resolved for about 1 month. His last xray was November 2017. He does not use heat or ice. He had taken Advil prn, about every 10 days or so, for the pain and has gotten some relief. No swelling, no radiation of pain.     Review of Systems  Constitutional: Negative for chills and fever.  Respiratory: Negative for shortness of breath and wheezing.   Cardiovascular: Negative for chest pain and palpitations.  Musculoskeletal: Positive for falls.  Neurological: Negative for dizziness, tingling and headaches.  Endo/Heme/Allergies: Does not bruise/bleed easily.    Otherwise per HPI.  HISTORY & PERTINENT PRIOR DATA:  No specialty comments available. He reports that he quit smoking about 46 years ago. He has never used smokeless tobacco. No  results for input(s): HGBA1C, LABURIC in the last 8760 hours. Medications & Allergies reviewed per EMR Patient Active Problem List   Diagnosis Date Noted  . Sternoclavicular (joint) (ligament) sprain, right, sequela 09/26/2016  . Scapular dyskinesis 09/26/2016  . Rotator cuff syndrome of right shoulder 09/26/2016  . Syncope 01/06/2016  . PAF (paroxysmal atrial fibrillation) ([place]) 12/10/2015  . Paroxysmal supraventricular tachycardia ([place]) 12/03/2015  . Multiple rib fractures 11/30/2015  . Hx of adenomatous colonic polyps 01/12/2015  . Generalized anxiety disorder 11/27/2007  . Hyperlipidemia 10/25/2006  . Gout 10/25/2006  . Essential hypertension 10/25/2006   Past Medical History:  Diagnosis Date  . Anxiety   . Colon polyp   . COLONIC POLYPS, ADENOMATOUS, HX OF 11/11/2003   Annotation: COLONOSCOPY 10/05 ([facility]) COLONOSCOPY 04/11/07 Qualifier: Diagnosis of  By: Carlean Purl MD, Dimas Millin Diverticulosis   . Gout   . Hx of adenomatous colonic polyps 01/12/2015  . Hyperlipidemia   . Hypertension   . PAF (paroxysmal atrial fibrillation) ([place])    a. diagnosed in 12/2015 b. started on Eliquis for anticoagulation   Family History  Problem Relation Age of Onset  . Cancer Mother        lung  . Colon cancer Neg Hx    Past Surgical History:  Procedure Laterality Date  . COLONOSCOPY    . INGUINAL HERNIA REPAIR     Social History   Occupational  History  . Not on file.   Social History Main Topics  . Smoking status: Former Smoker    Quit date: 07/22/1970  . Smokeless tobacco: Never Used     Comment: 50 years ago  . Alcohol use 4.2 oz/week    7 Shots of liquor per week  . Drug use: No  . Sexual activity: Not on file    OBJECTIVE:  VS:  HT:5' 9.5" (176.5 cm)   WT:182 lb (82.6 kg)  BMI:26.5    BP:132/82  HR:(!) 56bpm  TEMP: ( )  RESP:96 % EXAM: Findings:  WDWN, NAD, Non-toxic appearing Alert & appropriately interactive Not depressed or anxious appearing No  increased work of breathing. Pupils are equal. EOM intact without nystagmus No clubbing or cyanosis of the extremities appreciated No significant rashes/lesions/ulcerations overlying the examined area. Radial pulses 2+/4. No significant generalized UE edema. Sensation intact to light touch in upper extremities.  Right Shoulder Exam: Normal alignment, Normal Contours No overlying erythema/ecchymosis. No pain or crepitation with axial loading and circumduction TTP over: Anterior shoulder as well as proximal clavicle and inferior [place] joint. No TTP over: Bicipital tendon, pectoralis major muscle.  Internal Rotation: Strength is normal External Rotation: Strength is normal, nonpainful. Empty can: Strength is normal, nonpainful Hawkins: Slight discomfort with [place] Musty: Limited range and pain with terminal Neer's Speeds:Normal O'Brien's: Normal Poor tracking of the right scapula compared to the left with overhead range of motion.      Dg Shoulder Right  Result Date: 09/26/2016 CLINICAL DATA:  Chronic right shoulder pain for 8 months. Initial encounter. EXAM: RIGHT SHOULDER - 2+ VIEW COMPARISON:  12/02/2015 and prior chest radiographs FINDINGS: There is no evidence of fracture or dislocation. There is no evidence of arthropathy or other focal bone abnormality. Soft tissues are unremarkable. IMPRESSION: Negative. Electronically Signed   By: Margarette Canada M.D.   On: 09/26/2016 15:31   ASSESSMENT & PLAN:     ICD-10-CM   1. Chronic right shoulder pain M25.511 DG Shoulder Right   G89.29   2. Sternoclavicular (joint) (ligament) sprain, right, sequela S43.61XS   3. Closed fracture of multiple ribs of right side, sequela S22.41XS   4. Scapular dyskinesis G25.89   5. Rotator cuff syndrome of right shoulder M75.101   ================================================================= Sternoclavicular (joint) (ligament) sprain, right, sequela There is some evidence of abnormality within the [facility]  joint on the CT scan as well as a slight irregularity on the x-ray that is subtle.  And this does seem to be directly over the area that he is most tender from the anterior aspect.  I suspect he did have a grade 1 to grade 2  joint sprain at the time of his fall.  This is likely what is causing him to continue to have symptoms.  Topical Voltaren up to 4 times per day as needed.  Will also begin with scapular stabilization exercises.    Scapular dyskinesis Poor scapular tracking on exam.  Scapular stabilization exercises provided today as well as intrinsic shoulder strengthening exercises.  Rotator cuff syndrome of right shoulder Patient has already undergone a sub-acromial injection with Dr. supple he responded quite well to this but it was short-lived.  He has not been performing the home therapeutic exercises he was provided.  Is emphasized the importance of beginning back on these as well as discussed the utilities of nitro glycerin therapy as a second line treatment for likely underlying rotator cuff tendinopathy.  I do not suspect a significant rotator cuff  tear given his overall strength.    We will plan for [facility] ultrasound at follow-up of the right shoulder/rotator cuff.  Multiple rib fractures He does not seem to be significantly symptomatic from these at this time.  No evidence of pneumothorax on x-ray of the shoulder.  Do think given the significant trauma that he experienced after the fall from 15 feet and he does have some underlying [place] joint issues that are likely causing fairly significant scapular dysfunction that exacerbates rotator cuff impingement intermittently.. ================================================================= Patient Instructions  It was nice to meet you.  I believe you have a [place] separation as well as some underlying rotator cuff tendinosis.  I would like for you to use a quarter of a patch of nitroglycerin over the anterior lateral shoulder.  Apply this patch  once daily.  Change the quarter of a patch the next day.  Please perform the exercises we provided you on a daily basis.  10-15 minutes at minimum would be ideal.  Follow-up in 4 weeks and we will consider further imaging if you are having persistent symptoms.   =================================================================  Follow-up: Return in about 4 weeks (around 10/24/2016).   CMA/ATC served as Education administrator during this visit. History, Physical, and Plan performed by medical provider. Documentation and orders reviewed and attested to.      Teresa Coombs, [place] [facility] Physician

## 2016-09-26 NOTE — Assessment & Plan Note (Signed)
Poor scapular tracking on exam.  Scapular stabilization exercises provided today as well as intrinsic shoulder strengthening exercises.

## 2016-09-26 NOTE — Patient Instructions (Signed)
It was nice to meet you.  I believe you have a [place] separation as well as some underlying rotator cuff tendinosis.  I would like for you to use a quarter of a patch of nitroglycerin over the anterior lateral shoulder.  Apply this patch once daily.  Change the quarter of a patch the next day.  Please perform the exercises we provided you on a daily basis.  10-15 minutes at minimum would be ideal.  Follow-up in 4 weeks and we will consider further imaging if you are having persistent symptoms.  Nitroglycerin Protocol   Apply 1/4 nitroglycerin patch to affected area daily.  Change position of patch within the affected area every 24 hours.  You may experience a headache during the first 1-2 weeks of using the patch, these should subside.  If you experience headaches after beginning nitroglycerin patch treatment, you may take your preferred over the counter pain reliever.  Another side effect of the nitroglycerin patch is skin irritation or rash related to patch adhesive.  Please notify our office if you develop more severe headaches or rash, and stop the patch.  Tendon healing with nitroglycerin patch may require 12 to 24 weeks depending on the extent of injury.  Men should not use if taking Viagra, Cialis, or Levitra.   Do not use if you have migraines or rosacea.

## 2016-09-26 NOTE — Assessment & Plan Note (Signed)
Patient has already undergone a sub-acromial injection with Dr. supple he responded quite well to this but it was short-lived.  He has not been performing the home therapeutic exercises he was provided.  Is emphasized the importance of beginning back on these as well as discussed the utilities of nitro glycerin therapy as a second line treatment for likely underlying rotator cuff tendinopathy.  I do not suspect a significant rotator cuff tear given his overall strength.    We will plan for [facility] ultrasound at follow-up of the right shoulder/rotator cuff.

## 2016-09-26 NOTE — Assessment & Plan Note (Signed)
There is some evidence of abnormality within the [facility] joint on the CT scan as well as a slight irregularity on the x-ray that is subtle.  And this does seem to be directly over the area that he is most tender from the anterior aspect.  I suspect he did have a grade 1 to grade 2 [place] joint sprain at the time of his fall.  This is likely what is causing him to continue to have symptoms.  Topical Voltaren up to 4 times per day as needed.  Will also begin with scapular stabilization exercises.

## 2016-09-27 ENCOUNTER — Other Ambulatory Visit: Payer: Self-pay

## 2016-09-27 MED ORDER — NITROGLYCERIN 0.2 MG/HR TD PT24: MEDICATED_PATCH | TRANSDERMAL | 1 refills | Status: DC

## 2016-09-28 ENCOUNTER — Other Ambulatory Visit: Payer: Self-pay

## 2016-09-28 MED ORDER — VOLTAREN 1 % TD GEL: 2.0000 g | Freq: Four times a day (QID) | TRANSDERMAL | 1 refills | Status: DC

## 2016-10-31 ENCOUNTER — Encounter: Payer: Self-pay | Admitting: Sports Medicine

## 2016-10-31 ENCOUNTER — Ambulatory Visit (INDEPENDENT_AMBULATORY_CARE_PROVIDER_SITE_OTHER): Payer: 59 | Admitting: Sports Medicine

## 2016-10-31 VITALS — BP 130/74 | HR 52 | Wt 183.2 lb

## 2016-10-31 DIAGNOSIS — S2241XS Multiple fractures of ribs, right side, sequela: Secondary | ICD-10-CM

## 2016-10-31 DIAGNOSIS — S4361XS Sprain of right sternoclavicular joint, sequela: Secondary | ICD-10-CM

## 2016-10-31 DIAGNOSIS — G8929 Other chronic pain: Secondary | ICD-10-CM

## 2016-10-31 DIAGNOSIS — M75101 Unspecified rotator cuff tear or rupture of right shoulder, not specified as traumatic: Secondary | ICD-10-CM | POA: Diagnosis not present

## 2016-10-31 DIAGNOSIS — M25511 Pain in right shoulder: Secondary | ICD-10-CM

## 2016-10-31 DIAGNOSIS — I7410 Embolism and thrombosis of unspecified parts of aorta: Secondary | ICD-10-CM

## 2016-10-31 NOTE — Progress Notes (Signed)
OFFICE VISIT NOTE Travis Pruitt, [place] [facility]  Travis Pruitt - 81 y.o. male MRN [id number]  Date of birth: [birth year]  Visit Date: 10/31/2016  PCP: Travis Olp, MD   Referred by: Travis Olp, MD  Thalia Bloodgood PT, LAT, ATC  acting as scribe for Dr. Paulla Fore.  SUBJECTIVE:   Chief Complaint  Patient presents with  . Follow-up    R shoulder pain   HPI: As below and per problem based documentation when appropriate.  Pt is here for a f/u of his R shoulder.  Pt states that his R shoulder is about the same except that it is now more localized.  Pt locates the pain to the superior aspect of the R humerus.  He states that he used a Film/video editor about 3 times which decreased the pain in the R chest and then led to the localized shoulder pain.  He notes that he did not use the Volataren or the nitroglycerin patches due to the success he had w/ the mechanical massager and would prefer to treat it w/o medication.  Pt reports irritating factors as reaching, horiz aBd and sometimes turning his steering wheel.  Pt notes that he occasionally will take Advil for pain when it is particularly bothering him.  He also denies having done his HEP shown to him at his last visit.      Review of Systems  Constitutional: Negative for chills, fever and weight loss.  HENT: Negative.   Eyes: Negative.   Respiratory: Negative for cough, shortness of breath and wheezing.   Cardiovascular: Negative for chest pain and palpitations.  Gastrointestinal: Negative for heartburn, nausea and vomiting.  Genitourinary: Negative.   Musculoskeletal: Positive for joint pain. Negative for back pain, falls and neck pain.  Skin: Negative.   Neurological: Negative for dizziness, tingling and headaches.  Endo/Heme/Allergies: Does not bruise/bleed easily.  Psychiatric/Behavioral: Negative for depression. The patient is not  nervous/anxious and does not have insomnia.     Otherwise per HPI.  HISTORY & PERTINENT PRIOR DATA:  No specialty comments available. He reports that he quit smoking about 46 years ago. He has never used smokeless tobacco. No results for input(s): HGBA1C, LABURIC in the last 8760 hours. Allergies reviewed per EMR Prior to Admission medications   Medication Sig Start Date End Date Taking? Authorizing Provider  allopurinol (ZYLOPRIM) 100 MG tablet TAKE 1 TABLET (100 MG TOTAL) BY MOUTH DAILY. 12/31/15  Yes Travis Olp, MD  atenolol (TENORMIN) 25 MG tablet 2 tablets every morning and 1 tablet every evening 02/05/16  Yes Crenshaw, Denice Bors, MD  ibuprofen (ADVIL,MOTRIN) 200 MG tablet Take 400 mg by mouth every 6 (six) hours as needed for moderate pain.   Yes [provider]  atenolol (TENORMIN) 50 MG tablet TAKE 1 TABLET (50 MG TOTAL) BY MOUTH 2 (TWO) TIMES DAILY. 11/08/16   Travis Olp, MD  nitroGLYCERIN (NITRODUR - DOSED IN MG/24 HR) 0.2 mg/hr patch Place 1/4 to 1/2 of a patch over affected region. Remove and replace once daily.  Slightly alter skin placement daily 11/01/16   Gerda Diss, DO   Patient Active Problem List   Diagnosis Date Noted  . Sternoclavicular (joint) (ligament) sprain, right, sequela 09/26/2016  . Scapular dyskinesis 09/26/2016  . Rotator cuff syndrome of right shoulder 09/26/2016  . Syncope 01/06/2016  . PAF (paroxysmal atrial fibrillation) ([place]) 12/10/2015  . Paroxysmal supraventricular tachycardia ([place])  12/03/2015  . Multiple rib fractures 11/30/2015  . Hx of adenomatous colonic polyps 01/12/2015  . Generalized anxiety disorder 11/27/2007  . Hyperlipidemia 10/25/2006  . Gout 10/25/2006  . Essential hypertension 10/25/2006   Past Medical History:  Diagnosis Date  . Anxiety   . Colon polyp   . COLONIC POLYPS, ADENOMATOUS, HX OF 11/11/2003   Annotation: COLONOSCOPY 10/05 ([facility]) COLONOSCOPY 04/11/07 Qualifier: Diagnosis of  By: Carlean Purl MD,  Dimas Millin Diverticulosis   . Gout   . Hx of adenomatous colonic polyps 01/12/2015  . Hyperlipidemia   . Hypertension   . PAF (paroxysmal atrial fibrillation) ([place])    a. diagnosed in 12/2015 b. started on Eliquis for anticoagulation   Family History  Problem Relation Age of Onset  . Cancer Mother        lung  . Colon cancer Neg Hx    Past Surgical History:  Procedure Laterality Date  . COLONOSCOPY    . INGUINAL HERNIA REPAIR     Social History   Occupational History  . Not on file.   Social History Main Topics  . Smoking status: Former Smoker    Quit date: 07/22/1970  . Smokeless tobacco: Never Used     Comment: 50 years ago  . Alcohol use 4.2 oz/week    7 Shots of liquor per week  . Drug use: No  . Sexual activity: Not on file    OBJECTIVE:  VS:  HT:    WT:183 lb 3.2 oz (83.1 kg)  BMI:     BP:130/74  HR:(!) 52bpm  TEMP: ( )  RESP:96 % EXAM: Findings:  Adult male.  No acute distress.  Alert and appropriate.  He is able to move his shoulder above his head as well as into full internal rotation.  He has pain with empty can testing and Spurling's compression test but this is minimal.  No significant AC joint tenderness for [place] joint tenderness.  He has palpable rib fractures that are well-healed and nontender along the right superior/ posterior ribs.    RADIOLOGY: DG Shoulder Right CLINICAL DATA:  Chronic right shoulder pain for 8 months. Initial encounter.  EXAM: RIGHT SHOULDER - 2+ VIEW  COMPARISON:  12/02/2015 and prior chest radiographs  FINDINGS: There is no evidence of fracture or dislocation. There is no evidence of arthropathy or other focal bone abnormality. Soft tissues are unremarkable.  IMPRESSION: Negative.  Electronically Signed   By: Margarette Canada M.D.   On: 09/26/2016 15:31  ASSESSMENT & PLAN:     ICD-10-CM   1. Chronic right shoulder pain M25.511    G89.29   2. Closed fracture of multiple ribs of right side, sequela S22.41XS    3. Sternoclavicular (joint) (ligament) sprain, right, sequela S43.61XS   4. Rotator cuff syndrome of right shoulder M75.101    ================================================================= Sternoclavicular (joint) (ligament) sprain, right, sequela This is subsequently significantly improved following treatment with massage.  He reports feeling a pop and I suspect this has caused improvement in a possible subluxation.  No concerns for potential posterior dislocation.  Overall feeling significantly improved.  Rotator cuff syndrome of right shoulder This seems to be more of his symptoms at this time.  He did not performed prior therapeutic exercises and this was reviewed with him today again emphasized the importance of what we are trying to address with this.  We will plan to have him work on daily exercises as outlined per AVS.  Multiple rib fractures He has  a small amount of pain once again this is significantly improved with his self massage and reports overall good improvement with this.  Major issue today is the rotator cuff.  PROCEDURE NOTE: THERAPEUTIC EXERCISES (97110) 15 minutes spent for Therapeutic exercises as below and as referenced in the AVS. This included exercises focusing on stretching, strengthening, with significant focus on eccentric aspects.  Proper technique shown and discussed handout in great detail with ATC. All questions were discussed and answered.   Long term goals include an improvement in range of motion, strength, endurance as well as avoiding reinjury. Frequency of visits is one time as determined during today's  office visit. Frequency of exercises to be performed is as per handout.  EXERCISES REVIEWED:  Per handout  ================================================================= Patient Instructions  Please perform the exercise program that we have prepared for you and gone over in detail on a daily basis.  In addition to the handout you were  provided you can access your program through: [email or web address]   Your unique program code is: [email or web address]   =================================================================   Follow-up: Return in about 8 weeks (around 12/26/2016).   CMA/ATC served as Education administrator during this visit. History, Physical, and Plan performed by medical provider. Documentation and orders reviewed and attested to.      Teresa Coombs, [facility] Physician

## 2016-10-31 NOTE — Patient Instructions (Signed)
Please perform the exercise program that we have prepared for you and gone over in detail on a daily basis.  In addition to the handout you were provided you can access your program through: [email or web address]   Your unique program code is: [phone]

## 2016-11-01 MED ORDER — NITROGLYCERIN 0.2 MG/HR TD PT24: MEDICATED_PATCH | TRANSDERMAL | 1 refills | Status: DC

## 2016-11-05 ENCOUNTER — Other Ambulatory Visit: Payer: Self-pay | Admitting: Family Medicine

## 2016-11-23 NOTE — Assessment & Plan Note (Signed)
He has a small amount of pain once again this is significantly improved with his self massage and reports overall good improvement with this.  Major issue today is the rotator cuff.

## 2016-11-23 NOTE — Assessment & Plan Note (Signed)
This is subsequently significantly improved following treatment with massage.  He reports feeling a pop and I suspect this has caused improvement in a possible subluxation.  No concerns for potential posterior dislocation.  Overall feeling significantly improved.

## 2016-11-23 NOTE — Assessment & Plan Note (Signed)
This seems to be more of his symptoms at this time.  He did not performed prior therapeutic exercises and this was reviewed with him today again emphasized the importance of what we are trying to address with this.  We will plan to have him work on daily exercises as outlined per AVS.

## 2016-11-30 ENCOUNTER — Encounter: Payer: 59 | Admitting: Family Medicine

## 2016-12-01 ENCOUNTER — Encounter: Payer: Self-pay | Admitting: Family Medicine

## 2016-12-01 ENCOUNTER — Ambulatory Visit (INDEPENDENT_AMBULATORY_CARE_PROVIDER_SITE_OTHER): Payer: 59 | Admitting: Family Medicine

## 2016-12-01 VITALS — BP 140/88 | HR 60 | Temp 98.1°F | Ht 69.5 in | Wt 180.8 lb

## 2016-12-01 DIAGNOSIS — I10 Essential (primary) hypertension: Secondary | ICD-10-CM

## 2016-12-01 DIAGNOSIS — Z23 Encounter for immunization: Secondary | ICD-10-CM

## 2016-12-01 DIAGNOSIS — I480 Paroxysmal atrial fibrillation: Secondary | ICD-10-CM

## 2016-12-01 DIAGNOSIS — M1A9XX0 Chronic gout, unspecified, without tophus (tophi): Secondary | ICD-10-CM | POA: Diagnosis not present

## 2016-12-01 DIAGNOSIS — I7410 Embolism and thrombosis of unspecified parts of aorta: Secondary | ICD-10-CM

## 2016-12-01 DIAGNOSIS — I700 Atherosclerosis of aorta: Secondary | ICD-10-CM | POA: Diagnosis not present

## 2016-12-01 DIAGNOSIS — E785 Hyperlipidemia, unspecified: Secondary | ICD-10-CM

## 2016-12-01 MED ORDER — ATENOLOL 50 MG PO TABS: ORAL_TABLET | ORAL | 3 refills | Status: DC

## 2016-12-01 MED ORDER — AMLODIPINE BESYLATE 2.5 MG PO TABS: 2.5000 mg | ORAL_TABLET | Freq: Every day | ORAL | 5 refills | Status: DC

## 2016-12-01 NOTE — Patient Instructions (Addendum)
Add amlodipine 2.5mg  to atenolol 50mg  BID. Have Dr. Nicolasa Ducking nurse let me know what your blood pressure is when you come in 12/26/16.   Schedule a lab visit at the check out desk within 2 weeks. Return for future fasting labs meaning nothing but water after midnight please. Ok to take your medications with water.   Glad you are doing so well!

## 2016-12-01 NOTE — Assessment & Plan Note (Signed)
Noted on CT abd/pelvis. We discussed risk factor modification- lipids and BP

## 2016-12-01 NOTE — Progress Notes (Signed)
Subjective:  Travis Pruitt is a 81 y.o. year old very pleasant male patient who presents for/with See problem oriented charting ROS- No chest pain or shortness of breath. No headache or blurry vision. Still having shoulder pain   Past Medical History-  Patient Active Problem List   Diagnosis Date Noted  . Syncope 01/06/2016    Priority: High  . PAF (paroxysmal atrial fibrillation) ([place]) 12/10/2015    Priority: High  . History of paroxysmal supraventricular tachycardia 12/03/2015    Priority: High  . Multiple rib fractures 11/30/2015    Priority: Medium  . Hyperlipidemia 10/25/2006    Priority: Medium  . Gout 10/25/2006    Priority: Medium  . Essential hypertension 10/25/2006    Priority: Medium  . Hx of adenomatous colonic polyps 01/12/2015    Priority: Low  . Generalized anxiety disorder 11/27/2007    Priority: Low  . Aortic atherosclerosis ([place]) 12/01/2016  . Sternoclavicular (joint) (ligament) sprain, right, sequela 09/26/2016  . Scapular dyskinesis 09/26/2016  . Rotator cuff syndrome of right shoulder 09/26/2016    Medications- reviewed and updated Current Outpatient Prescriptions  Medication Sig Dispense Refill  . allopurinol (ZYLOPRIM) 100 MG tablet TAKE 1 TABLET (100 MG TOTAL) BY MOUTH DAILY. 90 tablet 1  . atenolol (TENORMIN) 50 MG tablet TAKE 1 TABLET (50 MG TOTAL) BY MOUTH 2 (TWO) TIMES DAILY. 180 tablet 3  . ibuprofen (ADVIL,MOTRIN) 200 MG tablet Take 400 mg by mouth every 6 (six) hours as needed for moderate pain.    . nitroGLYCERIN (NITRODUR - DOSED IN MG/24 HR) 0.2 mg/hr patch Place 1/4 to 1/2 of a patch over affected region. Remove and replace once daily.  Slightly alter skin placement daily 30 patch 1  . amLODipine (NORVASC) 2.5 MG tablet Take 1 tablet (2.5 mg total) by mouth daily. 30 tablet 5   No current facility-administered medications for this visit.     Objective: BP 140/88 (BP Location: Left Arm, Patient Position: Sitting, Cuff Size: Large)    Pulse 60   Temp 98.1 F (36.7 C) (Oral)   Ht 5' 9.5" (1.765 m)   Wt 180 lb 12.8 oz (82 kg)   SpO2 96%   BMI 26.32 kg/m  Gen: NAD, resting comfortably CV: RRR no murmurs rubs or gallops Lungs: CTAB no crackles, wheeze, rhonchi Abdomen: soft/nontender/nondistended/normal bowel sounds. No rebound or guarding. raesonable weight for age Ext: no edema, 2+ PT pulses Skin: warm, dry Neuro: grossly normal, moves all extremities Msk: able to lift both arms above head with difficulty  Assessment/Plan:  Right shoulder pain improving under care of Dr. Paulla Fore  HM  Colon cancer screening- colonoscopy12/6/16- did have 2 adenomas but Dr. Carlean Purl stated no recall due to age.  Prostate cancer screening- passed age based screening Former smoker- quit 1972 so not candidate for lung cancer screening. AAA screen essentially done by CT abd/pelvis.   Gout S: No flare ups of gout on allopurinol 100mg  A/P: continue current medication, update uric acid  PAF (paroxysmal atrial fibrillation) ([facility]) S: HR controlled on atenolol 50mg  BID. He has opted out of anticoagulation. Denies palpitations- no clear recurrence of a fib A/P: continue current medication. Knows if recurrent a. Fib would need to be on anticoagulation   Essential hypertension S: controlled  Poorly today. Home readings up to 150s. My repeat was 073 systolic BP Readings from Last 3 Encounters:  12/01/16 140/88  10/31/16 130/74  09/26/16 132/82  A/P: We discussed blood pressure goal of <140/90. Continue current meds:  Add amlodipine 2.5mg  to atenolol 50mg  BID. Has visit with dr. Paulla Fore next few weeks- asked him to let them let me know BP that day  Hyperlipidemia S: reasonably controlled on last check. No myalgias.  Lab Results  Component Value Date   CHOL 177 11/24/2015   HDL 49.90 11/24/2015   LDLCALC 98 11/24/2015   TRIG 142.0 11/24/2015   CHOLHDL 4 11/24/2015   A/P: if LDL under 100 would not start statin, 2015 this was 116  though- will monitor   Aortic atherosclerosis ([facility]) Noted on CT abd/pelvis. We discussed risk factor modification- lipids and BP   Future Appointments Date Time Provider [place]  12/26/2016 11:20 AM Gerda Diss, DO [facility] None  01/09/2017 3:45 PM Carlean Purl Ofilia Neas, MD [facility]  wants to discuss hemorrhoids with Dr. Carlean Purl  6 month check in  Orders Placed This Encounter  Procedures  . Flu vaccine HIGH DOSE PF  . CBC    Standing Status:   Future    Standing Expiration Date:   12/01/2017  . Comprehensive metabolic panel    [place]    Standing Status:   Future    Standing Expiration Date:   12/01/2017  . Lipid panel    Standing Status:   Future    Standing Expiration Date:   12/01/2017  . Uric acid    Standing Status:   Future    Standing Expiration Date:   12/01/2017  . POCT Urinalysis Dipstick (Automated)    Standing Status:   Future    Standing Expiration Date:   01/31/2017    Meds ordered this encounter  Medications  . amLODipine (NORVASC) 2.5 MG tablet    Sig: Take 1 tablet (2.5 mg total) by mouth daily.    Dispense:  30 tablet    Refill:  5  . atenolol (TENORMIN) 50 MG tablet    Sig: TAKE 1 TABLET (50 MG TOTAL) BY MOUTH 2 (TWO) TIMES DAILY.    Dispense:  180 tablet    Refill:  3    Return precautions advised.  Garret Reddish, MD

## 2016-12-01 NOTE — Assessment & Plan Note (Signed)
S: reasonably controlled on last check. No myalgias.  Lab Results  Component Value Date   CHOL 177 11/24/2015   HDL 49.90 11/24/2015   LDLCALC 98 11/24/2015   TRIG 142.0 11/24/2015   CHOLHDL 4 11/24/2015   A/P: if LDL under 100 would not start statin, 2015 this was 116 though- will monitor

## 2016-12-01 NOTE — Assessment & Plan Note (Signed)
S: No flare ups of gout on allopurinol 100mg  A/P: continue current medication, update uric acid

## 2016-12-01 NOTE — Assessment & Plan Note (Signed)
S: controlled  Poorly today. Home readings up to 150s. My repeat was 696 systolic BP Readings from Last 3 Encounters:  12/01/16 140/88  10/31/16 130/74  09/26/16 132/82  A/P: We discussed blood pressure goal of <140/90. Continue current meds:  Add amlodipine 2.5mg  to atenolol 50mg  BID. Has visit with dr. Paulla Fore next few weeks- asked him to let them let me know BP that day

## 2016-12-01 NOTE — Assessment & Plan Note (Signed)
S: HR controlled on atenolol 50mg  BID. He has opted out of anticoagulation. Denies palpitations- no clear recurrence of a fib A/P: continue current medication. Knows if recurrent a. Fib would need to be on anticoagulation

## 2016-12-26 ENCOUNTER — Encounter: Payer: Self-pay | Admitting: Sports Medicine

## 2016-12-26 ENCOUNTER — Ambulatory Visit (INDEPENDENT_AMBULATORY_CARE_PROVIDER_SITE_OTHER): Payer: 59 | Admitting: Sports Medicine

## 2016-12-26 VITALS — BP 152/82 | HR 58 | Ht 69.5 in | Wt 179.8 lb

## 2016-12-26 DIAGNOSIS — S4361XS Sprain of right sternoclavicular joint, sequela: Secondary | ICD-10-CM

## 2016-12-26 DIAGNOSIS — M25511 Pain in right shoulder: Secondary | ICD-10-CM

## 2016-12-26 DIAGNOSIS — M75101 Unspecified rotator cuff tear or rupture of right shoulder, not specified as traumatic: Secondary | ICD-10-CM | POA: Diagnosis not present

## 2016-12-26 DIAGNOSIS — I7410 Embolism and thrombosis of unspecified parts of aorta: Secondary | ICD-10-CM

## 2016-12-26 DIAGNOSIS — G2589 Other specified extrapyramidal and movement disorders: Secondary | ICD-10-CM

## 2016-12-26 DIAGNOSIS — G8929 Other chronic pain: Secondary | ICD-10-CM

## 2016-12-26 NOTE — Progress Notes (Signed)
Travis Pruitt. [name], Travis Pruitt  ITHIEL LIEBLER - 81 y.o. male MRN [id number]  Date of birth: May 08, 1935   Scribe for today's visit: [name], ATC    SUBJECTIVE:  Travis Pruitt is here for Follow-up (R shoulder pain) .   With complaint of rt shoulder pain. Pain start 11/30/15 after falling 15 ft down off of a ladder. He broke 4 ribs and had various other injuries. Pain initially was located under the rt clavicle this improved following self massage the last office visit that caused a audible/palpable cavitation of the [place] joint. He doesn't have any pain when at rest. Pain is worse when reaching up, parallel to the ground, and behind himself. He is rt handed and has been trying to use the left hand more. In 03/2016 he saw Dr. Onnie Graham and was given an injection for the pain, the pain resolved for about 1 month. His last xray was November 2017. He does not use heat or ice. He had taken Advil prn, about every 10 days or so, for the pain and has gotten some relief. No swelling, no radiation of pain.   Pt was last seen on 10/31/16. Compared to the last office visit, his previously described symptoms are improving steadily.  He notices that he is able to turn the steering well without symptoms. Current symptoms are mild & are nonradiating. He has been using his nitroglycerine patches and has been doing his HEP 3-4x/week.  He states that he feels about 50-60% improved.  He does notice if he misses a day of using the patch that he does have pain at night.  Overall happy with his progress    ROS Reports night time disturbances. Denies fevers, chills, or night sweats. Denies unexplained weight loss. Reports personal history of cancer. Denies changes in bowel or bladder habits. Denies recent unreported falls. Denies new or worsening dyspnea or wheezing. Denies headaches or dizziness.  Denies numbness, tingling or  weakness  In the extremities.  Denies dizziness or presyncopal episodes Denies lower extremity edema    HISTORY & PERTINENT PRIOR DATA:  Prior History reviewed and updated per electronic medical record. Significant history, findings, studies and interim changes include: No additional findings.  reports that he quit smoking about 46 years ago. he has never used smokeless tobacco. No results for input(s): HGBA1C, LABURIC, CREATINE in the last 8760 hours. No problems updated.   OBJECTIVE:  VS:  HT:5' 9.5" (176.5 cm)   WT:179 lb 12.8 oz (81.6 kg)  BMI:26.18    BP:(!) 152/82  HR:(!) 58bpm  TEMP: ( )  RESP:100 %  PHYSICAL EXAM: Constitutional: WDWN, Non-toxic appearing. Psychiatric: Alert & appropriately interactive.Not depressed or anxious appearing. Respiratory: No increased work of breathing. Trachea Midline Eyes: Pupils are equal. EOM intact without nystagmus. No scleral icterus  Bilateral upper extremity NEUROVASCULAR exam: No clubbing or cyanosis appreciated Capillary Refill: normal, less than 2 seconds  Radial Pulses: symmetrically palpable  Sensation in examined extremities: Intact to light touch in all dermatomes  Right Shoulder Exam: Normal alignment & Contours [place] joint is normal appearing Skin: No overlying erythema/ecchymosis Axial loading and circumduction produces: No pain or crepitation Non tender over: AC joint, [place] joint, clavicle, posterior shoulder, anterior shoulder Drop arm test: negative [place]: normal, no pain  Neers: normal, no pain  Internal Rotation: full range without pain. Strength: normal External Rotation: Small amount of restriction with external rotation but this is minimal,  full range without pain. Strength: 4+, with slight pain Empty can: normal, no pain Strength: normal Speeds: normal, no pain Strength: normal [place]: normal, no pain Strength: normal    ASSESSMENT & PLAN:   1. Chronic right shoulder pain   2. Sternoclavicular (joint)  (ligament) sprain, right, sequela   3. Rotator cuff syndrome of right shoulder   4. Scapular dyskinesis    Plan: Overall significantly improved.  We will have him continue with half a patch of nitroglycerin change daily as well as daily therapeutic exercises.    We will plan to re-ultrasound him in 6-8 weeks as I anticipate good clinical improvement but at this time given the fact he still having pain no change management indicated at this time.  >50% of this 25 minute visit spent in direct patient counseling and/or coordination of care.  Discussion was focused on education regarding the in discussing the pathoetiology and anticipated clinical course of the above condition.  No problem-specific Assessment & Plan notes found for this encounter.  ++++++++++++++++++++++++++++++++++++++++++++ Follow-up: Return in about 8 weeks (around 02/20/2017).   Pertinent documentation may be included in additional procedure notes, imaging studies, problem based documentation and patient instructions. Please see these sections of the encounter for additional information regarding this visit. CMA/ATC served as Education administrator during this visit. History, Physical, and Plan performed by medical provider. Documentation and orders reviewed and attested to.      [name] Physician    12/26/2016 11:40 AM

## 2016-12-27 ENCOUNTER — Telehealth: Payer: Self-pay | Admitting: Family Medicine

## 2016-12-27 ENCOUNTER — Telehealth: Payer: Self-pay

## 2016-12-27 NOTE — Telephone Encounter (Signed)
After taking the time to do PA, it turns out PA is not required for this medication.  Notified pharmacy that PA is not required.

## 2016-12-27 NOTE — Telephone Encounter (Signed)
PA for Atenolol initiated through Cover My Meds.

## 2016-12-27 NOTE — Telephone Encounter (Signed)
I have performed prior authorizations for both medications for 90 day supplies.  Both medications are covered by patient's insurance for 90 day supplies.  Prior authorizations have not been canceled by anyone.  Cover My Meds keys are:  [id number] and [id number].  I have advised the pharmacy that if they are having problems filling these medications, they will need to contact the patient's insurance company themselves.  There is nothing more I can do.  I have done the prior authorizations (which weren't needed).  I cannot do anything else to fill these medications, it is up to the pharmacy now.

## 2016-12-27 NOTE — Telephone Encounter (Signed)
Copied from [place] [id number]. Topic: Quick Communication - See Telephone Encounter >> Dec 27, 2016  2:28 PM Ether Griffins B wrote: CRM for notification. See Telephone encounter for:  Pt wanting a 90 day supply of amlodipine and atenolol. Insurance is requiring a prior auth for the 90 day supply. When [employer] is running the scripts through it is saying max 30 days and that the physician canceled the PA's request  12/27/16.

## 2016-12-29 ENCOUNTER — Other Ambulatory Visit (INDEPENDENT_AMBULATORY_CARE_PROVIDER_SITE_OTHER): Payer: 59

## 2016-12-29 DIAGNOSIS — I10 Essential (primary) hypertension: Secondary | ICD-10-CM

## 2016-12-29 DIAGNOSIS — M1A9XX0 Chronic gout, unspecified, without tophus (tophi): Secondary | ICD-10-CM | POA: Diagnosis not present

## 2016-12-29 DIAGNOSIS — E785 Hyperlipidemia, unspecified: Secondary | ICD-10-CM

## 2016-12-29 LAB — CBC
HCT: 49.8 % (ref 39.0–52.0)
Hemoglobin: 16.7 g/dL (ref 13.0–17.0)
MCHC: 33.6 g/dL (ref 30.0–36.0)
MCV: 96.5 fl (ref 78.0–100.0)
PLATELETS: 177 10*3/uL (ref 150.0–400.0)
RBC: 5.16 Mil/uL (ref 4.22–5.81)
RDW: 13 % (ref 11.5–15.5)
WBC: 5.3 10*3/uL (ref 4.0–10.5)

## 2016-12-29 LAB — POC URINALSYSI DIPSTICK (AUTOMATED)
Bilirubin, UA: NEGATIVE
Blood, UA: NEGATIVE
GLUCOSE UA: NEGATIVE
Ketones, UA: NEGATIVE
Leukocytes, UA: NEGATIVE
NITRITE UA: NEGATIVE
Protein, UA: NEGATIVE
Spec Grav, UA: >=1.03 — AB (ref 1.010–1.025)
UROBILINOGEN UA: 0.2 U/dL
pH, UA: 6 (ref 5.0–8.0)

## 2016-12-29 LAB — COMPREHENSIVE METABOLIC PANEL
ALT: 12 U/L (ref 0–53)
AST: 14 U/L (ref 0–37)
Albumin: 3.8 g/dL (ref 3.5–5.2)
Alkaline Phosphatase: 44 U/L (ref 39–117)
BILIRUBIN TOTAL: 0.7 mg/dL (ref 0.2–1.2)
BUN: 21 mg/dL (ref 6–23)
CO2: 32 meq/L (ref 19–32)
Calcium: 9.3 mg/dL (ref 8.4–10.5)
Chloride: 103 mEq/L (ref 96–112)
Creatinine, Ser: 0.98 mg/dL (ref 0.40–1.50)
GFR: 78.43 mL/min (ref >60.00)
GLUCOSE: 106 mg/dL — AB (ref 70–99)
Potassium: 4.5 mEq/L (ref 3.5–5.1)
Sodium: 139 mEq/L (ref 135–145)
Total Protein: 6.3 g/dL (ref 6.0–8.3)

## 2016-12-29 LAB — LIPID PANEL
CHOLESTEROL: 166 mg/dL (ref 0–200)
HDL: 51.6 mg/dL (ref >39.00)
LDL CALC: 93 mg/dL (ref 0–99)
NonHDL: 114.87
TRIGLYCERIDES: 108 mg/dL (ref 0.0–149.0)
Total CHOL/HDL Ratio: 3
VLDL: 21.6 mg/dL (ref 0.0–40.0)

## 2016-12-29 LAB — URIC ACID: Uric Acid, Serum: 6 mg/dL (ref 4.0–7.8)

## 2017-01-09 ENCOUNTER — Ambulatory Visit: Payer: 59 | Admitting: Internal Medicine

## 2017-01-13 ENCOUNTER — Telehealth: Payer: Self-pay | Admitting: Family Medicine

## 2017-01-13 NOTE — Telephone Encounter (Signed)
Pt calling stating he was told to take over the counter Vit D supplement but he does not remember the strength. Pt states he mad a mistake and threw the bottle away. Pt asking if a return call could be given with the dosage he should be taking. Pt states he can be reached at [phone] and is ok if a message is left on voicemail.

## 2017-01-13 NOTE — Telephone Encounter (Signed)
May advise him 800 IU a day- that is safe for most patients

## 2017-01-13 NOTE — Telephone Encounter (Signed)
Please call patient to advise on medication dosage.

## 2017-01-16 NOTE — Telephone Encounter (Signed)
Called and spoke to patient and let him know we advised 800UI a day. Verbalized udnerstanding

## 2017-02-20 ENCOUNTER — Ambulatory Visit: Payer: 59 | Admitting: Family Medicine

## 2017-02-27 ENCOUNTER — Ambulatory Visit: Payer: 59 | Admitting: Sports Medicine

## 2017-03-20 ENCOUNTER — Ambulatory Visit: Payer: 59 | Admitting: Sports Medicine

## 2017-03-20 ENCOUNTER — Ambulatory Visit (INDEPENDENT_AMBULATORY_CARE_PROVIDER_SITE_OTHER): Payer: 59 | Admitting: Family Medicine

## 2017-03-20 ENCOUNTER — Encounter: Payer: Self-pay | Admitting: Family Medicine

## 2017-03-20 DIAGNOSIS — I10 Essential (primary) hypertension: Secondary | ICD-10-CM | POA: Diagnosis not present

## 2017-03-20 DIAGNOSIS — I7410 Embolism and thrombosis of unspecified parts of aorta: Secondary | ICD-10-CM

## 2017-03-20 DIAGNOSIS — M1A9XX0 Chronic gout, unspecified, without tophus (tophi): Secondary | ICD-10-CM | POA: Diagnosis not present

## 2017-03-20 DIAGNOSIS — I480 Paroxysmal atrial fibrillation: Secondary | ICD-10-CM

## 2017-03-20 NOTE — Assessment & Plan Note (Signed)
S: controlled on amlodipine 2.5mg  and atenolol 50mg  BID. Home #s usually 140s instead of 150s with addition of amlodipine 2.5mg .  BP Readings from Last 3 Encounters:  03/20/17 126/88  12/26/16 (!) 152/82  12/01/16 140/88  A/P: blood pressure goal of <140/90. Continue current meds:  Very pleased with improvement with addition of amlodipine 2.5mg  though prefer home #s would be lower- as long as at goal in office will monitor only. Possible home cuff runs slightly higher than our readings. Could bring to future visit

## 2017-03-20 NOTE — Progress Notes (Signed)
Subjective:  Travis Pruitt is a 82 y.o. year old very pleasant male patient who presents for/with See problem oriented charting ROS- no recent hot, swollen joints. No chest pain or shortness of breath. No edema.    Past Medical History-  Patient Active Problem List   Diagnosis Date Noted  . Syncope 01/06/2016    Priority: High  . PAF (paroxysmal atrial fibrillation) ([place]) 12/10/2015    Priority: High  . History of paroxysmal supraventricular tachycardia 12/03/2015    Priority: High  . Multiple rib fractures 11/30/2015    Priority: Medium  . Hyperlipidemia 10/25/2006    Priority: Medium  . Gout 10/25/2006    Priority: Medium  . Essential hypertension 10/25/2006    Priority: Medium  . Hx of adenomatous colonic polyps 01/12/2015    Priority: Low  . Generalized anxiety disorder 11/27/2007    Priority: Low  . Aortic atherosclerosis ([place]) 12/01/2016  . Sternoclavicular (joint) (ligament) sprain, right, sequela 09/26/2016  . Scapular dyskinesis 09/26/2016  . Rotator cuff syndrome of right shoulder 09/26/2016    Medications- reviewed and updated Current Outpatient Medications  Medication Sig Dispense Refill  . allopurinol (ZYLOPRIM) 100 MG tablet TAKE 1 TABLET (100 MG TOTAL) BY MOUTH DAILY. 90 tablet 1  . amLODipine (NORVASC) 2.5 MG tablet Take 1 tablet (2.5 mg total) by mouth daily. 30 tablet 5  . atenolol (TENORMIN) 50 MG tablet TAKE 1 TABLET (50 MG TOTAL) BY MOUTH 2 (TWO) TIMES DAILY. 180 tablet 3  . ibuprofen (ADVIL,MOTRIN) 200 MG tablet Take 200 mg by mouth every 8 (eight) hours as needed for moderate pain.      No current facility-administered medications for this visit.     Objective: BP 126/88 (BP Location: Left Arm, Patient Position: Sitting, Cuff Size: Large)   Pulse (!) 57   Temp 98.1 F (36.7 C) (Oral)   Ht 5' 9.5" (1.765 m)   Wt 176 lb 9.6 oz (80.1 kg)   SpO2 96%   BMI 25.71 kg/m  Gen: NAD, resting comfortably CV: RRR no murmurs rubs or gallops Lungs:  CTAB no crackles, wheeze, rhonchi Abdomen: soft/nontender/nondistended/normal bowel sounds.  Ext: no edema Skin: warm, dry  Assessment/Plan:  Other issues 1. Lost his dog in January- was very close to him and had to put him down- has been particularly hard on him.  2. Lost 2 good friends in January as well 3. Teeth also fell out in January- getting temporaries this week so he is excited 4. Perhaps 1 advil a week for shoulder- doing really well without nitroglycerin patch now 5. Sent message on mychart asking him to follow up with cardiology from 02/2016 visit- never had stress test and they were following potential aortic thrombus.   Gout S: no gout flares on allopurinol 100mg  Lab Results  Component Value Date   LABURIC 6.0 12/29/2016  A/P: he asks about stopping allopurinol- with him being right at goal I advised against changing medication  PAF (paroxysmal atrial fibrillation) ([facility]) S: HR slightly bradycardic on atenolol 50mg  BID. He has opted out of anticoagulation- but agrees if obviously recurrent that we would need to start this.   His only episode of atrial fibrillation was after a fall from a 15 foot ladder.  A/P: will continue to monitor. Continue atenolol in case recurrent for rate control. He wants to remain off anticoagulation such as xarelto or antiplatelet like aspirin for now.   Essential hypertension S: controlled on amlodipine 2.5mg  and atenolol 50mg  BID. Home #s usually  140s instead of 150s with addition of amlodipine 2.5mg .  BP Readings from Last 3 Encounters:  03/20/17 126/88  12/26/16 (!) 152/82  12/01/16 140/88  A/P: blood pressure goal of <140/90. Continue current meds:  Very pleased with improvement with addition of amlodipine 2.5mg  though prefer home #s would be lower- as long as at goal in office will monitor only. Possible home cuff runs slightly higher than our readings. Could bring to future visit    Future Appointments  Date Time Provider [place]  08/16/2017 11:00 AM Marin Olp, MD [facility]   Return in about 5 months (around 08/17/2017) for follow up- or sooner if needed.  Return precautions advised.  Garret Reddish, MD

## 2017-03-20 NOTE — Patient Instructions (Addendum)
No changes today  So sorry to hear about your dog, your friends, and your teeth. I hope the next few months are better for you than January

## 2017-03-20 NOTE — Assessment & Plan Note (Signed)
S: HR slightly bradycardic on atenolol 50mg  BID. He has opted out of anticoagulation- but agrees if obviously recurrent that we would need to start this.   His only episode of atrial fibrillation was after a fall from a 15 foot ladder.  A/P: will continue to monitor. Continue atenolol in case recurrent for rate control. He wants to remain off anticoagulation such as xarelto or antiplatelet like aspirin for now.

## 2017-03-20 NOTE — Assessment & Plan Note (Signed)
S: no gout flares on allopurinol 100mg  Lab Results  Component Value Date   LABURIC 6.0 12/29/2016  A/P: he asks about stopping allopurinol- with him being right at goal I advised against changing medication

## 2017-04-04 ENCOUNTER — Telehealth: Payer: Self-pay | Admitting: Family Medicine

## 2017-04-04 NOTE — Telephone Encounter (Signed)
CRM Created 

## 2017-04-04 NOTE — Telephone Encounter (Signed)
Sent by [employer] but he didn't read. Please call him to update him.   "Mr. Travis Pruitt,   I was looking back through your chart after our visit and saw you have not seen cardiology since January 2018. They wanted you to have a stress test and follow up after that- also they were following the potential aortic thrombus. I want to advise you to follow up with Dr. Stanford Breed.   Thanks for your consideration,  Garret Reddish  "

## 2017-04-04 NOTE — Telephone Encounter (Signed)
Called patient and left a voicemail message asking for a return phone call. 

## 2017-04-29 IMAGING — CT CT CHEST W/ CM
2 of 5 series · 11 of 36 positions shown, 13 images · IV contrast (iopamidol)
Comparison: None.

CLINICAL DATA: Fall from ladder

EXAM:
CT CHEST, ABDOMEN, AND PELVIS WITH CONTRAST
TECHNIQUE: Multidetector CT imaging of the chest, abdomen and pelvis was
performed following the standard protocol during bolus
administration of intravenous contrast.
CONTRAST:  100mL [id number] IOPAMIDOL ([id number]) INJECTION 61%

[Series 2: c/a/p with · axial · 0.86mm/px · z∈[-591,-71]mm · 8 of 128 slices shown, 10 images]
[im 12/128  mediastinal]
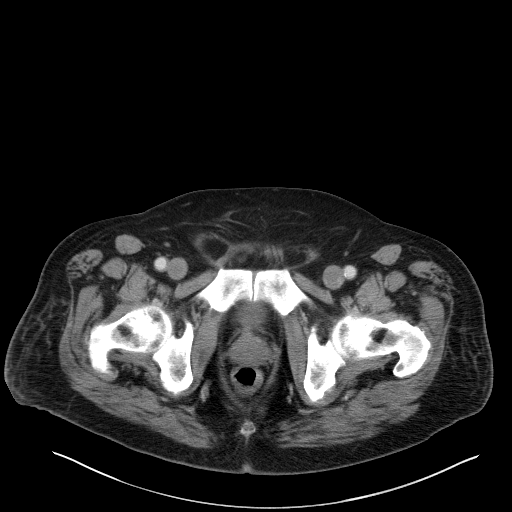
[im 12/128  lung]
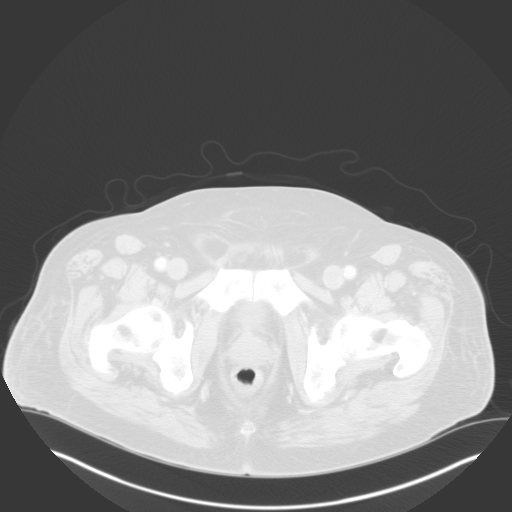
[im 24/128  lung]
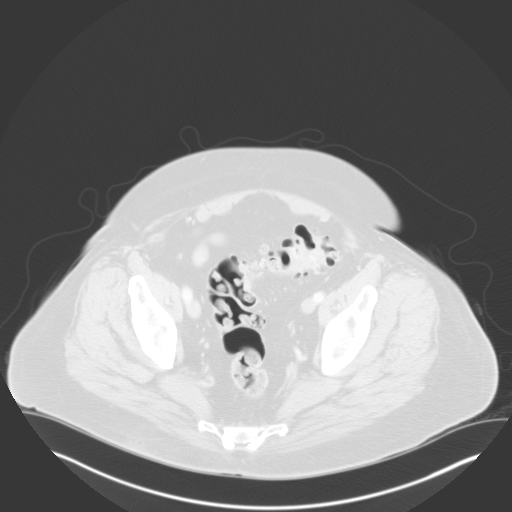
[im 47/128  lung]
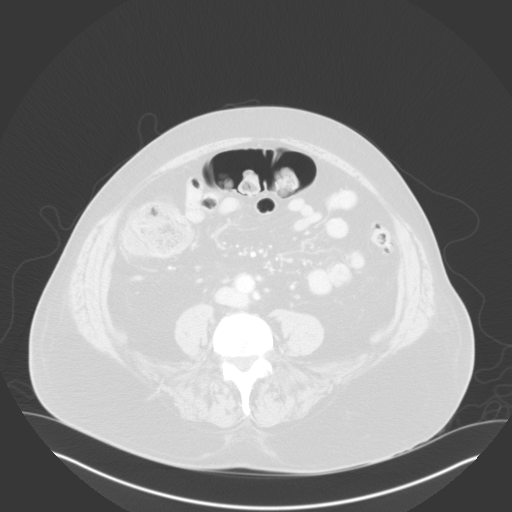
[im 58/128  lung]
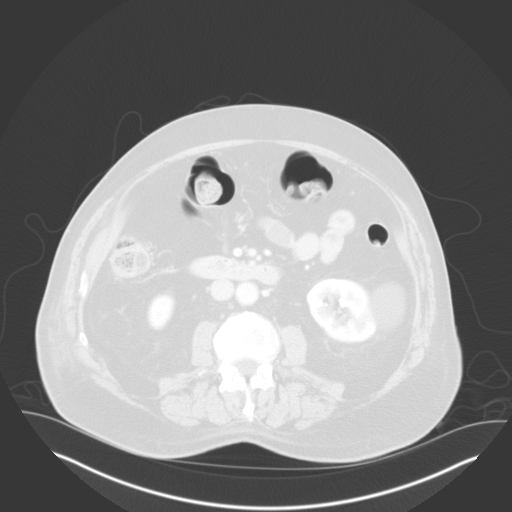
[im 70/128  mediastinal]
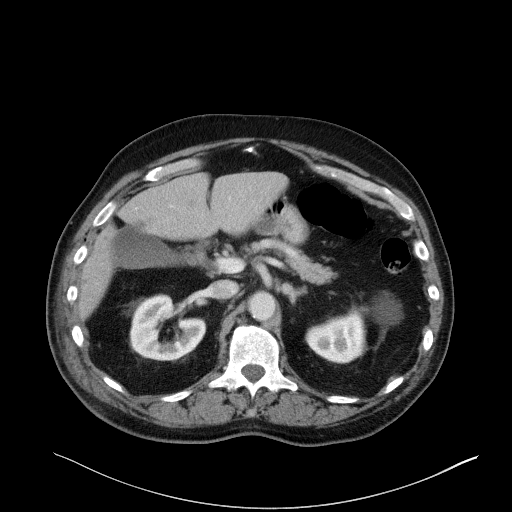
[im 70/128  lung]
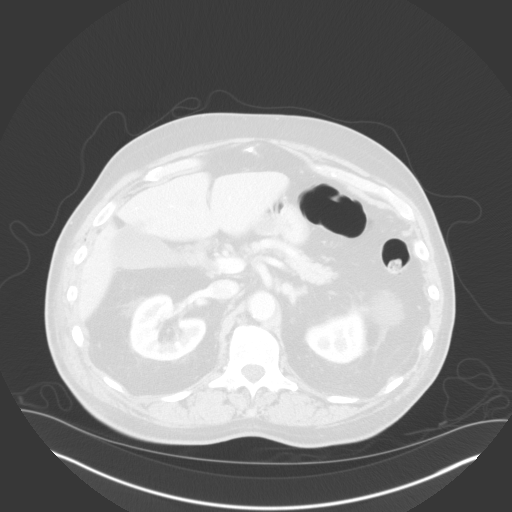
[im 81/128  lung]
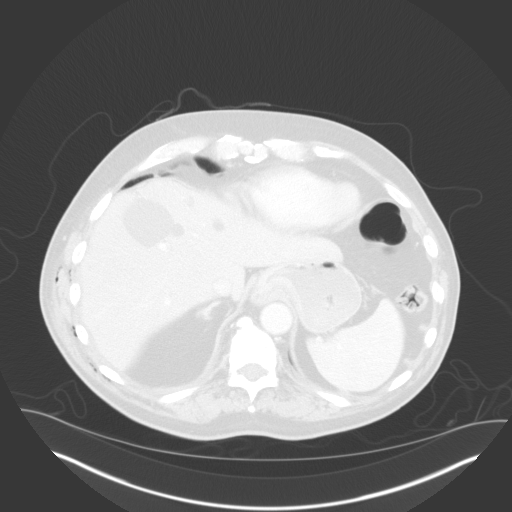
[im 104/128  lung]
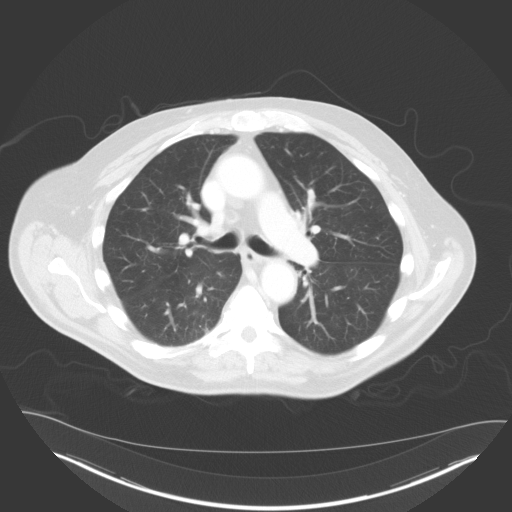
[im 116/128  lung]
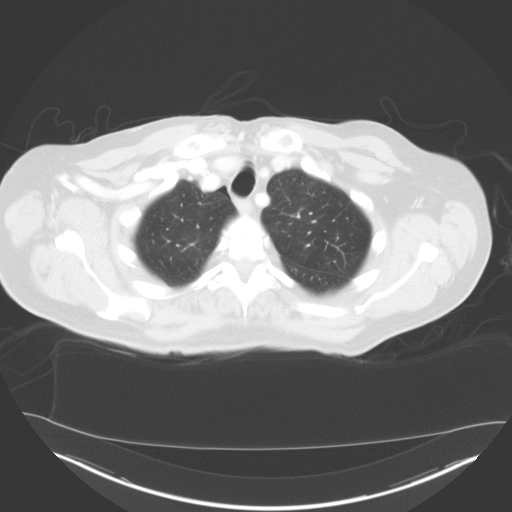

[Series 4: coronal · coronal · 0.79mm/px · 3 of 161 slices shown]
[im 33/161  lung]
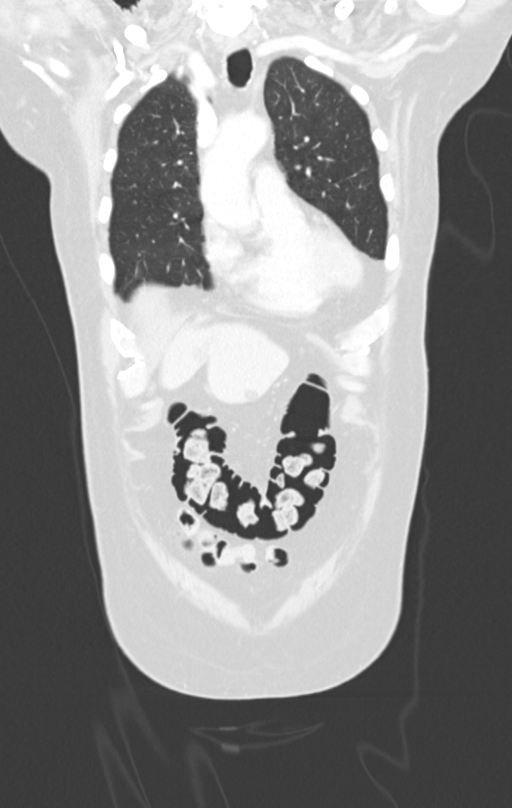
[im 65/161  lung]
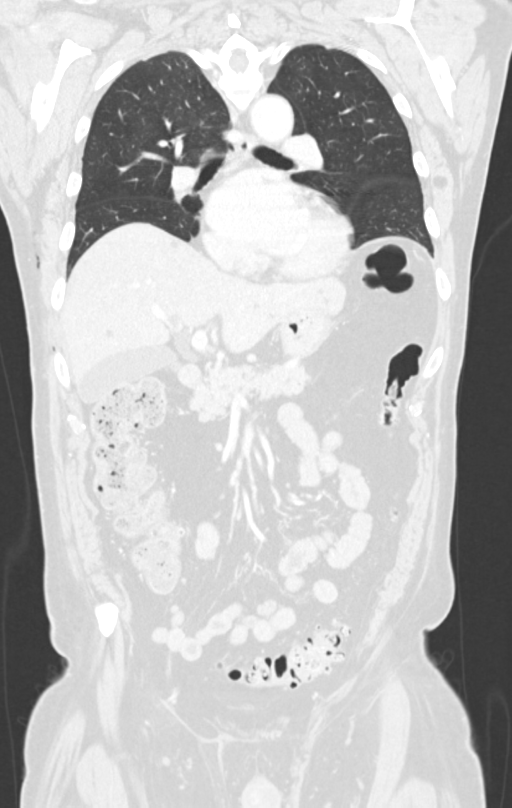
[im 97/161  lung]
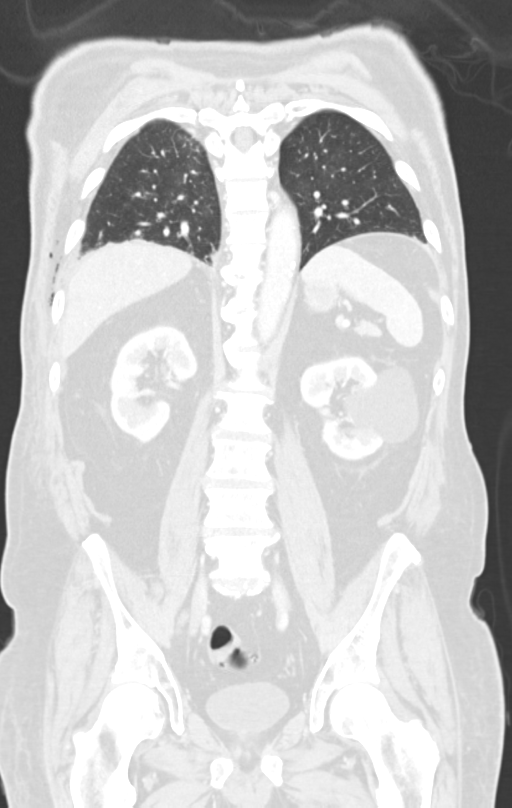

[11 of 36 positions shown; findings below may reference images not displayed]

FINDINGS: CT CHEST FINDINGS

Cardiovascular: There is no mediastinal hematoma. There is mild
atherosclerotic calcification in the aorta. A focal area of
decreased attenuation is noted in the aortic arch just distal to the
left subclavian artery measuring 6 x 6 mm, possibly a small
intramural hematoma. It is best seen on coronal slice 37 series 4
and sagittal slice 104 series 5. No other mucosal lesions are
evident in the aorta on this study. Pericardium is not thickened. No
major vessel pulmonary embolus is evident.

Mediastinum/Nodes: Thyroid appears unremarkable. There is no evident
thoracic adenopathy. There are subcentimeter lymph nodes which do
not meet size criteria for pathologic significance. There is a focal
hiatal hernia.

Lungs/Pleura: There is a right pleural effusion with atelectasis/
consolidation in the right lower lobe, primarily in the superior and
posterior segments. There is mild atelectasis in the left base.
There is a minimal pneumothorax on the right in the medial apex
region. Somewhat greater soft tissue air is noted on the right. The
lungs elsewhere are clear.

Musculoskeletal: There are fractures of the lateral right ninth,
tenth, and eleventh ribs and posterior right twelfth rib, not
significantly displaced. No other fractures are evident. Soft tissue
air is noted on the right. There is degenerative change in the lower
thoracic spine.

CT ABDOMEN PELVIS FINDINGS

Hepatobiliary: Multiple cysts are noted throughout the liver. Cysts
range in size from as small as 3 mm to as large as 4.0 x 3.6 cm.
This largest cyst is in the anterior segment right lobe near the
dome. There is no perihepatic fluid. No liver laceration or rupture
is evident. There is mild fatty infiltration near the fissure for
the ligamentum teres. Gallbladder wall is not appreciably thickened.
There is no biliary duct dilatation.

Pancreas: No pancreatic mass or inflammatory focus is evident. There
is no peripancreatic fluid.

Spleen: Spleen appears intact without laceration or rupture. No
focal splenic lesions are identified. There is no perisplenic fluid.

Adrenals/Urinary Tract: Right adrenal is normal. There is a 1.1 x
1.0 cm left adrenal adenoma. There is a cyst arising from the
lateral left kidney measuring 6.9 x 6.0 cm. No other focal renal
lesions are identified. There is no hydronephrosis on either side.
No renal laceration or rupture is evident. There is no perinephric
fluid. No renal or ureteral calculus is identified. Urinary bladder
is midline with wall thickness within normal limits.

Stomach/Bowel: There are multiple colonic diverticulum, most notably
in the distal transverse colon, descending colon, and splenic
flexure regions. No evidence of diverticulitis. There is no bowel
wall or mesenteric thickening. No bowel obstruction. No free air or
portal venous air is evident.

Vascular/Lymphatic: There is mild atherosclerotic calcification in
the aorta. There is no abdominal aortic aneurysm. Major mesenteric
vessels appear intact. There is no periaortic fluid. No adenopathy
is evident in the abdomen or pelvis.

Reproductive: Prostate and seminal vesicles appear unremarkable. No
pelvic mass or pelvic fluid collection is evident.

Other: There is fat in each inguinal ring. Appendix appears normal.
No ascites or abscess is evident in the abdomen or pelvis. There is
no abnormal peritoneal or retroperitoneal stranding or fluid
collection.

Musculoskeletal: There is degenerative change in the lumbar spine.
No fractures are noted in the bones of the abdomen and pelvis. There
is no intramuscular or abdominal wall region.
IMPRESSION: CT chest: Fractures of the right ninth, tenth, eleventh, and twelfth
ribs with small right apical pneumothorax and moderate soft tissue
air laterally on the right. There is a right pleural effusion with
consolidation/ atelectasis in the right base. There is mild
posterior left base atelectasis.

There is a small focus of decreased attenuation in the aortic arch,
possibly a small intramural hematoma. No dissection or transsection
seen. No mediastinal hematoma evident. No aneurysm.

There is a focal hiatal hernia.  No adenopathy.

CT abdomen and pelvis: Multiple liver cysts. Sizable cyst arising
from left kidney. No traumatic appearing lesion identified in the
abdomen or pelvis. Multiple colonic diverticula without
diverticulitis. No inflammatory focus. No bowel obstruction.
Appendix appears normal. No abscess. No fractures evident in the
bones of the abdomen and pelvis. Small left adrenal adenoma.

Critical Value/emergent results were called by telephone at the time
of interpretation on 11/30/2015 at [DATE] to Dr. KIRI JIM , who
verbally acknowledged these results.

## 2017-05-16 ENCOUNTER — Ambulatory Visit: Payer: 59 | Admitting: Podiatry

## 2017-05-16 ENCOUNTER — Encounter: Payer: Self-pay | Admitting: Podiatry

## 2017-05-16 DIAGNOSIS — Q828 Other specified congenital malformations of skin: Secondary | ICD-10-CM | POA: Diagnosis not present

## 2017-05-16 NOTE — Progress Notes (Signed)
He presents today with chief complaint of painful calluses sub-fifth metatarsophalangeal joints bilaterally.  States that he lost a considerable amount of weight and he has developed calluses or something like a wart on the bottom.  Objective: Vital signs are stable alert and oriented x3.  Pulses are palpable.  Neurologic sensorium is intact.  Deep tendon reflexes are intact he has solitary poor keratomas to the plantar aspect of the bilateral fifth metatarsals was debrided today alleviated all of his symptoms.  There were no iatrogenic lesions.  Assessment: Porokeratosis sub-fifth met bilateral.  More than likely associated with weight loss.  Plan: I debrided these areas today for him.

## 2017-05-31 ENCOUNTER — Other Ambulatory Visit: Payer: Self-pay | Admitting: Family Medicine

## 2017-05-31 ENCOUNTER — Encounter: Payer: Self-pay | Admitting: Family Medicine

## 2017-06-06 ENCOUNTER — Ambulatory Visit: Payer: 59 | Admitting: Sports Medicine

## 2017-06-13 ENCOUNTER — Encounter: Payer: Self-pay | Admitting: Family Medicine

## 2017-06-13 DIAGNOSIS — Z85828 Personal history of other malignant neoplasm of skin: Secondary | ICD-10-CM | POA: Insufficient documentation

## 2017-06-19 ENCOUNTER — Ambulatory Visit: Payer: 59 | Admitting: Internal Medicine

## 2017-07-10 ENCOUNTER — Ambulatory Visit: Payer: 59 | Admitting: Family Medicine

## 2017-07-24 ENCOUNTER — Ambulatory Visit: Payer: 59 | Admitting: Family Medicine

## 2017-07-31 ENCOUNTER — Telehealth: Payer: Self-pay | Admitting: Internal Medicine

## 2017-07-31 ENCOUNTER — Telehealth: Payer: Self-pay | Admitting: Family Medicine

## 2017-07-31 NOTE — Telephone Encounter (Signed)
See note. Can we see if he can get an earlier appointment?

## 2017-07-31 NOTE — Telephone Encounter (Signed)
Copied from [place] [phone] [phone]. Topic: Quick Communication - See Telephone Encounter >> Jul 31, 2017  9:47 AM Rutherford Nail, NT wrote: CRM for notification. See Telephone encounter for: 07/31/17. Patient would like a call from Dr Yong Channel. States that it is about getting help setting up an appointment with Dr Carlean Purl. States that the appointment that he received from them was going to be in September, 2019. Wants to know if Dr Yong Channel is able to help him get it sooner?  CB#: [phone]

## 2017-07-31 NOTE — Telephone Encounter (Signed)
Patient has been rescheduled to 08/10/17

## 2017-08-01 NOTE — Telephone Encounter (Signed)
I attempted to contact the patient twice to discuss his concerns about his [facility] referral. I was unable to leave a voicemail due to a busy signal. I called and spoke with [facility] and spoke to [facility] who stated that their office did get the patient scheduled for 08/10/17 with Dr. Nicoletta Ba however, the patient requested to see Dr. [name] who did not have availability until 7/18 which the patient is currently scheduled for. I asked what other options the patient could have and they stated that the date of 7/11 is now booked and the 7/18 is now the next available. Also, that Dr. Carlean Purl is even booked out until September.  Awaiting for patient's call back to discuss.

## 2017-08-01 NOTE — Telephone Encounter (Signed)
Pt is calling back and wanted to talk to Dr Yong Channel. Pt is upset and wants to speak with someone. Please advise [phone]

## 2017-08-10 ENCOUNTER — Ambulatory Visit: Payer: 59 | Admitting: Physician Assistant

## 2017-08-14 ENCOUNTER — Telehealth: Payer: Self-pay | Admitting: Family Medicine

## 2017-08-14 NOTE — Telephone Encounter (Signed)
Copied from [place] [id number]. Topic: Quick Communication - See Telephone Encounter >> Aug 14, 2017 12:24 PM Hewitt Shorts wrote: Pt is requesting a call back from either [facility] or Dr. Yong Channel himself and pt would not go into details as to what this message is for he stated he would talk with them once once of them called    Best number [phone]

## 2017-08-14 NOTE — Telephone Encounter (Signed)
Called and left a voicemail message asking for a return phone call 

## 2017-08-16 ENCOUNTER — Ambulatory Visit: Payer: 59 | Admitting: Family Medicine

## 2017-08-16 ENCOUNTER — Ambulatory Visit: Payer: 59 | Admitting: Nurse Practitioner

## 2017-08-16 NOTE — Telephone Encounter (Signed)
Called but no answer received and unable to leave a voicemail message

## 2017-08-17 ENCOUNTER — Ambulatory Visit: Payer: 59 | Admitting: Nurse Practitioner

## 2017-08-17 ENCOUNTER — Encounter

## 2017-08-17 ENCOUNTER — Encounter: Payer: Self-pay | Admitting: Nurse Practitioner

## 2017-08-17 VITALS — BP 140/90 | HR 64 | Ht 68.5 in | Wt 173.0 lb

## 2017-08-17 DIAGNOSIS — K625 Hemorrhage of anus and rectum: Secondary | ICD-10-CM | POA: Diagnosis not present

## 2017-08-17 DIAGNOSIS — R194 Change in bowel habit: Secondary | ICD-10-CM

## 2017-08-17 DIAGNOSIS — K648 Other hemorrhoids: Secondary | ICD-10-CM | POA: Diagnosis not present

## 2017-08-17 MED ORDER — HYDROCORTISONE 2.5 % RE CREA: 1.0000 "application " | TOPICAL_CREAM | Freq: Every day | RECTAL | 1 refills | Status: DC

## 2017-08-17 NOTE — Progress Notes (Addendum)
IMPRESSION and PLAN:    82 year old male here with bowel changes and painless rectal bleeding.  Approximately 8 weeks ago stools became smaller in caliber, now just passing small soft pellet sized pieces of stool several times a day. Internal hemorrhoid on anoscopy. He is concerned about colon cancer -Overall presentation compatible with constipation, bleeding likely from internal hemorrhoid.  Recommend daily Benefiber.  For now will also add in one half capful of MiraLAX mixed with a glass of water daily - Anusol cream inside rectum nightly for 10 nights -Return office visit with me in 2 to 3 weeks, call in the interim if not improving -At some point if hemorrhoids do not respond, consider banding -I tried to reassure patient that an obstructing rectal masse unlikely given that he had a colonoscopy in 2016 but that isn't to say he wouldn't come to one if needed.     Agree with Ms. Dawanna Grauberger's assessment and plan.  He has since called back and requested colonoscopy which is also reasonable.  I do suspect he is having problems from diverticulosis and hemorrhoids but if not responding to treatment can arrange a colonoscopy. RN will call him.  Gatha Mayer, MD, [facility]    HPI:    Chief Complaint: bowel changes, rectal bleeding   Patient is a 82 yo male known to Dr. Carlean Purl.  He has a history of diverticulosis and bleeding internal hemorrhoids.  Patient was last seen in February 2018 for evaluation of bowel changes with associated rectal bleeding following a fall from a ladder .  Since he had had a colonoscopy in 2016, plan was just to monitor for the time being. Bowel habits returned to baseline, bleeding ceased.   Patient comes in with recurrent bowel changes. Approximately 8 weeks ago stools became smaller in caliber and now having small pellet size pieces of soft stool several times a day. He has been seeing some blood with bowel movements. No abdominal pain, N/V.    Review of  systems:     No chest pain, no SOB, no fevers, no urinary sx   Past Medical History:  Diagnosis Date  . Anxiety   . Colon polyp   . COLONIC POLYPS, ADENOMATOUS, HX OF 11/11/2003   Annotation: COLONOSCOPY 10/05 ([facility]) COLONOSCOPY 04/11/07 Qualifier: Diagnosis of  By: Carlean Purl MD, Dimas Millin Diverticulosis   . Gout   . Hx of adenomatous colonic polyps 01/12/2015  . Hyperlipidemia   . Hypertension   . PAF (paroxysmal atrial fibrillation) ([place])    a. diagnosed in 12/2015 b. started on Eliquis for anticoagulation    Patient's surgical history, family medical history, social history, medications and allergies were all reviewed in Epic   Creatinine clearance cannot be calculated (Patient's most recent lab result is older than the maximum 21 days allowed.)   Physical Exam:     BP 140/90 (BP Location: Left Arm, Patient Position: Sitting, Cuff Size: Normal)   Pulse 64   Ht 5' 8.5" (1.74 m) Comment: height measured without shoes  Wt 173 lb (78.5 kg)   BMI 25.92 kg/m   GENERAL:  Pleasant well developed male in NAD PSYCH: : Cooperative, normal affect EENT:  conjunctiva pink, mucous membranes moist, neck supple without masses CARDIAC:  RRR,  no peripheral edema PULM: Normal respiratory effort ABDOMEN:  Nondistended, soft, nontender. No obvious masses, no hepatomegaly Rectal:  External hemorrhoid tags, on anoscopy there is an inflamed right posterior hermorrhoid SKIN:  turgor, no  lesions seen Musculoskeletal:  Normal muscle tone, normal strength NEURO: Alert and oriented x 3, no focal neurologic deficits   [name] , NP 08/17/2017, 1:40 PM

## 2017-08-17 NOTE — Telephone Encounter (Signed)
Third attempt to contact patient. Left a voicemail message asking for a return phone call

## 2017-08-17 NOTE — Patient Instructions (Addendum)
If you are age 80 or older, your body mass index should be between 23-30. Your Body mass index is 25.92 kg/m. If this is out of the aforementioned range listed, please consider follow up with your Primary Care Provider.  If you are age 46 or younger, your body mass index should be between 19-25. Your Body mass index is 25.92 kg/m. If this is out of the aformentioned range listed, please consider follow up with your Primary Care Provider.   If you are age 42 or older, your body mass index should be between 23-30. Your Body mass index is 25.92 kg/m. If this is out of the aforementioned range listed, please consider follow up with your Primary Care Provider.  If you are age 17 or younger, your body mass index should be between 19-25. Your Body mass index is 25.92 kg/m. If this is out of the aformentioned range listed, please consider follow up with your Primary Care Provider.   Anusol cream  Mix 1/2 capful of Miralax in 8 ounces of water daily.  Start Benefiber (over-the-counter) daily.  Follow up with me on September 01, 2017 at 2 pm.  Thank you for choosing me and [facility].   [name], NP

## 2017-08-21 ENCOUNTER — Telehealth: Payer: Self-pay | Admitting: Nurse Practitioner

## 2017-08-21 NOTE — Telephone Encounter (Signed)
Dr Carlean Purl this very nice patient would like to go forward with a colonoscopy. I do not see the discussion in the notes. He states he is following the instructions given to him by [name], NP-C. He has considered this and has decided it would be important to have a visual exam by colonoscopy so "we know for certain what is going on in there." Okay to schedule?

## 2017-08-22 NOTE — Telephone Encounter (Signed)
I have spoken with the patient. He will come for his nurse visit on 08/29/17 at 10:00 am. The colonoscopy will be 09/05/17 at 10:30 am.

## 2017-08-22 NOTE — Telephone Encounter (Signed)
OK to schedule a colonoscopy for change in bowel habits and rectal bleeding  OK to use a 10000 slot or a 730 slot if I do not have an opening  Would aim for August in him  Cancel any pending office appt

## 2017-08-29 NOTE — Telephone Encounter (Addendum)
Patient comes in for his pre-visit appointment. He states he was unaware that his office visit with [name] NP was cancelled. The patient is angry and upset that this was done. He states the appointment was cancelled without his consent and that it was assumed that he wanted this. After intense conversation with the patient he agreed to come in for the next available appointment on 09/13/17 with Nevin Bloodgood. He is pleased with how she has "pushed the right buttons and make things better. Also states "I know what the problem is. It's a damn hemorrhoid that is blocking a place that should not be blocked." He was offered earlier appointments with other APPs. He declined these.  He has also cancelled the pre-visit and colonoscopy appointments. States that a colonoscopy should not be taken lightly.  Lesly Rubenstein Monegro witnessed the interaction with the patient.

## 2017-08-30 NOTE — Telephone Encounter (Signed)
My understanding is he asked to proceed with colonoscopy and in my medical judgement that was and is reasonable.  We will have him see Nevin Bloodgood again as outlined.

## 2017-08-30 NOTE — Telephone Encounter (Signed)
When he came in the office on 08/29/17 for his Pre-Visit he was highly agitated that he was no longer scheduled to see Nevin Bloodgood on 09/01/17. In his state of anger he cancelled the colonoscopy.  He was speaking with a raised voiced about my "assumption" that he did not need to see Nevin Bloodgood again. His personality was very different from how he was on 08/21/17.  I simply accepted the blame and scheduled him an office visit.   It was a confusing encounter with him in the office on 08/29/17 since the conversation I had with him on the phone 08/21/17 was specifically a call to schedule the procedure. The phone call to schedule was initiated by him. He called requesting the colonoscopy.

## 2017-08-30 NOTE — Telephone Encounter (Signed)
Hey everyone- thanks for caring for Travis Pruitt. He is a really nice gentleman but can be very particular/need a fair amount of in person counseling from people he trusts. It makes sense to me for him to sit down with Travis Pruitt and come up with a firm plan since he trusts her.   Thank you all so much for your efforts in caring for him.   Garret Reddish

## 2017-09-01 ENCOUNTER — Ambulatory Visit: Payer: 59 | Admitting: Nurse Practitioner

## 2017-09-05 ENCOUNTER — Encounter: Payer: 59 | Admitting: Internal Medicine

## 2017-09-13 ENCOUNTER — Ambulatory Visit: Payer: 59 | Admitting: Nurse Practitioner

## 2017-09-13 ENCOUNTER — Encounter: Payer: Self-pay | Admitting: Nurse Practitioner

## 2017-09-13 VITALS — BP 126/72 | HR 72 | Ht 69.5 in | Wt 175.0 lb

## 2017-09-13 DIAGNOSIS — K5900 Constipation, unspecified: Secondary | ICD-10-CM | POA: Diagnosis not present

## 2017-09-13 NOTE — Patient Instructions (Signed)
If you are age 80 or older, your body mass index should be between 23-30. Your Body mass index is 25.47 kg/m. If this is out of the aforementioned range listed, please consider follow up with your Primary Care Provider.  If you are age 60 or younger, your body mass index should be between 19-25. Your Body mass index is 25.47 kg/m. If this is out of the aformentioned range listed, please consider follow up with your Primary Care Provider.   Call as needed.  Thank you for choosing me and [facility].   [name], NP

## 2017-09-16 NOTE — Progress Notes (Addendum)
Primary GI:   Silvano Rusk, MD        Chief Complaint:    Follow up on constipation and rectal bleeding  IMPRESSION and PLAN:    82 year old male here with severe diverticular disease / luminal narrowing with recent bowel changes and painless rectal bleeding. Constipation significantly improved after addition of fiber and miralax. Bleeding resolved.  -when I saw patient in mid July he was worried about colon cancer. His last colonoscopy was ~ 3 years , I tried to reassure him that colon cancer was unlikely and that he has severe diverticular disease with associated luminal narrowing. Now since bleeding has resolved and BMs are significantly better with current regimen patient feels greatly relieved.  -He inquires about definitive treatment for the luminal narrowing associated with diverticulosis. I explained that constipation can be multi-factorial and furthermore, unless one is nearly obstructed then surgery is not warranted. Treatment is the management of constipation -continue daily Benefiber and 1/2 to 1 capful of Miralax daily.  -He understands that this regimen may not work forever and may  teaking at some point. He will call us back for any recurrent bowel changes / rectal bleeding.      Agree with Ms. Guenther's assessment and plan. Gatha Mayer, MD, [facility]   HPI:     Patient is a 82 yo male who I saw mid July with a two month history of bowel changes with associated rectal bleeding. Patient was concerned that he could have colon cancer or that a hemorrhoid was causing obstruction. He called back a few days after our visit and wanted to be scheduled for a colonoscopy which he was. Then patient decided he wanted to be seen back in clinic instead so worked in today. He has actually improved. No further bleeding and bowel movements have almost completely normalized. He is having a daily BM now on the fiber and miralax. No abdominal pain.    Review of systems:     No chest pain, no  SOB, no fevers, no urinary sx   Past Medical History:  Diagnosis Date  . Anxiety   . Colon polyp   . COLONIC POLYPS, ADENOMATOUS, HX OF 11/11/2003   Annotation: COLONOSCOPY 10/05 ([facility]ESSNER) COLONOSCOPY 04/11/07 Qualifier: Diagnosis of  By: Carlean Purl MD, Dimas Millin Diverticulosis   . Gout   . Hx of adenomatous colonic polyps 01/12/2015  . Hyperlipidemia   . Hypertension   . PAF (paroxysmal atrial fibrillation) ([place])    a. diagnosed in 12/2015 b. started on Eliquis for anticoagulation    Patient's surgical history, family medical history, social history, medications and allergies were all reviewed in Epic   Creatinine clearance cannot be calculated (Patient's most recent lab result is older than the maximum 21 days allowed.)  Current Outpatient Medications  Medication Sig Dispense Refill  . allopurinol (ZYLOPRIM) 100 MG tablet TAKE 1 TABLET (100 MG TOTAL) BY MOUTH DAILY. 90 tablet 1  . amLODipine (NORVASC) 2.5 MG tablet TAKE ONE TABLET BY MOUTH DAILY 30 tablet 5  . atenolol (TENORMIN) 50 MG tablet TAKE 1 TABLET (50 MG TOTAL) BY MOUTH 2 (TWO) TIMES DAILY. 180 tablet 3  . ibuprofen (ADVIL,MOTRIN) 200 MG tablet Take 200 mg by mouth every 8 (eight) hours as needed for moderate pain.     . hydrocortisone (ANUSOL-HC) 2.5 % rectal cream Place 1 application rectally at bedtime. Insert inside of rectum (Patient not taking: Reported on 09/13/2017) 30 g 1   No current  facility-administered medications for this visit.     Physical Exam:     BP 126/72   Pulse 72   Ht 5' 9.5" (1.765 m)   Wt 175 lb (79.4 kg)   BMI 25.47 kg/m   GENERAL:  Pleasant male in NAD PSYCH: : Cooperative, normal affect EENT:  conjunctiva pink, mucous membranes moist, neck supple without masses CARDIAC:  RRR, no peripheral edema PULM: Normal respiratory effort, lungs CTA bilaterally, no wheezing ABDOMEN:  Nondistended, soft, nontender. No obvious masses, no hepatomegaly,  normal bowel sounds Rectal : no masses  or stool in vault SKIN:  turgor, no lesions seen Musculoskeletal:  Normal muscle tone, normal strength NEURO: Alert and oriented x 3, no focal neurologic deficits  I spent 25 minutes of face-to-face time with the patient. Greater than 50% of the time was spent counseling and coordinating care.  Questions answered   [name] , NP 09/16/2017, 9:53 PM

## 2017-09-25 ENCOUNTER — Telehealth: Payer: Self-pay | Admitting: Nurse Practitioner

## 2017-09-25 NOTE — Telephone Encounter (Signed)
Patient reluctant to discuss this with me.  He is calling about internal hemorrhoids. He had discussed this at his last visit. He wants this taken care of. Denies any problem at this time "getting along fine." However he does want it "taken care of." He would prefer to speak directly with you about it.

## 2017-09-26 NOTE — Telephone Encounter (Signed)
Pt calling back in regards of this states he is needing to speak with someone today. Best call back [phone]

## 2017-09-26 NOTE — Telephone Encounter (Signed)
I reviewed his situation and concerns He will call and make an appointment to discuss his stuation and see if there "is anything else to bedone to improve my situation"  He might be a candidate for hemorrhoid banding

## 2017-09-27 ENCOUNTER — Telehealth: Payer: Self-pay | Admitting: Internal Medicine

## 2017-09-27 NOTE — Telephone Encounter (Signed)
Please advise Sir? Put him on today or Friday?

## 2017-09-27 NOTE — Telephone Encounter (Signed)
The patient has misinterpreted I wanted him to call soon to get an appointment next available.  Colletta [place] please call him back and explain and that it is okay for him to see me when I have an opening for a follow-up.  October is what that will likely be I think.

## 2017-10-03 ENCOUNTER — Ambulatory Visit: Payer: 59 | Admitting: Family Medicine

## 2017-10-03 ENCOUNTER — Encounter: Payer: Self-pay | Admitting: Family Medicine

## 2017-10-03 VITALS — BP 128/80 | HR 54 | Temp 97.9°F | Ht 69.5 in | Wt 175.2 lb

## 2017-10-03 DIAGNOSIS — I480 Paroxysmal atrial fibrillation: Secondary | ICD-10-CM

## 2017-10-03 DIAGNOSIS — M1A9XX0 Chronic gout, unspecified, without tophus (tophi): Secondary | ICD-10-CM | POA: Diagnosis not present

## 2017-10-03 DIAGNOSIS — I10 Essential (primary) hypertension: Secondary | ICD-10-CM | POA: Diagnosis not present

## 2017-10-03 DIAGNOSIS — Z23 Encounter for immunization: Secondary | ICD-10-CM | POA: Diagnosis not present

## 2017-10-03 MED ORDER — ALLOPURINOL 100 MG PO TABS: ORAL_TABLET | ORAL | 3 refills | Status: DC

## 2017-10-03 MED ORDER — AMLODIPINE BESYLATE 2.5 MG PO TABS: 2.5000 mg | ORAL_TABLET | Freq: Every day | ORAL | 3 refills | Status: DC

## 2017-10-03 MED ORDER — ATENOLOL 50 MG PO TABS: ORAL_TABLET | ORAL | 3 refills | Status: DC

## 2017-10-03 NOTE — Assessment & Plan Note (Signed)
S: remains on atenolol 50mg  BID in case recurrence (plus helps BP). Opts out of anticoagulation- aware of potential stroke risk but givne no recurrence of a fib after 1 episode after falling from 15 foot ladder- he would like to stay off medicine- declines NOAC, asa, coumadin. A/P: continue current rx. Reviewed with him mychart message I sent him after last visit- From last visit "Sent message on mychart asking him to follow up with cardiology from 02/2016 visit- never had stress test and they were following potential aortic thrombus. " we placed an updated referral for him today to see Dr. Stanford Breed.

## 2017-10-03 NOTE — Patient Instructions (Signed)
No changes today  We will call you within two weeks about your referral to Cardiology with Dr. Stanford Breed. If you do not hear within 3 weeks, give [place] a call.

## 2017-10-03 NOTE — Progress Notes (Signed)
Subjective:  Travis Pruitt is a 82 y.o. year old very pleasant male patient who presents for/with See problem oriented charting ROS- No chest pain or shortness of breath. No headache or blurry vision.  Denies palpitations   Past Medical History-  Patient Active Problem List   Diagnosis Date Noted  . Syncope 01/06/2016    Priority: High  . PAF (paroxysmal atrial fibrillation) ([place]) 12/10/2015    Priority: High  . History of paroxysmal supraventricular tachycardia 12/03/2015    Priority: High  . Multiple rib fractures 11/30/2015    Priority: Medium  . Hyperlipidemia 10/25/2006    Priority: Medium  . Gout 10/25/2006    Priority: Medium  . Essential hypertension 10/25/2006    Priority: Medium  . History of skin cancer 06/13/2017    Priority: Low  . Hx of adenomatous colonic polyps 01/12/2015    Priority: Low  . Generalized anxiety disorder 11/27/2007    Priority: Low  . Aortic atherosclerosis ([place]) 12/01/2016  . Sternoclavicular (joint) (ligament) sprain, right, sequela 09/26/2016  . Scapular dyskinesis 09/26/2016  . Rotator cuff syndrome of right shoulder 09/26/2016    Medications- reviewed and updated Current Outpatient Medications  Medication Sig Dispense Refill  . allopurinol (ZYLOPRIM) 100 MG tablet TAKE 1 TABLET (100 MG TOTAL) BY MOUTH DAILY. 90 tablet 1  . amLODipine (NORVASC) 2.5 MG tablet TAKE ONE TABLET BY MOUTH DAILY 30 tablet 5  . atenolol (TENORMIN) 50 MG tablet TAKE 1 TABLET (50 MG TOTAL) BY MOUTH 2 (TWO) TIMES DAILY. 180 tablet 3  . ibuprofen (ADVIL,MOTRIN) 200 MG tablet Take 200 mg by mouth every 8 (eight) hours as needed for moderate pain.      No current facility-administered medications for this visit.     Objective: BP 128/80   Pulse (!) 54   Temp 97.9 F (36.6 C) (Oral)   Ht 5' 9.5" (1.765 m)   Wt 175 lb 3.2 oz (79.5 kg)   SpO2 99%   BMI 25.50 kg/m  Gen: NAD, resting comfortably CV: slightly bradycardic but regular no murmurs rubs or  gallops Lungs: CTAB no crackles, wheeze, rhonchi Abdomen: soft/nontender/nondistended Ext: no edema Skin: warm, dry  Assessment/Plan:  Gout S: 0 flares since last visit on allopurinol 100mg  Lab Results  Component Value Date   LABURIC 6.0 12/29/2016  A/P: continue current rx  Hypertension S: controlled on  amlodipine 2.5mg , atenolol 50mg  BID BP Readings from Last 3 Encounters:  10/03/17 128/80  09/13/17 126/72  08/17/17 140/90  A/P: We discussed blood pressure goal of <140/90. Continue current meds  Essential hypertension S: remains on atenolol 50mg  BID in case recurrence (plus helps BP). Opts out of anticoagulation- aware of potential stroke risk but givne no recurrence of a fib after 1 episode after falling from 15 foot ladder- he would like to stay off medicine- declines NOAC, asa, coumadin. A/P: continue current rx. Reviewed with him mychart message I sent him after last visit- From last visit "Sent message on mychart asking him to follow up with cardiology from 02/2016 visit- never had stress test and they were following potential aortic thrombus. " we placed an updated referral for him today to see Dr. Stanford Breed.    Future Appointments  Date Time Provider [place]  11/20/2017  2:45 PM Gatha Mayer, MD [facility]  04/05/2018 11:00 AM Marin Olp, MD [facility]   Return in about 6 months (around 04/03/2018) for physical.  Lab/Order associations: PAF (paroxysmal atrial fibrillation) ([place]) - Plan: Ambulatory  referral to Cardiology  Need for prophylactic vaccination and inoculation against influenza - Plan: Flu vaccine HIGH DOSE PF (Fluzone High dose)  Essential hypertension  Meds ordered this encounter  Medications  . allopurinol (ZYLOPRIM) 100 MG tablet    Sig: TAKE 1 TABLET (100 MG TOTAL) BY MOUTH DAILY.    Dispense:  90 tablet    Refill:  3  . amLODipine (NORVASC) 2.5 MG tablet    Sig: Take 1 tablet (2.5 mg total) by mouth daily.     Dispense:  90 tablet    Refill:  3  . atenolol (TENORMIN) 50 MG tablet    Sig: TAKE 1 TABLET (50 MG TOTAL) BY MOUTH 2 (TWO) TIMES DAILY.    Dispense:  180 tablet    Refill:  3    Return precautions advised.  Garret Reddish, MD

## 2017-11-20 ENCOUNTER — Ambulatory Visit: Payer: 59 | Admitting: Internal Medicine

## 2017-11-20 ENCOUNTER — Encounter: Payer: Self-pay | Admitting: Internal Medicine

## 2017-11-20 VITALS — BP 126/84 | HR 64 | Ht 68.5 in | Wt 172.0 lb

## 2017-11-20 DIAGNOSIS — K5730 Diverticulosis of large intestine without perforation or abscess without bleeding: Secondary | ICD-10-CM | POA: Diagnosis not present

## 2017-11-20 DIAGNOSIS — K641 Second degree hemorrhoids: Secondary | ICD-10-CM

## 2017-11-20 NOTE — Progress Notes (Signed)
   Travis Pruitt 82 y.o. 1935/06/01 [id number]  Assessment & Plan:   Encounter Diagnoses  Name Primary?  . Prolapsed internal hemorrhoids, grade 2 Yes  . Diverticulosis of colon     Stay on MiraLax I think that is working well and given the long left colon involvement with severe diverticulosis and luminal narrowing I do not think banding of the hemorrhoids would change anything and is not necessary. RTC prn  Subjective:   Chief Complaint: Narrow stools, hemorrhoids  HPI Patient is having problems with a history of slight rectal bleeding and persistent long narrow caliber stools.  He had seen [name], NP and fiber and/or MiraLAX was recommended and he is settled in on a MiraLAX regimen and is moving his bowels without difficulty but he wonders if he should have something done for this.  I had seen him in February 2018.  Problems seem to begin after he fell off a ladder 2017.  Wonders if fixing hemorrhoids would stop the long stools.  Last colonoscopy was in 2016 in December with 2 diminutive adenomas and severe diverticulosis especially in the left colon. No Known Allergies Current Meds  Medication Sig  . allopurinol (ZYLOPRIM) 100 MG tablet TAKE 1 TABLET (100 MG TOTAL) BY MOUTH DAILY.  Marland Kitchen amLODipine (NORVASC) 2.5 MG tablet Take 1 tablet (2.5 mg total) by mouth daily.  Marland Kitchen atenolol (TENORMIN) 50 MG tablet TAKE 1 TABLET (50 MG TOTAL) BY MOUTH 2 (TWO) TIMES DAILY.  Marland Kitchen ibuprofen (ADVIL,MOTRIN) 200 MG tablet Take 200 mg by mouth every 8 (eight) hours as needed for moderate pain.    Past Medical History:  Diagnosis Date  . Anxiety   . Colon polyp   . COLONIC POLYPS, ADENOMATOUS, HX OF 11/11/2003   Annotation: COLONOSCOPY 10/05 ([facility] Pusey) COLONOSCOPY 04/11/07 Qualifier: Diagnosis of  By: Carlean Purl MD, Dimas Millin Diverticulosis   . Gout   . Hx of adenomatous colonic polyps 01/12/2015  . Hyperlipidemia   . Hypertension   . PAF (paroxysmal atrial fibrillation) ([place])    a. diagnosed in 12/2015 b. started on Eliquis for anticoagulation   Past Surgical History:  Procedure Laterality Date  . COLONOSCOPY    . 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   Review of Systems As above  Objective:   Physical Exam BP 126/84 (BP Location: Left Arm, Patient Position: Sitting, Cuff Size: Normal)   Pulse 64   Ht 5' 8.5" (1.74 m)   Wt 172 lb (78 kg)   BMI 25.77 kg/m  No acute distress  The rectal exam reveals no mass and is nontender, there is brown stool which is soft/formed the perianal area is notable for several comedones  Anoscopy exam shows grade 2 mildly inflamed internal hemorrhoids

## 2017-11-20 NOTE — Patient Instructions (Signed)
  Please continue your miralax and follow up with [place] as needed.    I appreciate the opportunity to care for you. Silvano Rusk, MD, [facility]

## 2017-11-29 ENCOUNTER — Other Ambulatory Visit: Payer: Self-pay | Admitting: Family Medicine

## 2017-12-30 ENCOUNTER — Encounter

## 2018-01-26 ENCOUNTER — Encounter: Payer: Self-pay | Admitting: Nurse Practitioner

## 2018-01-26 ENCOUNTER — Ambulatory Visit: Payer: 59 | Admitting: Nurse Practitioner

## 2018-01-26 VITALS — BP 140/80 | HR 48 | Ht 70.0 in | Wt 174.0 lb

## 2018-01-26 DIAGNOSIS — K625 Hemorrhage of anus and rectum: Secondary | ICD-10-CM

## 2018-01-26 DIAGNOSIS — K648 Other hemorrhoids: Secondary | ICD-10-CM

## 2018-01-26 NOTE — Patient Instructions (Signed)
If you are age 80 or older, your body mass index should be between 23-30. Your Body mass index is 24.97 kg/m. If this is out of the aforementioned range listed, please consider follow up with your Primary Care Provider.  If you are age 12 or younger, your body mass index should be between 19-25. Your Body mass index is 24.97 kg/m. If this is out of the aformentioned range listed, please consider follow up with your Primary Care Provider.   Follow up as needed.  Thank you for choosing me and [facility].   [name], NP

## 2018-01-29 ENCOUNTER — Encounter: Payer: Self-pay | Admitting: Nurse Practitioner

## 2018-01-29 NOTE — Progress Notes (Signed)
Chief Complaint:    Recurrent hemorrhoidal bleeding in setting of constipation.   IMPRESSION and PLAN:      82 yo male with recurrent painless rectal bleeding in setting of recurrent constipation after stopping Miralax and Benefiber   -Bowels already moving well back on miralax.  -bleeding is improving. Swollen internal hemorrhoids on anoscopy just as before.  -offered a short course of steroid cream or he can even use Prep H for a week or so. Patient is actually more inclined to just wait it out as things are already improving.  -recommended patient now discontinue Miralax / Benefiber again just because he feels better.  -he will call for any questions / concerns.  HPI:     Patient is an 82 yo male who we have seen several times recently for evaluation of rectal bleeding. He has had some difficulty regulating BMs, specifically he has dealt with long, narrow stools. Patient has known several left sided diverticular disease with luminal narrowing. We have been working together to improve bowel movements and he was doing well on daily miralax until recently stopping it. Things were going so well that patient thought he may not need the miralax anymore.  Adding to the problem he was on pain medications. After stopping the miralax patient became constipated and the painless rectal bleeding soon followed.  He has already restarted the Miralax and his bowels are moving well again. The bleeding has already started to improve on its own. No abdominal pain. No other GI complaints.   Review of systems:     No chest pain, no SOB, no fevers, no urinary sx   Past Medical History:  Diagnosis Date  . Anxiety   . Colon polyp   . COLONIC POLYPS, ADENOMATOUS, HX OF 11/11/2003   Annotation: COLONOSCOPY 10/05 ([facility]) COLONOSCOPY 04/11/07 Qualifier: Diagnosis of  By: Carlean Purl MD, Dimas Millin Diverticulosis   . Gout   . Hx of adenomatous colonic polyps 01/12/2015  . Hyperlipidemia   .  Hypertension   . PAF (paroxysmal atrial fibrillation) ([place])    a. diagnosed in 12/2015 b. started on Eliquis for anticoagulation    Patient's surgical history, family medical history, social history, medications and allergies were all reviewed in Epic   Creatinine clearance cannot be calculated (Patient's most recent lab result is older than the maximum 21 days allowed.)  Current Outpatient Medications  Medication Sig Dispense Refill  . allopurinol (ZYLOPRIM) 100 MG tablet TAKE 1 TABLET (100 MG TOTAL) BY MOUTH DAILY. 90 tablet 3  . amLODipine (NORVASC) 2.5 MG tablet Take 1 tablet (2.5 mg total) by mouth daily. 90 tablet 3  . atenolol (TENORMIN) 50 MG tablet TAKE 1 TABLET (50 MG TOTAL) BY MOUTH 2 (TWO) TIMES DAILY. 180 tablet 3  . ibuprofen (ADVIL,MOTRIN) 200 MG tablet Take 200 mg by mouth every 8 (eight) hours as needed for moderate pain.     . Polyethylene Glycol 3350 (MIRALAX PO) Take by mouth daily.    . Wheat Dextrin (BENEFIBER DRINK MIX PO) Take by mouth daily.     No current facility-administered medications for this visit.     Physical Exam:     BP 140/80   Pulse (!) 48   Ht 5\' 10"  (1.778 m)   Wt 174 lb (78.9 kg)   BMI 24.97 kg/m   GENERAL:  Pleasant male in NAD PSYCH: : Cooperative, normal affect EENT:  conjunctiva pink, mucous membranes moist, neck supple without  masses CARDIAC:  RRR, , no peripheral edema PULM: Normal respiratory effort, lungs CTA bilaterally, no wheezing ABDOMEN:  Nondistended, soft, nontender. No obvious masses,  normal bowel sounds RECTAL: no obvious fissures. On anoscopy enlarged internal hemorrhoids seen again Musculoskeletal:  Normal muscle tone, normal strength NEURO: Alert and oriented x 3, no focal neurologic deficits   [name] , NP 01/29/2018, 10:55 PM

## 2018-02-24 IMAGING — DX DG SHOULDER 2+V*R*
3 series · 3 of 3 positions shown · non-contrast
Comparison: 12/02/2015 and prior chest radiographs

CLINICAL DATA: Chronic right shoulder pain for 8 months. Initial
encounter.

EXAM:
RIGHT SHOULDER - 2+ VIEW

[shoulder grashey ap]
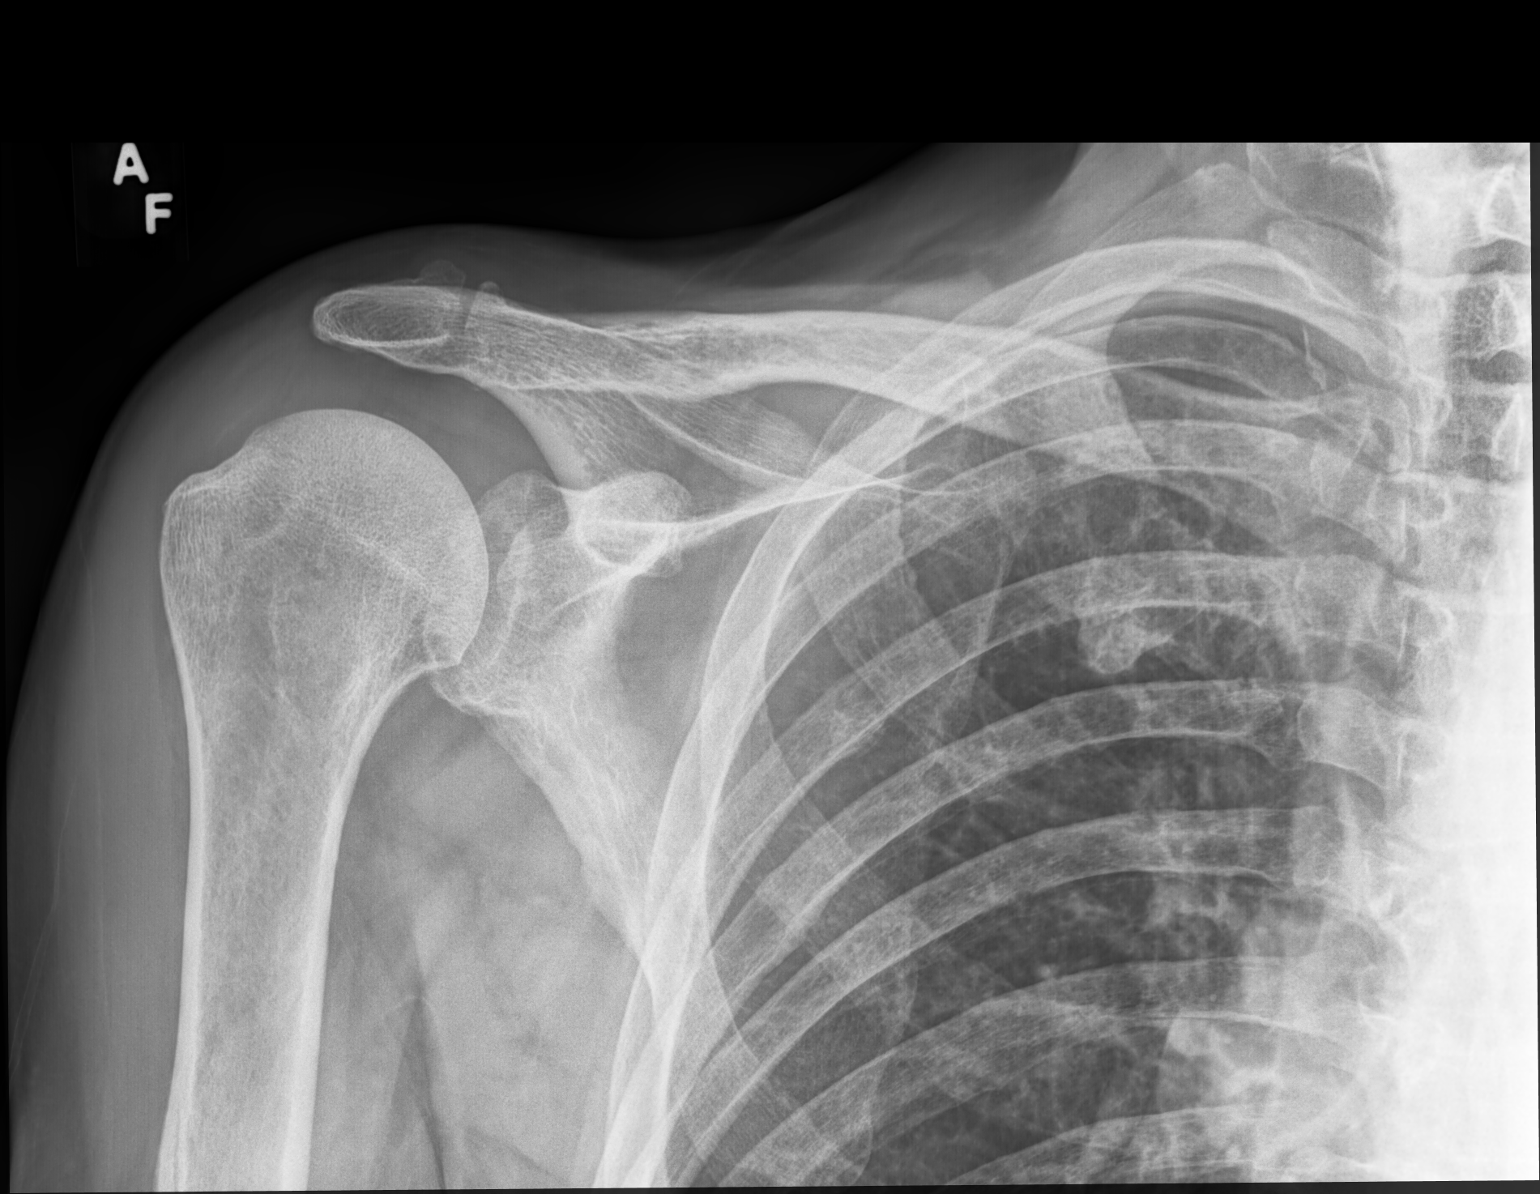

[shoulder y view]
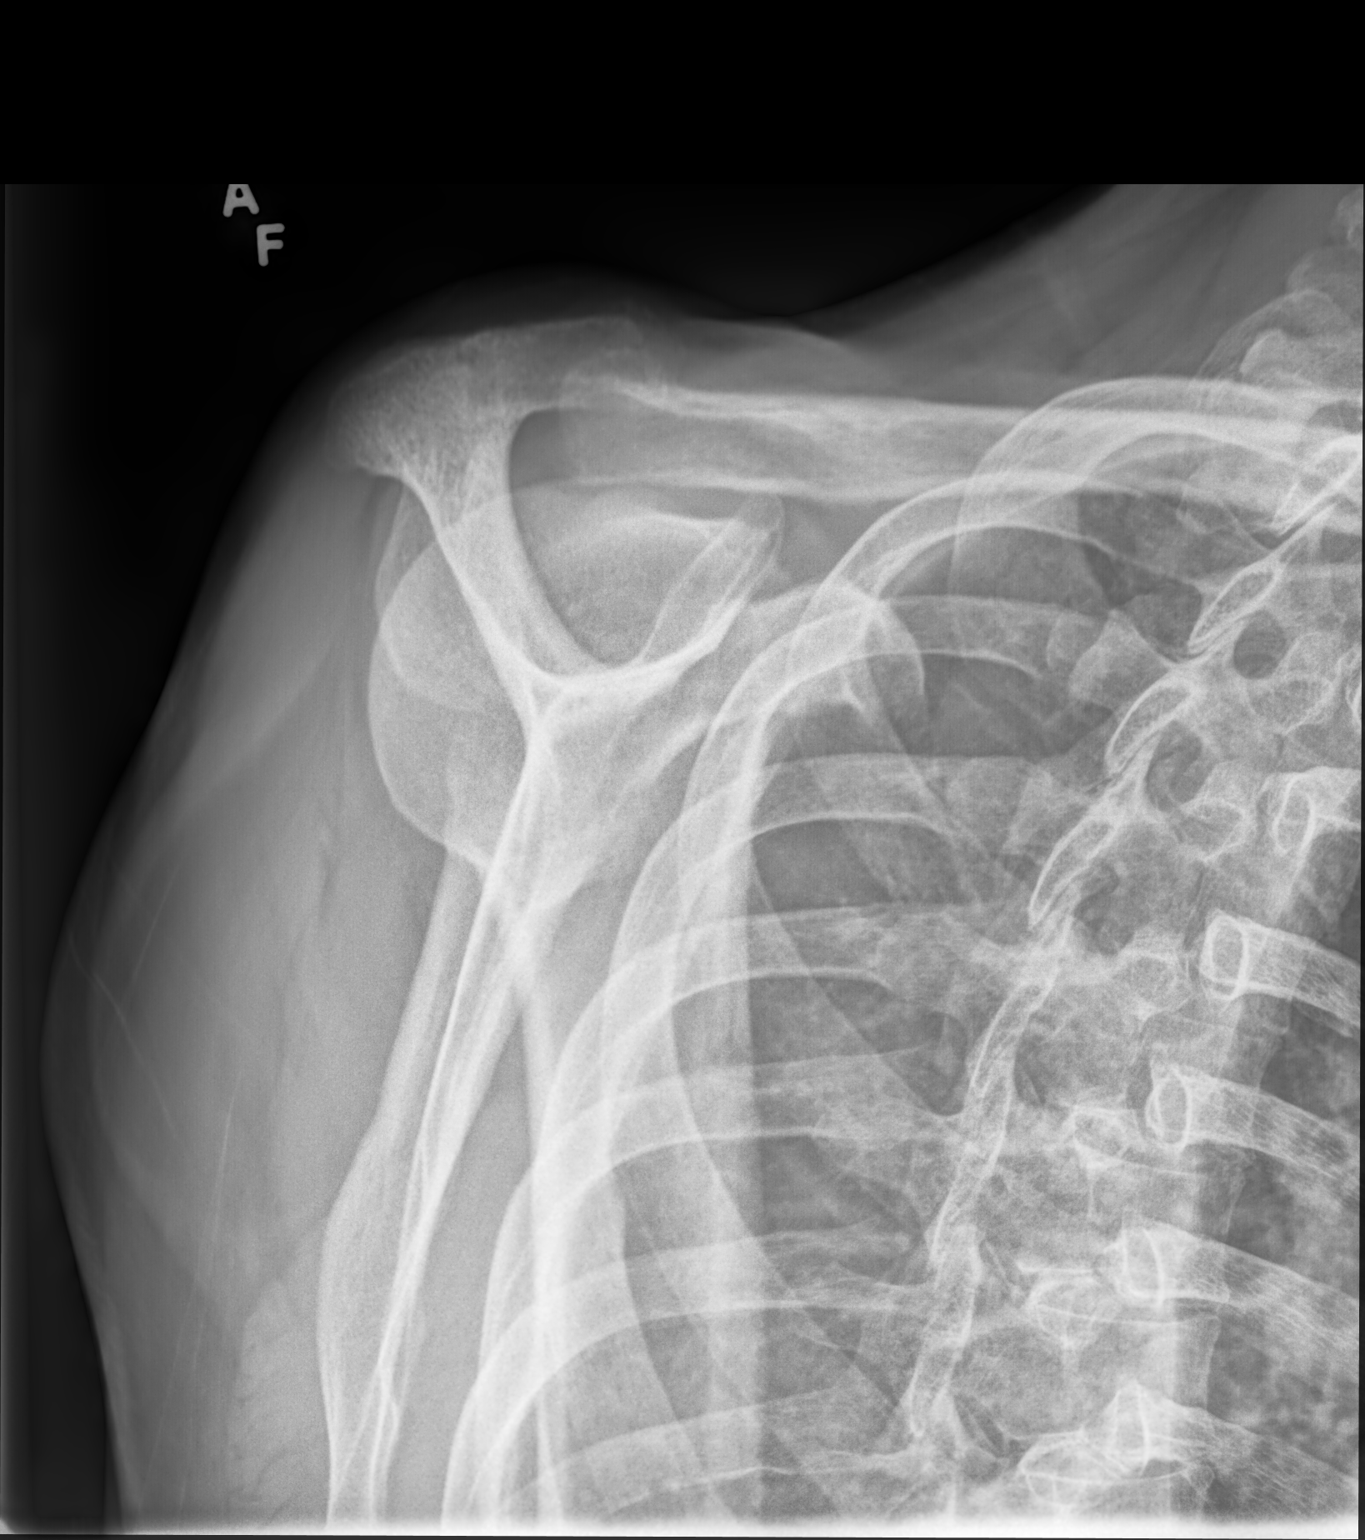

[shoulder axial]
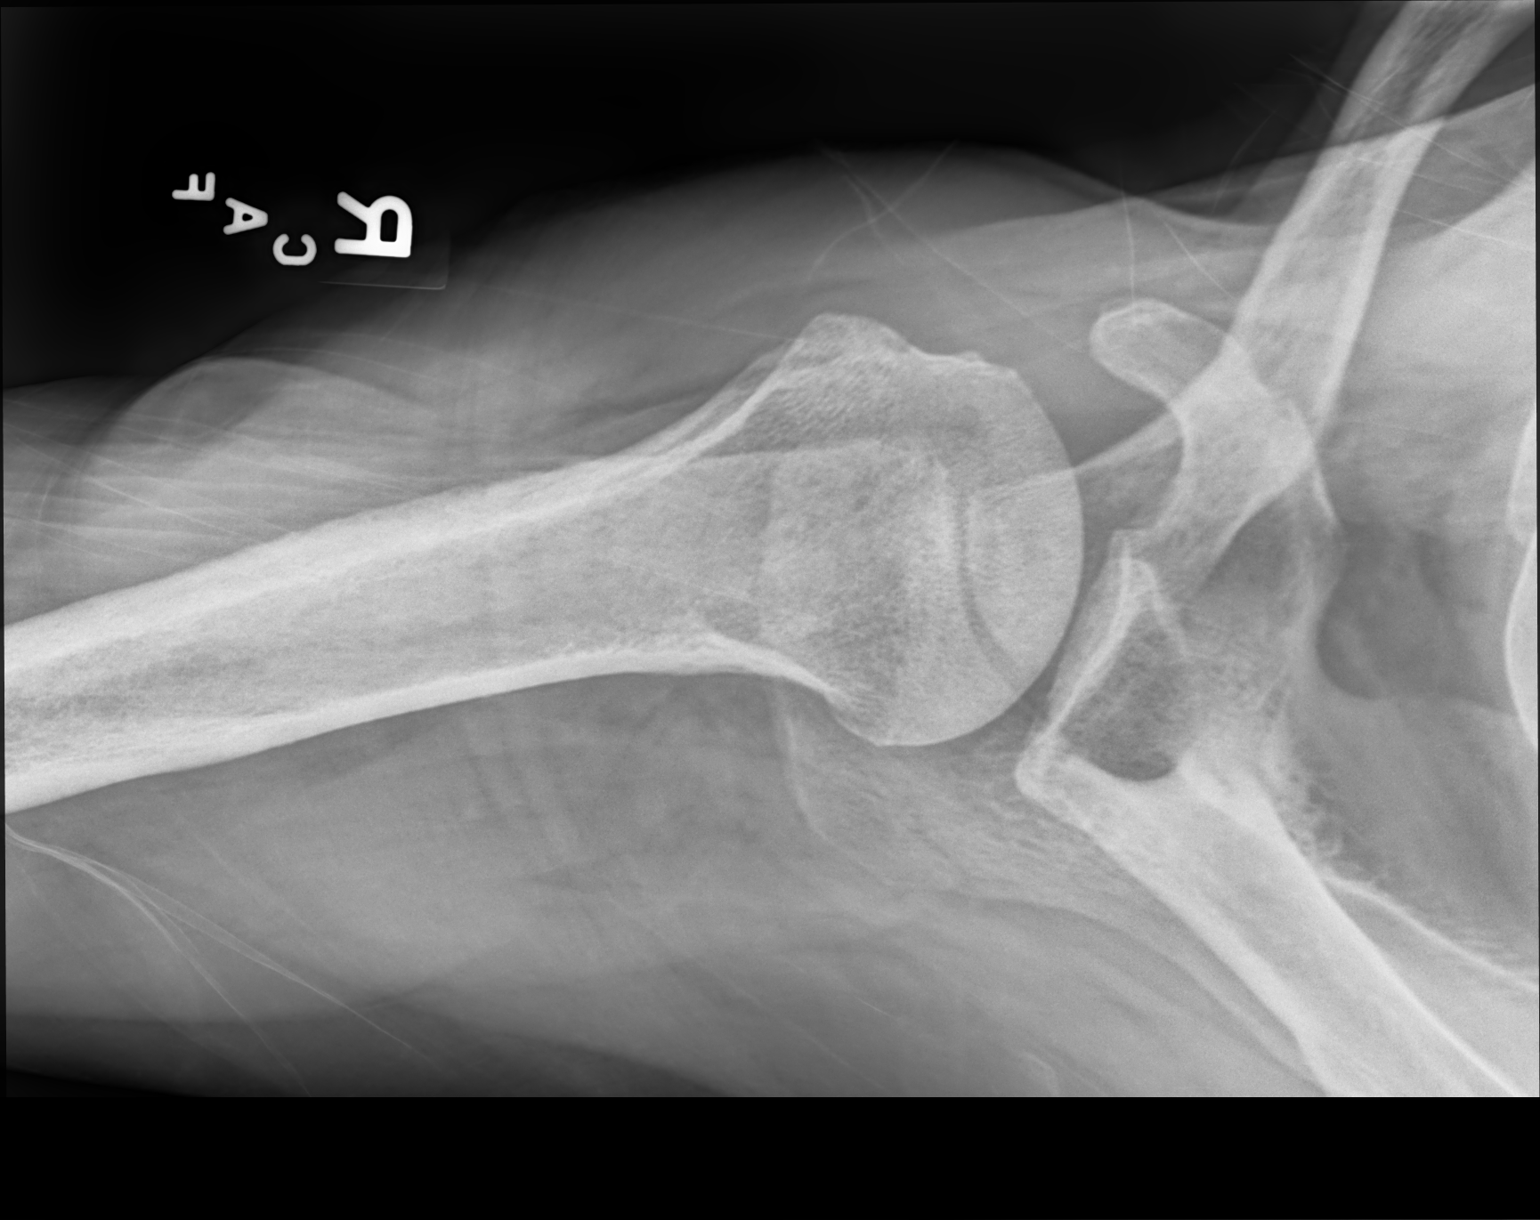

[3 of 3 positions shown; findings below may reference images not displayed]

FINDINGS: There is no evidence of fracture or dislocation. There is no
evidence of arthropathy or other focal bone abnormality. Soft
tissues are unremarkable.
IMPRESSION: Negative.

## 2018-04-03 ENCOUNTER — Other Ambulatory Visit: Payer: Self-pay | Admitting: Family Medicine

## 2018-04-03 NOTE — Telephone Encounter (Signed)
Copied from [place] [id number]. Topic: Quick Communication - Rx Refill/Question >> Apr 03, 2018 11:01 AM Mcneil, Ja-Kwan wrote: Medication: allopurinol (ZYLOPRIM) 100 MG tablet, amLODipine (NORVASC) 2.5 MG tablet, and atenolol (TENORMIN) 50 MG tablet  Has the patient contacted their pharmacy? yes   Preferred Pharmacy (with phone number or street name): [facility] [address] [place] - [place] [phone] (Phone)  [phone] (Fax)  Agent: Please be advised that RX refills may take up to 3 business days. We ask that you follow-up with your pharmacy.

## 2018-04-05 ENCOUNTER — Encounter: Payer: 59 | Admitting: Family Medicine

## 2018-05-10 ENCOUNTER — Telehealth: Payer: Self-pay

## 2018-05-10 NOTE — Telephone Encounter (Signed)
Tried to contact pt for ov since he has not been seen in a while. No answer on pt or spouse phone. Call pt work and the receptionist stated she will give pt the message.

## 2018-07-16 ENCOUNTER — Encounter: Payer: 59 | Admitting: Family Medicine

## 2018-08-23 ENCOUNTER — Other Ambulatory Visit: Payer: Self-pay | Admitting: Family Medicine

## 2018-09-24 ENCOUNTER — Other Ambulatory Visit: Payer: Self-pay

## 2018-09-24 ENCOUNTER — Encounter: Payer: Self-pay | Admitting: Family Medicine

## 2018-09-24 ENCOUNTER — Ambulatory Visit (INDEPENDENT_AMBULATORY_CARE_PROVIDER_SITE_OTHER): Payer: 59 | Admitting: Family Medicine

## 2018-09-24 VITALS — BP 126/74 | HR 56 | Temp 98.7°F | Resp 16 | Ht 70.0 in | Wt 170.0 lb

## 2018-09-24 DIAGNOSIS — Z23 Encounter for immunization: Secondary | ICD-10-CM | POA: Diagnosis not present

## 2018-09-24 DIAGNOSIS — E785 Hyperlipidemia, unspecified: Secondary | ICD-10-CM

## 2018-09-24 DIAGNOSIS — I480 Paroxysmal atrial fibrillation: Secondary | ICD-10-CM

## 2018-09-24 DIAGNOSIS — Z0000 Encounter for general adult medical examination without abnormal findings: Secondary | ICD-10-CM | POA: Diagnosis not present

## 2018-09-24 DIAGNOSIS — M1A9XX0 Chronic gout, unspecified, without tophus (tophi): Secondary | ICD-10-CM | POA: Diagnosis not present

## 2018-09-24 DIAGNOSIS — I700 Atherosclerosis of aorta: Secondary | ICD-10-CM

## 2018-09-24 DIAGNOSIS — I10 Essential (primary) hypertension: Secondary | ICD-10-CM

## 2018-09-24 MED ORDER — AMLODIPINE BESYLATE 2.5 MG PO TABS: 2.5000 mg | ORAL_TABLET | Freq: Every day | ORAL | 3 refills | Status: DC

## 2018-09-24 MED ORDER — ATENOLOL 25 MG PO TABS: ORAL_TABLET | ORAL | 3 refills | Status: DC

## 2018-09-24 MED ORDER — ATENOLOL 25 MG PO TABS: ORAL_TABLET | ORAL | 0 refills | Status: DC

## 2018-09-24 MED ORDER — AMLODIPINE BESYLATE 2.5 MG PO TABS: 2.5000 mg | ORAL_TABLET | Freq: Every day | ORAL | 0 refills | Status: DC

## 2018-09-24 MED ORDER — ALLOPURINOL 100 MG PO TABS: ORAL_TABLET | ORAL | 3 refills | Status: DC

## 2018-09-24 NOTE — Patient Instructions (Addendum)
Health Maintenance Due  Topic Date Due  . TETANUS/TDAP - recommend getting this at your pharmacy  06/11/2017  . INFLUENZA VACCINE - high dose flu shot today 09/01/2018   Reduce atenolol to 25mg  twice a day- keep an on blood pressure and heart rate with this change. See me back in 6 months to recheck Bp in person.   Please stop by lab before you go If you do not have mychart- we will call you about results within 5 business days of [place] receiving them.  If you have mychart- we will send your results within 3 business days of [place] receiving them.  If abnormal or we want to clarify a result, we will call or mychart you to make sure you receive the message.  If you have questions or concerns or don't hear within 5-7 days, please send [place] a message or call us.

## 2018-09-24 NOTE — Progress Notes (Signed)
Phone: [phone]   Subjective:  Patient presents today for their annual physical. Chief complaint-noted.   See problem oriented charting-  ROS- full  review of systems was completed and negative including No chest pain or shortness of breath. No headache or blurry vision.   The following were reviewed and entered/updated in epic: Past Medical History:  Diagnosis Date  . Anxiety   . Colon polyp   . COLONIC POLYPS, ADENOMATOUS, HX OF 11/11/2003   Annotation: COLONOSCOPY 10/05 ([facility]) COLONOSCOPY 04/11/07 Qualifier: Diagnosis of  By: Carlean Purl MD, Dimas Millin Diverticulosis   . Gout   . Hx of adenomatous colonic polyps 01/12/2015  . Hyperlipidemia   . Hypertension   . PAF (paroxysmal atrial fibrillation) ([place])    a. diagnosed in 12/2015 b. started on Eliquis for anticoagulation   Patient Active Problem List   Diagnosis Date Noted  . Syncope 01/06/2016    Priority: High  . PAF (paroxysmal atrial fibrillation) ([place]) 12/10/2015    Priority: High  . History of paroxysmal supraventricular tachycardia 12/03/2015    Priority: High  . Multiple rib fractures 11/30/2015    Priority: Medium  . Hyperlipidemia 10/25/2006    Priority: Medium  . Gout 10/25/2006    Priority: Medium  . Essential hypertension 10/25/2006    Priority: Medium  . History of skin cancer 06/13/2017    Priority: Low  . Hx of adenomatous colonic polyps 01/12/2015    Priority: Low  . Generalized anxiety disorder 11/27/2007    Priority: Low  . Aortic atherosclerosis ([place]) 12/01/2016  . Sternoclavicular (joint) (ligament) sprain, right, sequela 09/26/2016  . Scapular dyskinesis 09/26/2016  . Rotator cuff syndrome of right shoulder 09/26/2016   Past Surgical History:  Procedure Laterality Date  . COLONOSCOPY    . INGUINAL HERNIA REPAIR      Family History  Problem Relation Age of Onset  . Cancer Mother        lung  . Colon cancer Neg Hx     Medications- reviewed and updated Current Outpatient  Medications  Medication Sig Dispense Refill  . allopurinol (ZYLOPRIM) 100 MG tablet TAKE ONE TABLET BY MOUTH DAILY 90 tablet 3  . amLODipine (NORVASC) 2.5 MG tablet Take 1 tablet (2.5 mg total) by mouth daily. Wants small refill for travel- self pay. 30 tablet 0  . atenolol (TENORMIN) 25 MG tablet TAKE 1 TABLET (25 MG TOTAL) BY MOUTH 2 (TWO) TIMES DAILY. Wants small refill for travel- self pay. 30 tablet 0  . ibuprofen (ADVIL,MOTRIN) 200 MG tablet Take 200 mg by mouth every 8 (eight) hours as needed for moderate pain.     . Polyethylene Glycol 3350 (MIRALAX PO) Take by mouth daily.    . Wheat Dextrin (BENEFIBER DRINK MIX PO) Take by mouth daily.     No current facility-administered medications for this visit.     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 Oropharynx normal Neck: no thyromegaly or cervical lymphadenopathy CV: RRR no murmurs rubs or gallops Lungs: CTAB no crackles, wheeze, rhonchi Abdomen: soft/nontender/nondistended/normal bowel sounds. No rebound or guarding.  Ext: no edema and 2+ PT pulses Skin: warm, dry Neuro: grossly normal, moves all extremities, PERRLA   Assessment and Plan  83 y.o. male presenting for annual physical.  Health Maintenance counseling: 1. Anticipatory guidance: Patient counseled regarding regular dental exams -q6 months (has had extensive work recently with some implants), eye exams -yearly advised- states last visit a few years ago,   avoiding smoking and second hand smoke , limiting alcohol to 2 beverages per day - 2 oz of scotch per night.   2. Risk factor reduction:  Advised patient of need for regular exercise and diet rich and fruits and vegetables to reduce risk of heart attack and stroke. Exercise- considering getting back on rowing machine- right now doing a lot of yard work. Diet-reasonably healthy diet. Has enjoyed yogurt and blueberries Wt Readings from Last 3 Encounters:  09/24/18 170 lb (77.1 kg)  01/26/18 174 lb (78.9 kg)  11/20/17 172 lb (78 kg)  3. Immunizations/screenings/ancillary studies- high dose flu shot today. Advised TDap at pharmacy Immunization History  Administered Date(s) Administered  . Influenza Split 01/05/2011  . Influenza Whole 11/27/2007, 11/04/2008, 11/06/2009  . Influenza, High Dose Seasonal PF 11/07/2012, 12/01/2016, 10/03/2017  . Influenza,inj,Quad PF,6+ Mos 11/05/2013, 11/03/2014, 12/01/2015  . Pneumococcal Conjugate-13 06/18/2014  . Pneumococcal Polysaccharide-23 06/12/2007  . Td 06/12/2007  . Zoster 06/12/2007  4. Prostate cancer screening- passed age based screening  Lab Results  Component Value Date   PSA 0.29 10/29/2014   PSA 0.26 08/07/2013   PSA 0.22 06/05/2012   5. Colon cancer screening - Dr. Carlean Purl did last colonoscopy 01/06/2015 with no plan for repeat given age 83. Skin cancer screening- sees dermatology Dr. Wilhemina Bonito. advised regular sunscreen use- not the best with this but tries to wear a hat at least. Denies worrisome, changing, or new skin lesions.  7. fomer smoker- quit in 1970s. On ct abd pelvis 11/2015 with no AAA0- done after fall   Status of chronic or acute concerns  Hypertension - Taking Amlodipine 2.5 mg daly and Atenolol 50 mg BID. He prefers to reduce atenolol dose with low heart rate  A Fib - no recurrence since his fall of atrial fibrillation. Still on atenolol for rate control in case goes back into a fib. Has preferred to stay off  aspirin/coumadin/etc- see prior conversations  Hyperlipidemia /aortic atherosclerosis (noted on prior imaging)- hopefully both are stable. Improved with lifestyle changes in the past.   update lipids though past clear age where benefits of statins outweight risks  Gout - Taking Allopurinol 100 mg daily. No recent flares  Discussed possible referral back to cardiology based off of ct 12/16/15 - they referred to [facility]CTS and he did not see them either"1. Tiny area of thrombus along the wall of the transverse aorta is likely atherosclerotic in origin. 2. Mildly hypodense lesion in the dome of the liver cannot be characterized as simple cyst. A hemangioma is a possibility. MR abdomen without and with contrast could be performed in further evaluation, as clinically indicated."  1. He prefers to not return to cardiology or cardiothoracic surgery- is not keen on blood thinners if needed.  2. Declines follow up of Liver lesion as above- will at least allow LFT recheck  Recommended follow up: 6 months advised  Lab/Order associations:  Yogurt  and blue berries and several other things- not fasting    ICD-10-CM   1. Preventative health care  Z00.00   2. PAF (paroxysmal atrial fibrillation) (HCC)  I48.0   3. Chronic gout without tophus, unspecified cause, unspecified site  M1A.9XX0   4. Hyperlipidemia, unspecified hyperlipidemia type  E78.5   5. Essential hypertension  I10   6. Aortic atherosclerosis (HCC)  I70.0     Meds ordered this encounter  Medications  . DISCONTD: atenolol (TENORMIN) 25 MG tablet    Sig: TAKE 1 TABLET (25 MG TOTAL) BY MOUTH 2 (TWO) TIMES DAILY.    Dispense:  180 tablet    Refill:  3  . atenolol (TENORMIN) 25 MG tablet    Sig: TAKE 1 TABLET (25 MG TOTAL) BY MOUTH 2 (TWO) TIMES DAILY. Wants small refill for travel- self pay.    Dispense:  30 tablet    Refill:  0  . DISCONTD: amLODipine (NORVASC) 2.5 MG tablet    Sig: Take 1 tablet (2.5 mg total) by mouth daily.     Dispense:  90 tablet    Refill:  3  . amLODipine (NORVASC) 2.5 MG tablet    Sig: Take 1 tablet (2.5 mg total) by mouth daily. Wants small refill for travel- self pay.    Dispense:  30 tablet    Refill:  0  . allopurinol (ZYLOPRIM) 100 MG tablet    Sig: TAKE ONE TABLET BY MOUTH DAILY    Dispense:  90 tablet    Refill:  3    Return precautions advised.  Garret Reddish, MD

## 2018-09-24 NOTE — Addendum Note (Signed)
Addended by: Deveron Furlong D on: 09/24/2018 03:08 PM   Modules accepted: Orders

## 2018-11-08 ENCOUNTER — Ambulatory Visit: Payer: 59 | Admitting: Family Medicine

## 2018-11-08 ENCOUNTER — Encounter

## 2018-11-17 ENCOUNTER — Other Ambulatory Visit: Payer: Self-pay | Admitting: Family Medicine

## 2018-12-24 ENCOUNTER — Ambulatory Visit: Payer: 59 | Admitting: Family Medicine

## 2018-12-31 ENCOUNTER — Other Ambulatory Visit: Payer: Self-pay | Admitting: Family Medicine

## 2019-02-12 ENCOUNTER — Ambulatory Visit: Payer: 59

## 2019-02-26 ENCOUNTER — Ambulatory Visit (INDEPENDENT_AMBULATORY_CARE_PROVIDER_SITE_OTHER): Payer: 59 | Admitting: Family Medicine

## 2019-02-26 ENCOUNTER — Other Ambulatory Visit: Payer: Self-pay

## 2019-02-26 ENCOUNTER — Encounter: Payer: Self-pay | Admitting: Family Medicine

## 2019-02-26 VITALS — BP 136/80 | HR 64 | Temp 97.9°F | Ht 70.0 in | Wt 171.4 lb

## 2019-02-26 DIAGNOSIS — I700 Atherosclerosis of aorta: Secondary | ICD-10-CM

## 2019-02-26 DIAGNOSIS — I480 Paroxysmal atrial fibrillation: Secondary | ICD-10-CM

## 2019-02-26 DIAGNOSIS — E785 Hyperlipidemia, unspecified: Secondary | ICD-10-CM | POA: Diagnosis not present

## 2019-02-26 DIAGNOSIS — S61212A Laceration without foreign body of right middle finger without damage to nail, initial encounter: Secondary | ICD-10-CM | POA: Diagnosis not present

## 2019-02-26 DIAGNOSIS — Z23 Encounter for immunization: Secondary | ICD-10-CM

## 2019-02-26 DIAGNOSIS — I10 Essential (primary) hypertension: Secondary | ICD-10-CM | POA: Diagnosis not present

## 2019-02-26 DIAGNOSIS — M1A9XX0 Chronic gout, unspecified, without tophus (tophi): Secondary | ICD-10-CM | POA: Diagnosis not present

## 2019-02-26 NOTE — Assessment & Plan Note (Signed)
Episode after fall from 15 foot ladder. No obvious recurrence- wants to remain off anticoagulation. Is on atenolol if were to go into a fib which would provide rate control

## 2019-02-26 NOTE — Patient Instructions (Addendum)
Health Maintenance Due  Topic Date Due  . TETANUS/TDAP -today . Insurance may not cover- 2ould be up to  06/11/2017   Please stop by lab before you go If you do not have mychart- we will call you about results within 5 business days of [place] receiving them.  If you have mychart- we will send your results within 3 business days of [place] receiving them.  If abnormal or we want to clarify a result, we will call or mychart you to make sure you receive the message.  If you have questions or concerns or don't hear within 5-7 days, please send [place] a message or call us.    No changes today

## 2019-02-26 NOTE — Progress Notes (Signed)
Phone [phone] In person visit   Subjective:   Travis Pruitt is a 84 y.o. year old very pleasant male patient who presents for/with See problem oriented charting Chief Complaint  Patient presents with  . Hypertension  . Hyperlipidemia    This visit occurred during the SARS-CoV-2 public health emergency.  Safety protocols were in place, including screening questions prior to the visit, additional usage of staff PPE, and extensive cleaning of exam room while observing appropriate contact time as indicated for disinfecting solutions.   Past Medical History-  Patient Active Problem List   Diagnosis Date Noted  . Syncope 01/06/2016    Priority: High  . PAF (paroxysmal atrial fibrillation) ([place]) 12/10/2015    Priority: High  . History of paroxysmal supraventricular tachycardia 12/03/2015    Priority: High  . Multiple rib fractures 11/30/2015    Priority: Medium  . Hyperlipidemia 10/25/2006    Priority: Medium  . Gout 10/25/2006    Priority: Medium  . Essential hypertension 10/25/2006    Priority: Medium  . History of skin cancer 06/13/2017    Priority: Low  . Hx of adenomatous colonic polyps 01/12/2015    Priority: Low  . Generalized anxiety disorder 11/27/2007    Priority: Low  . Aortic atherosclerosis ([place]) 12/01/2016  . Sternoclavicular (joint) (ligament) sprain, right, sequela 09/26/2016  . Scapular dyskinesis 09/26/2016  . Rotator cuff syndrome of right shoulder 09/26/2016    Medications- reviewed and updated Current Outpatient Medications  Medication Sig Dispense Refill  . allopurinol (ZYLOPRIM) 100 MG tablet TAKE ONE TABLET BY MOUTH DAILY 90 tablet 3  . amLODipine (NORVASC) 2.5 MG tablet TAKE ONE TABLET BY MOUTH DAILY 90 tablet 2  . atenolol (TENORMIN) 50 MG tablet TAKE ONE TABLET BY MOUTH TWICE A DAY 180 tablet 2  . ibuprofen (ADVIL,MOTRIN) 200 MG tablet Take 200 mg by mouth every 8 (eight) hours as needed for moderate pain.     . Polyethylene Glycol  3350 (MIRALAX PO) Take by mouth daily.    . Wheat Dextrin (BENEFIBER DRINK MIX PO) Take by mouth daily.     No current facility-administered medications for this visit.     Objective:  BP 136/80   Pulse 64   Temp 97.9 F (36.6 C)   Ht 5\' 10"  (1.778 m)   Wt 171 lb 6.4 oz (77.7 kg)   SpO2 94%   BMI 24.59 kg/m  Gen: NAD, resting comfortably CV: RRR no murmurs rubs or gallops Lungs: CTAB no crackles, wheeze, rhonchi Abdomen: soft/nontender/nondistended/normal bowel sounds. No rebound or guarding.  Ext: no edema Skin: warm, dry Neuro: grossly normal, moves all extremities Msk: laceration 3rd middle finger - occurred 3 weeks ago- appears to be healing.     Assessment and Plan   #hypertension S: compliant with atenolol 50MG  twice a day, amlodidpine 2.5MG . he tried 25 mg atenolol wice a day but BP up in 160s range or 150s. Improved to 135/75 on average when increased back to 50mg  twice a day BP Readings from Last 3 Encounters:  02/26/19 136/80  09/24/18 126/74  01/26/18 140/80  A/P: Stable. Continue current medications.   #hyperlipidemia #aortic atherosclerosis- incicental finding prior imaging S: compliant with diet and exercise (yard work)-not currently on medication Lab Results  Component Value Date   CHOL 166 12/29/2016   HDL 51.60 12/29/2016   LDLCALC 93 12/29/2016   TRIG 108.0 12/29/2016   CHOLHDL 3 12/29/2016   A/P: mild poor control in light of aortic atherosclerosis- we will update  lipids- discussed ideal LDL under 70- he is opposed to statins unless has history of heart attack or stroke in future- which we both certainly hope to avoid  #Gout S: 0 flares in last 6 months on allopurinol 100 mg Lab Results  Component Value Date   LABURIC 6.0 12/29/2016  A/P:stable- update uric acid level    Reviewed from last visit and he states still would not want surgery even if needed so less worth time/energy/finances to do more workup if would not change management  Form  last note "Discussed possible referral back to cardiology based off of ct 12/16/15 - they referred to [facility] and he did not see them either"1. Tiny area of thrombus along the wall of the transverse aorta is likely atherosclerotic in origin. 2. Mildly hypodense lesion in the dome of the liver cannot be characterized as simple cyst. A hemangioma is a possibility. MR abdomen without and with contrast could be performed in further evaluation, as clinically indicated."  1. He prefers to not return to cardiology or cardiothoracic surgery- is not keen on blood thinners if needed.  2. Declines follow up of Liver lesion as above- will at least allow LFT recheck"  # right shoulder pain- states nonfactor after prior workup with Dr. Paulla Fore. Wants his wife to see him- gave info new practice  PAF (paroxysmal atrial fibrillation) ([place]) Episode after fall from 15 foot ladder. No obvious recurrence- wants to remain off anticoagulation. Is on atenolol if were to go into a fib which would provide rate control  #Tdap was given by team- had laceration of right middle finger. couldbe up to $77 but hopeful no charge to patient given injury. Went and saw [place] derm and they stated didn't need to be closed at the point he saw them.   #wait list for covid 19- has to be 2 weeks out from tetanus shot at least- advised today   Recommended follow up: physical in september Future Appointments  Date Time Provider [place]  03/04/2019  1:30 PM Willia Craze, NP [facility]  10/14/2019  1:00 PM Marin Olp, MD [facility]   Lab/Order associations: not fasting   ICD-10-CM   1. Hyperlipidemia, unspecified hyperlipidemia type  E78.5 CBC with Differential/Platelet    Comprehensive metabolic panel    Lipid panel  2. Chronic gout without tophus, unspecified cause, unspecified site  M1A.9XX0 Uric acid  3. Essential hypertension  I10 CBC with Differential/Platelet    Comprehensive metabolic panel     Lipid panel  4. PAF (paroxysmal atrial fibrillation) (HCC)  I48.0   5. Need for Tdap vaccination  Z23 Tdap vaccine greater than or equal to 7yo IM  6. Aortic atherosclerosis (HCC) Chronic I70.0   7. Laceration of right middle finger without foreign body without damage to nail, initial encounter  S61.212A Tdap vaccine greater than or equal to 7yo IM   Return precautions advised.  Garret Reddish, MD

## 2019-02-27 LAB — COMPREHENSIVE METABOLIC PANEL
ALT: 11 U/L (ref 0–53)
AST: 13 U/L (ref 0–37)
Albumin: 3.9 g/dL (ref 3.5–5.2)
Alkaline Phosphatase: 51 U/L (ref 39–117)
BUN: 26 mg/dL — ABNORMAL HIGH (ref 6–23)
CO2: 28 mEq/L (ref 19–32)
Calcium: 9.3 mg/dL (ref 8.4–10.5)
Chloride: 104 mEq/L (ref 96–112)
Creatinine, Ser: 0.94 mg/dL (ref 0.40–1.50)
GFR: 77 mL/min (ref >60.00)
Glucose, Bld: 93 mg/dL (ref 70–99)
Potassium: 5.1 mEq/L (ref 3.5–5.1)
Sodium: 140 mEq/L (ref 135–145)
Total Bilirubin: 0.5 mg/dL (ref 0.2–1.2)
Total Protein: 6.1 g/dL (ref 6.0–8.3)

## 2019-02-27 LAB — CBC WITH DIFFERENTIAL/PLATELET
Basophils Absolute: 0.1 10*3/uL (ref 0.0–0.1)
Basophils Relative: 1 % (ref 0.0–3.0)
Eosinophils Absolute: 0.2 10*3/uL (ref 0.0–0.7)
Eosinophils Relative: 1.9 % (ref 0.0–5.0)
HCT: 49.7 % (ref 39.0–52.0)
Hemoglobin: 16.6 g/dL (ref 13.0–17.0)
Lymphocytes Relative: 20.5 % (ref 12.0–46.0)
Lymphs Abs: 1.6 10*3/uL (ref 0.7–4.0)
MCHC: 33.3 g/dL (ref 30.0–36.0)
MCV: 96.3 fl (ref 78.0–100.0)
Monocytes Absolute: 0.6 10*3/uL (ref 0.1–1.0)
Monocytes Relative: 7.7 % (ref 3.0–12.0)
Neutro Abs: 5.4 10*3/uL (ref 1.4–7.7)
Neutrophils Relative %: 68.9 % (ref 43.0–77.0)
Platelets: 178 10*3/uL (ref 150.0–400.0)
RBC: 5.17 Mil/uL (ref 4.22–5.81)
RDW: 13 % (ref 11.5–15.5)
WBC: 7.9 10*3/uL (ref 4.0–10.5)

## 2019-02-27 LAB — LIPID PANEL
Cholesterol: 162 mg/dL (ref 0–200)
HDL: 53.3 mg/dL (ref >39.00)
LDL Cholesterol: 92 mg/dL (ref 0–99)
NonHDL: 109.18
Total CHOL/HDL Ratio: 3
Triglycerides: 88 mg/dL (ref 0.0–149.0)
VLDL: 17.6 mg/dL (ref 0.0–40.0)

## 2019-02-27 LAB — URIC ACID: Uric Acid, Serum: 5.7 mg/dL (ref 4.0–7.8)

## 2019-03-04 ENCOUNTER — Encounter: Payer: Self-pay | Admitting: Nurse Practitioner

## 2019-03-04 ENCOUNTER — Ambulatory Visit: Payer: 59 | Admitting: Nurse Practitioner

## 2019-03-04 VITALS — BP 124/70 | HR 60 | Temp 98.4°F | Ht 70.0 in | Wt 173.0 lb

## 2019-03-04 DIAGNOSIS — R58 Hemorrhage, not elsewhere classified: Secondary | ICD-10-CM | POA: Diagnosis not present

## 2019-03-04 DIAGNOSIS — K5900 Constipation, unspecified: Secondary | ICD-10-CM | POA: Diagnosis not present

## 2019-03-04 NOTE — Progress Notes (Signed)
IMPRESSION and PLAN:    84 yo male with recurrent minor rectal bleeding after altering bowel regimen. He cut fiber and miralax dosages in half.  -reassurance provided. We reviewed last colonoscopy report -better in his case to keep stools on softer side as they were before reduction in fiber and miralax.  Marland Kitchen He is interested in reducing number of soft stools / day ( has 2-3 a day). Recommended he resume the full dose of Fiber. He will try keeping Miralax at 1/2 capful but if stools don't soften then will resume full capful of Miralax daily -Known hemorrhoids, suspect source of minor bleeding. We discussed trial of prep H if bleeding persists. Following that, if bleeding still persists then he will let us know. Marland Kitchen       HPI:    Primary GI: Dr. Carlean Purl  Chief complaint : bowel changes and some blood when wiping.    Patient is an 84 yo male who we have been following for constipation and hemorrhoids associated with minor rectal bleeding. He was last seen Dec 2019 for rectal bleeding. Anoscopy again showed swollen internal hemorrhoids. Things were already starting to improve at that time. He has since done well on Miralax + fiber. Stools tend to be on soft side 2-3 times a day. A few weeks ago he decided to cut dosages of both by 1/2 in an effort to decrease number of stools and maybe make then less soft. Following that adjustment his stools changed from being soft to what he refers to as being a normal consistency but he since developed recurrent minor bleeding. Describes blood as small amount, mainly on tissue. The bleeding doesn't bother him he just wanted to be checked out to make sure everything was okay. No abdominal pain. No other GI complaints.  His last colonoscopy was Dec 2016 remarkable for severe left sided diverticular disease with associated luminal narrowing. Two small adenomas removed, recall colonoscopy not recommended. He has been evaluated by Dr. Carlean Purl who didn't feel  internal hemorrhoidal banding would be beneficial.    Review of systems:     No chest pain, no SOB, no fevers, no urinary sx   Past Medical History:  Diagnosis Date  . Anxiety   . Colon polyp   . COLONIC POLYPS, ADENOMATOUS, HX OF 11/11/2003   Annotation: COLONOSCOPY 10/05 ([facility]) COLONOSCOPY 04/11/07 Qualifier: Diagnosis of  By: Carlean Purl MD, Dimas Millin Diverticulosis   . Gout   . Hx of adenomatous colonic polyps 01/12/2015  . Hyperlipidemia   . Hypertension   . PAF (paroxysmal atrial fibrillation) ([place])    a. diagnosed in 12/2015 b. started on Eliquis for anticoagulation    Patient's surgical history, family medical history, social history, medications and allergies were all reviewed in Epic   Serum creatinine: 0.94 mg/dL 02/26/19 1514 Estimated creatinine clearance: 63.6 mL/min  Current Outpatient Medications  Medication Sig Dispense Refill  . allopurinol (ZYLOPRIM) 100 MG tablet TAKE ONE TABLET BY MOUTH DAILY 90 tablet 3  . amLODipine (NORVASC) 2.5 MG tablet TAKE ONE TABLET BY MOUTH DAILY 90 tablet 2  . atenolol (TENORMIN) 50 MG tablet TAKE ONE TABLET BY MOUTH TWICE A DAY 180 tablet 2  . ibuprofen (ADVIL,MOTRIN) 200 MG tablet Take 200 mg by mouth every 8 (eight) hours as needed for moderate pain.     . Polyethylene Glycol 3350 (MIRALAX PO) Take by mouth daily.    . Wheat Dextrin (BENEFIBER  DRINK MIX PO) Take by mouth daily.     No current facility-administered medications for this visit.    Physical Exam:     BP 124/70   Pulse 60   Temp 98.4 F (36.9 C)   Ht 5\' 10"  (1.778 m)   Wt 173 lb (78.5 kg)   BMI 24.82 kg/m   GENERAL:  Pleasant male in NAD PSYCH: : Cooperative, normal affect CARDIAC:  RRR, no murmur heard, no peripheral edema PULM: Normal respiratory effort, lungs CTA bilaterally, no wheezing ABDOMEN:  Nondistended, soft, nontender. No obvious masses, no hepatomegaly,  normal bowel sounds RECTAL: several perianal comedones. Fleshy perianal  hemorrhoidal tags. No masses felt on DRE. No stool or blood in vault Musculoskeletal:  Normal muscle tone, normal strength NEURO: Alert and oriented x 3, no focal neurologic deficits  I spent 30 minutes total time reviewing records, obtaining history, performing exam, counseling patient and documenting visit / findings.   [name] , NP 03/04/2019, 2:24 PM

## 2019-03-04 NOTE — Patient Instructions (Addendum)
If you are age 82 or older, your body mass index should be between 23-30. Your Body mass index is 24.82 kg/m. If this is out of the aforementioned range listed, please consider follow up with your Primary Care Provider.  If you are age 12 or younger, your body mass index should be between 19-25. Your Body mass index is 24.82 kg/m. If this is out of the aformentioned range listed, please consider follow up with your Primary Care Provider.   Use Preparation H inside rectum at bedtime for 7 days. (this is over-the-counter)  Go back to full strength fiber.  Miralax 1 capful daily.  Try continuing the Miralax at 1/2 dose.  If needed, you can increase back to full strength.  Call if bleeding continues.  Thank you for choosing me and [facility].   [name], NP

## 2019-03-14 ENCOUNTER — Ambulatory Visit: Payer: 59 | Attending: Internal Medicine

## 2019-03-14 DIAGNOSIS — Z23 Encounter for immunization: Secondary | ICD-10-CM | POA: Insufficient documentation

## 2019-03-14 NOTE — Progress Notes (Signed)
   Covid-19 Vaccination Clinic  Name:  Travis Pruitt    MRN: [id number] DOB: 05-04-1935  03/14/2019  [name] was observed post Covid-19 immunization for 15 minutes without incidence. He was provided with Vaccine Information Sheet and instruction to access the V-Safe system.   [name] was instructed to call 911 with any severe reactions post vaccine: Marland Kitchen Difficulty breathing  . Swelling of your face and throat  . A fast heartbeat  . A bad rash all over your body  . Dizziness and weakness    Immunizations Administered    Name Date Dose VIS Date Route   Pfizer COVID-19 Vaccine 03/14/2019  1:59 PM 0.3 mL 01/11/2019 Intramuscular   Manufacturer: [employer], [employer]   Lot: [id number]   [place]: [id number]

## 2019-04-06 ENCOUNTER — Ambulatory Visit: Payer: 59 | Attending: Internal Medicine

## 2019-04-06 DIAGNOSIS — Z23 Encounter for immunization: Secondary | ICD-10-CM | POA: Insufficient documentation

## 2019-04-06 NOTE — Progress Notes (Signed)
   Covid-19 Vaccination Clinic  Name:  Travis Pruitt    MRN: [id number] DOB: 1935/03/05  04/06/2019  [name] was observed post Covid-19 immunization for 15 minutes without incident. He was provided with Vaccine Information Sheet and instruction to access the V-Safe system.   [name] was instructed to call 911 with any severe reactions post vaccine: Marland Kitchen Difficulty breathing  . Swelling of face and throat  . A fast heartbeat  . A bad rash all over body  . Dizziness and weakness   Immunizations Administered    Name Date Dose VIS Date Route   Pfizer COVID-19 Vaccine 04/06/2019 11:06 AM 0.3 mL 01/11/2019 Intramuscular   Manufacturer: [place]   Lot: [id number]   [place]: [id number]

## 2019-04-23 ENCOUNTER — Telehealth: Payer: Self-pay | Admitting: Family Medicine

## 2019-04-23 NOTE — Telephone Encounter (Signed)
Left message for patient to call back and schedule Medicare Annual Wellness Visit (AWV) either virtually/audio only OR in office. Whatever the patients preference is.  No hx; please schedule at anytime with [facility] at [facility].

## 2019-04-23 NOTE — Telephone Encounter (Signed)
Patient return call but declined AWV appt

## 2019-08-07 ENCOUNTER — Other Ambulatory Visit: Payer: Self-pay | Admitting: Family Medicine

## 2019-10-01 ENCOUNTER — Ambulatory Visit: Payer: 59 | Admitting: Family Medicine

## 2019-10-14 ENCOUNTER — Ambulatory Visit (INDEPENDENT_AMBULATORY_CARE_PROVIDER_SITE_OTHER): Payer: 59 | Admitting: Family Medicine

## 2019-10-14 ENCOUNTER — Other Ambulatory Visit: Payer: Self-pay

## 2019-10-14 ENCOUNTER — Encounter: Payer: Self-pay | Admitting: Family Medicine

## 2019-10-14 VITALS — BP 122/74 | HR 53 | Temp 98.1°F | Resp 18 | Ht 70.0 in | Wt 172.4 lb

## 2019-10-14 DIAGNOSIS — Z23 Encounter for immunization: Secondary | ICD-10-CM

## 2019-10-14 DIAGNOSIS — I480 Paroxysmal atrial fibrillation: Secondary | ICD-10-CM

## 2019-10-14 DIAGNOSIS — E785 Hyperlipidemia, unspecified: Secondary | ICD-10-CM

## 2019-10-14 DIAGNOSIS — Z0000 Encounter for general adult medical examination without abnormal findings: Secondary | ICD-10-CM | POA: Diagnosis not present

## 2019-10-14 MED ORDER — ROSUVASTATIN CALCIUM 10 MG PO TABS: 10.0000 mg | ORAL_TABLET | ORAL | 3 refills | Status: DC

## 2019-10-14 NOTE — Progress Notes (Signed)
Phone: [phone]   Subjective:  Patient presents today for their annual physical. Chief complaint-noted.   See problem oriented charting- Review of Systems  Constitutional: Negative for chills and fever.  HENT: Negative for nosebleeds and sore throat.   Eyes: Negative for blurred vision and double vision.  Respiratory: Negative for cough and shortness of breath.   Cardiovascular: Negative for chest pain and palpitations.  Gastrointestinal: Negative for constipation, diarrhea, nausea and vomiting.  Genitourinary: Negative for dysuria and frequency.  Musculoskeletal: Negative for back pain and myalgias.  Skin: Negative for itching and rash.  Neurological: Negative for dizziness and headaches.  Endo/Heme/Allergies: Negative for polydipsia. Does not bruise/bleed easily.  Psychiatric/Behavioral: Negative for depression and suicidal ideas.   The following were reviewed and entered/updated in epic: Past Medical History:  Diagnosis Date  . Anxiety   . Colon polyp   . COLONIC POLYPS, ADENOMATOUS, HX OF 11/11/2003   Annotation: COLONOSCOPY 10/05 ([facility]) COLONOSCOPY 04/11/07 Qualifier: Diagnosis of  By: Carlean Purl MD, Dimas Millin Diverticulosis   . Gout   . Hx of adenomatous colonic polyps 01/12/2015  . Hyperlipidemia   . Hypertension   . PAF (paroxysmal atrial fibrillation) ([place])    a. diagnosed in 12/2015 b. started on Eliquis for anticoagulation   Patient Active Problem List   Diagnosis Date Noted  . Syncope 01/06/2016    Priority: High  . PAF (paroxysmal atrial fibrillation) ([place]) 12/10/2015    Priority: High  . History of paroxysmal supraventricular tachycardia 12/03/2015    Priority: High  . Multiple rib fractures 11/30/2015    Priority: Medium  . Hyperlipidemia 10/25/2006    Priority: Medium  . Gout 10/25/2006    Priority: Medium  . Essential hypertension 10/25/2006    Priority: Medium  . History of skin cancer 06/13/2017    Priority: Low  . Hx of adenomatous  colonic polyps 01/12/2015    Priority: Low  . Generalized anxiety disorder 11/27/2007    Priority: Low  . Aortic atherosclerosis ([place]) 12/01/2016  . Sternoclavicular (joint) (ligament) sprain, right, sequela 09/26/2016  . Scapular dyskinesis 09/26/2016  . Rotator cuff syndrome of right shoulder 09/26/2016   Past Surgical History:  Procedure Laterality Date  . COLONOSCOPY    . INGUINAL HERNIA REPAIR      Family History  Problem Relation Age of Onset  . Cancer Mother        lung  . Colon cancer Neg Hx     Medications- reviewed and updated Current Outpatient Medications  Medication Sig Dispense Refill  . allopurinol (ZYLOPRIM) 100 MG tablet TAKE ONE TABLET BY MOUTH DAILY 90 tablet 3  . amLODipine (NORVASC) 2.5 MG tablet TAKE ONE TABLET BY MOUTH DAILY 90 tablet 2  . atenolol (TENORMIN) 50 MG tablet TAKE ONE TABLET BY MOUTH TWICE A DAY 180 tablet 1  . ibuprofen (ADVIL,MOTRIN) 200 MG tablet Take 200 mg by mouth every 8 (eight) hours as needed for moderate pain.     . Polyethylene Glycol 3350 (MIRALAX PO) Take by mouth daily.    . Wheat Dextrin (BENEFIBER DRINK MIX PO) Take by mouth daily.    . rosuvastatin (CRESTOR) 10 MG tablet Take 1 tablet (10 mg total) by mouth once a week. 13 tablet 3   No current facility-administered medications for this visit.    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 Oropharynx normal Neck: no thyromegaly CV: RRR no murmurs rubs or gallops Lungs: CTAB no crackles, wheeze, rhonchi Abdomen:  soft/nontender/nondistended/normal bowel sounds. No rebound or guarding.  Ext: no edema Skin: warm, dry Neuro: grossly normal, moves all extremities, PERRLA     Assessment and Plan  84 y.o. male presenting for annual physical.  Health Maintenance counseling: 1. Anticipatory guidance: Patient counseled regarding regular dental exams-q6 months, eye exams yearly advised- he declines for now,  avoiding smoking and second hand smoke, limiting alcohol to 2 beverages per day- 2 oz scotch at night.   2. Risk factor reduction:  Advised patient of need for regular exercise and diet rich and fruits and vegetables to reduce risk of heart attack and stroke. Exercise-active in his yard. Purchased rowing machine and plans to start using . Diet-He eats 2 pieces of toast for breakfast, blueberries, yogurt and turkey/cheese for lunch, enjoys cooking for dinner  Wt Readings from Last 3 Encounters:  10/14/19 172 lb 6.4 oz (78.2 kg)  03/04/19 173 lb (78.5 kg)  02/26/19 171 lb 6.4 oz (77.7 kg)  3. Immunizations/screenings/ancillary studies- high dose flu shot today. Declines shingrix for now Immunization History  Administered Date(s) Administered  . Fluad Quad(high Dose 65+) 09/24/2018  . Influenza Split 01/05/2011  . Influenza Whole 11/27/2007, 11/04/2008, 11/06/2009  . Influenza, High Dose Seasonal PF 11/07/2012, 12/01/2016, 10/03/2017  . Influenza,inj,Quad PF,6+ Mos 11/05/2013, 11/03/2014, 12/01/2015  . PFIZER SARS-COV-2 Vaccination 03/14/2019, 04/06/2019  . Pneumococcal Conjugate-13 06/18/2014  . Pneumococcal Polysaccharide-23 06/12/2007  . Td 06/12/2007  . Tdap 02/26/2019  . Zoster 06/12/2007  4. Prostate cancer screening- Last done on 10/29/2014- - denies change in urinary symptoms Lab Results  Component Value Date   PSA 0.29 10/29/2014   PSA 0.26 08/07/2013   PSA 0.22 06/05/2012   5. Colon cancer screening - 01/06/2015 with Dr. Carlean Purl with no further colonoscopy due to age.  6. Skin cancer  screening- Dr. Wilhemina Bonito every 6 months due to dad with melanoma. advised regular sunscreen use. Denies worrisome, changing, or new skin lesions.  7. Former smoker- quit in 1970s. No AAA on ct abd/pelvis 11/2015 8. STD screening - monogamous    Status of chronic or acute concerns   Hypertension- taking amlodipine 2.5mg  and atenolol 50mg  BID- continue current meds  A fib- no reucrrence since fall from ladder in 2017. Still on atenolol for rate control if he goes back into a fib. No obvious recurene. Prefers to stay off aspirin/coumadin/etc- see prior conversations.   Hyperlipidemia/aortic atherosclerosis (noted on prior imaging). 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 Uric acid at goal last visit Lab Results  Component Value Date   LABURIC 5.7 02/26/2019   Recommended follow up: Return in about 6 months (around 04/12/2020) for follow up- or sooner if needed.  Lab/Order associations:Non fasting   ICD-10-CM   1. Preventative health care  Z00.00 CBC With Differential/Platelet    COMPLETE METABOLIC PANEL WITH GFR    Lipid Panel (Refl)  2. Hyperlipidemia, unspecified hyperlipidemia type  E78.5 CBC With Differential/Platelet    COMPLETE METABOLIC PANEL WITH GFR    Lipid Panel (Refl)  3. PAF (paroxysmal atrial fibrillation) (HCC)  I48.0     Meds ordered this encounter  Medications  . rosuvastatin (CRESTOR) 10 MG  tablet    Sig: Take 1 tablet (10 mg total) by mouth once a week.    Dispense:  13 tablet    Refill:  3    Return precautions advised.  Garret Reddish, MD

## 2019-10-14 NOTE — Patient Instructions (Addendum)
Health Maintenance Due  Topic Date Due  . INFLUENZA VACCINE - high dose flu shot 09/01/2019   Consider shingrix/shingles shot at pharmacy at a later date  Start rosuvastatin 10mg  once a week  Please stop by lab before you go If you have mychart- we will send your results within 3 business days of [place] receiving them.  If you do not have mychart- we will call you about results within 5 business days of [place] receiving them.  *please note we are currently using [facility] which has a longer processing time than [place] typically so labs may not come back as quickly as in the past *please also note that you will see labs on mychart as soon as they post. I will later go in and write notes on them- will say "notes from Dr. Yong Channel"  Recommended follow up: Return in about 6 months (around 04/12/2020) for follow up- or sooner if needed.

## 2019-10-14 NOTE — Addendum Note (Signed)
Addended by: Thomes Cake on: 10/14/2019 02:05 PM   Modules accepted: Orders

## 2019-10-15 LAB — CBC WITH DIFFERENTIAL/PLATELET
Absolute Monocytes: 479 cells/uL (ref 200–950)
Basophils Absolute: 40 cells/uL (ref 0–200)
Basophils Relative: 0.7 %
Eosinophils Absolute: 68 cells/uL (ref 15–500)
Eosinophils Relative: 1.2 %
HCT: 47 % (ref 38.5–50.0)
Hemoglobin: 16 g/dL (ref 13.2–17.1)
Lymphs Abs: 1334 cells/uL (ref 850–3900)
MCH: 32.3 pg (ref 27.0–33.0)
MCHC: 34 g/dL (ref 32.0–36.0)
MCV: 94.8 fL (ref 80.0–100.0)
MPV: 9.6 fL (ref 7.5–12.5)
Monocytes Relative: 8.4 %
Neutro Abs: 3779 cells/uL (ref 1500–7800)
Neutrophils Relative %: 66.3 %
Platelets: 163 10*3/uL (ref 140–400)
RBC: 4.96 10*6/uL (ref 4.20–5.80)
RDW: 11.9 % (ref 11.0–15.0)
Total Lymphocyte: 23.4 %
WBC: 5.7 10*3/uL (ref 3.8–10.8)

## 2019-10-15 LAB — LIPID PANEL (REFL)
Cholesterol: 159 mg/dL (ref <200)
HDL: 48 mg/dL (ref >=40)
LDL Cholesterol (Calc): 88 mg/dL (calc)
Non-HDL Cholesterol (Calc): 111 mg/dL (calc) (ref <130)
Total CHOL/HDL Ratio: 3.3 (calc) (ref <5.0)
Triglycerides: 132 mg/dL (ref <150)

## 2019-10-15 LAB — COMPLETE METABOLIC PANEL WITH GFR
AG Ratio: 2 (calc) (ref 1.0–2.5)
ALT: 10 U/L (ref 9–46)
AST: 13 U/L (ref 10–35)
Albumin: 3.9 g/dL (ref 3.6–5.1)
Alkaline phosphatase (APISO): 48 U/L (ref 35–144)
BUN: 22 mg/dL (ref 7–25)
CO2: 29 mmol/L (ref 20–32)
Calcium: 8.8 mg/dL (ref 8.6–10.3)
Chloride: 105 mmol/L (ref 98–110)
Creat: 0.96 mg/dL (ref 0.70–1.11)
GFR, Est African American: 86 mL/min/{1.73_m2} (ref >=60)
GFR, Est Non African American: 74 mL/min/{1.73_m2} (ref >=60)
Globulin: 2 g/dL (calc) (ref 1.9–3.7)
Glucose, Bld: 89 mg/dL (ref 65–99)
Potassium: 4.4 mmol/L (ref 3.5–5.3)
Sodium: 141 mmol/L (ref 135–146)
Total Bilirubin: 0.7 mg/dL (ref 0.2–1.2)
Total Protein: 5.9 g/dL — ABNORMAL LOW (ref 6.1–8.1)

## 2019-11-05 ENCOUNTER — Other Ambulatory Visit: Payer: Self-pay | Admitting: Family Medicine

## 2020-02-03 ENCOUNTER — Other Ambulatory Visit: Payer: Self-pay | Admitting: Family Medicine

## 2020-02-11 ENCOUNTER — Telehealth: Payer: Self-pay | Admitting: Family Medicine

## 2020-02-11 NOTE — Telephone Encounter (Signed)
NO ANSWER/NO VM. Pt due to schedule Medicare Annual Wellness Visit (AWV) either virtually OR in office.   No hx; please schedule at anytime with [facility]-Nurse Health Advisor at [facility].  This should be a 45 minute visit.

## 2020-03-11 ENCOUNTER — Ambulatory Visit: Payer: 59 | Admitting: Physician Assistant

## 2020-03-26 ENCOUNTER — Encounter: Payer: Self-pay | Admitting: Nurse Practitioner

## 2020-03-26 ENCOUNTER — Ambulatory Visit: Payer: 59 | Admitting: Nurse Practitioner

## 2020-03-26 VITALS — BP 120/80 | HR 64 | Ht 70.0 in | Wt 173.4 lb

## 2020-03-26 DIAGNOSIS — K625 Hemorrhage of anus and rectum: Secondary | ICD-10-CM

## 2020-03-26 MED ORDER — HYDROCORTISONE (PERIANAL) 2.5 % EX CREA: 1.0000 "application " | TOPICAL_CREAM | Freq: Every day | CUTANEOUS | 0 refills | Status: AC

## 2020-03-26 NOTE — Progress Notes (Addendum)
ASSESSMENT AND PLAN    # 85 yo male with recurrent painless rectal bleeding with bowel movements for two months. Most certainly this is recurrent hemorrhoidal bleeding. Bowels are moving well on daily Miralax ( no longer having long narrow stools) --Will treat with Anusol cream PR Q HS x 10 days --Patient will call after treatment if bleeding persists. Should this be the case he may be a candidate to internal hemorrhoid banding to stop the bleeding.   Addendum: I discussed with Dr. Carlean Purl and he is okay arranging for internal hemorrhoid banding if topical treatment does not work  # Hx of adenomatous colon polyps. Last colonoscopy 2016. No longer on polyp surveillance program due to age   [place]     Primary Gastroenterologist : Silvano Rusk, MD  Chief Complaint : recurrent rectal bleeding  Travis Pruitt is a 85 y.o. male with PMH / [place] significant for severe diverticulosis, adenomatous colon polyps, HTN, PAF   Travis Pruitt has been followed here for constipation, hemorrhoidal bleeding and colon polyps. He was last seen in Oct 2019 for evaluation of narrow stools, hemorrhoids and minor rectal bleeding.  Bowels were moving well on Miralax at the time. He had mildly inflamed internal hemorrhoids on anoscopy.   Interval History:  Patient has returned with increased painless rectal bleeding with bowel movements over last two months. He sees the blood in the toilet and attached to the stool. Wants to discuss treatment.Marland KitchenHe is still taking Miralax and fiber and having " perfect" BMs. No longer has the long narrow stools. Other than the rectal bleeding he feels great.    Past Medical History:  Diagnosis Date  . Anxiety   . Colon polyp   . COLONIC POLYPS, ADENOMATOUS, HX OF 11/11/2003   Annotation: COLONOSCOPY 10/05 ([facility]) COLONOSCOPY 04/11/07 Qualifier: Diagnosis of  By: Carlean Purl MD, Dimas Millin Diverticulosis   . Gout   . Hx of adenomatous colonic  polyps 01/12/2015  . Hyperlipidemia   . Hypertension   . PAF (paroxysmal atrial fibrillation) ([place])    a. diagnosed in 12/2015 b. started on Eliquis for anticoagulation    Current Medications, Allergies, Past Surgical History, Family History and Social History were reviewed in [employer] record.   Current Outpatient Medications  Medication Sig Dispense Refill  . allopurinol (ZYLOPRIM) 100 MG tablet TAKE ONE TABLET BY MOUTH DAILY 90 tablet 3  . amLODipine (NORVASC) 2.5 MG tablet TAKE ONE TABLET BY MOUTH DAILY 90 tablet 2  . atenolol (TENORMIN) 50 MG tablet TAKE ONE TABLET BY MOUTH TWICE A DAY 180 tablet 1  . ibuprofen (ADVIL,MOTRIN) 200 MG tablet Take 200 mg by mouth every 8 (eight) hours as needed for moderate pain.     . Polyethylene Glycol 3350 (MIRALAX PO) Take by mouth daily.    . Wheat Dextrin (BENEFIBER DRINK MIX PO) Take by mouth daily.     No current facility-administered medications for this visit.    Review of Systems: No chest pain. No shortness of breath. No urinary complaints.   PHYSICAL EXAM :    Wt Readings from Last 3 Encounters:  03/26/20 173 lb 6 oz (78.6 kg)  10/14/19 172 lb 6.4 oz (78.2 kg)  03/04/19 173 lb (78.5 kg)    BP 120/80   Pulse 64   Ht 5\' 10"  (1.778 m)   Wt 173 lb 6 oz (78.6 kg)   BMI 24.88 kg/m  Constitutional:  Pleasant well  developed male in no acute distress. Psychiatric: Normal mood and affect. Behavior is normal. EENT: Pupils normal.  Conjunctivae are normal. No scleral icterus. Neck supple.  Cardiovascular: Normal rate, regular rhythm. No edema Pulmonary/chest: Effort normal and breath sounds normal. No wheezing, rales or rhonchi. Abdominal: Soft, nondistended, nontender. Bowel sounds active throughout. There are no masses palpable. No hepatomegaly. Rectal: Perianal hemorrhoid tags. No obvious fissure. No pain with DRE. No stool or blood in vault.   Neurological: Alert and oriented to person place and  time. Skin: Skin is warm and dry. No rashes noted.  [name], NP  03/26/2020, 3:02 PM

## 2020-03-26 NOTE — Patient Instructions (Signed)
If you are age 83 or older, your body mass index should be between 23-30. Your Body mass index is 24.88 kg/m. If this is out of the aforementioned range listed, please consider follow up with your Primary Care Provider.  If you are age 49 or younger, your body mass index should be between 19-25. Your Body mass index is 24.88 kg/m. If this is out of the aformentioned range listed, please consider follow up with your Primary Care Provider.   START hydrocortisone cream apply rectally nightly for 10 nights.  Call the office after you have completed the 10 night course if bleeding continue.  Nevin Bloodgood will be discussing with Dr. Carlean Purl if you need to have Hemorrhoid banding.  Follow up as needed for now.  Thank you for entrusting me with your care and choosing [facility].  [name], NP-C

## 2020-03-27 ENCOUNTER — Telehealth: Payer: Self-pay | Admitting: Nurse Practitioner

## 2020-03-27 NOTE — Telephone Encounter (Signed)
Pt is requesting a call back from a nurse to discuss how he needs to take the ANUSOL cream

## 2020-03-27 NOTE — Telephone Encounter (Signed)
Discussed application with patient since medication did not come with an applicator.

## 2020-08-01 ENCOUNTER — Other Ambulatory Visit: Payer: Self-pay | Admitting: Family Medicine

## 2020-08-10 ENCOUNTER — Encounter: Payer: 59 | Admitting: Family Medicine

## 2020-08-11 ENCOUNTER — Other Ambulatory Visit: Payer: Self-pay

## 2020-08-11 ENCOUNTER — Encounter: Payer: Self-pay | Admitting: Family Medicine

## 2020-08-11 ENCOUNTER — Ambulatory Visit (INDEPENDENT_AMBULATORY_CARE_PROVIDER_SITE_OTHER): Payer: 59 | Admitting: Family Medicine

## 2020-08-11 VITALS — BP 139/81 | HR 60 | Temp 97.9°F | Ht 70.0 in | Wt 171.6 lb

## 2020-08-11 DIAGNOSIS — I700 Atherosclerosis of aorta: Secondary | ICD-10-CM

## 2020-08-11 DIAGNOSIS — M1A9XX0 Chronic gout, unspecified, without tophus (tophi): Secondary | ICD-10-CM

## 2020-08-11 DIAGNOSIS — E785 Hyperlipidemia, unspecified: Secondary | ICD-10-CM

## 2020-08-11 DIAGNOSIS — I480 Paroxysmal atrial fibrillation: Secondary | ICD-10-CM

## 2020-08-11 DIAGNOSIS — I10 Essential (primary) hypertension: Secondary | ICD-10-CM

## 2020-08-11 DIAGNOSIS — Z0000 Encounter for general adult medical examination without abnormal findings: Secondary | ICD-10-CM

## 2020-08-11 LAB — COMPREHENSIVE METABOLIC PANEL
ALT: 11 U/L (ref 0–53)
AST: 12 U/L (ref 0–37)
Albumin: 3.9 g/dL (ref 3.5–5.2)
Alkaline Phosphatase: 49 U/L (ref 39–117)
BUN: 19 mg/dL (ref 6–23)
CO2: 28 mEq/L (ref 19–32)
Calcium: 8.9 mg/dL (ref 8.4–10.5)
Chloride: 104 mEq/L (ref 96–112)
Creatinine, Ser: 0.97 mg/dL (ref 0.40–1.50)
GFR: 72.66 mL/min (ref >60.00)
Glucose, Bld: 93 mg/dL (ref 70–99)
Potassium: 4.2 mEq/L (ref 3.5–5.1)
Sodium: 138 mEq/L (ref 135–145)
Total Bilirubin: 0.6 mg/dL (ref 0.2–1.2)
Total Protein: 6.1 g/dL (ref 6.0–8.3)

## 2020-08-11 LAB — CBC WITH DIFFERENTIAL/PLATELET
Basophils Absolute: 0 10*3/uL (ref 0.0–0.1)
Basophils Relative: 0.4 % (ref 0.0–3.0)
Eosinophils Absolute: 0.1 10*3/uL (ref 0.0–0.7)
Eosinophils Relative: 1.5 % (ref 0.0–5.0)
HCT: 46.9 % (ref 39.0–52.0)
Hemoglobin: 16.1 g/dL (ref 13.0–17.0)
Lymphocytes Relative: 26.3 % (ref 12.0–46.0)
Lymphs Abs: 1.5 10*3/uL (ref 0.7–4.0)
MCHC: 34.4 g/dL (ref 30.0–36.0)
MCV: 93.8 fl (ref 78.0–100.0)
Monocytes Absolute: 0.5 10*3/uL (ref 0.1–1.0)
Monocytes Relative: 8.5 % (ref 3.0–12.0)
Neutro Abs: 3.5 10*3/uL (ref 1.4–7.7)
Neutrophils Relative %: 63.3 % (ref 43.0–77.0)
Platelets: 158 10*3/uL (ref 150.0–400.0)
RBC: 5 Mil/uL (ref 4.22–5.81)
RDW: 13.5 % (ref 11.5–15.5)
WBC: 5.5 10*3/uL (ref 4.0–10.5)

## 2020-08-11 LAB — LIPID PANEL
Cholesterol: 163 mg/dL (ref 0–200)
HDL: 51.2 mg/dL (ref >39.00)
LDL Cholesterol: 95 mg/dL (ref 0–99)
NonHDL: 112.24
Total CHOL/HDL Ratio: 3
Triglycerides: 86 mg/dL (ref 0.0–149.0)
VLDL: 17.2 mg/dL (ref 0.0–40.0)

## 2020-08-11 LAB — URIC ACID: Uric Acid, Serum: 5.3 mg/dL (ref 4.0–7.8)

## 2020-08-11 MED ORDER — ATENOLOL 50 MG PO TABS: 50.0000 mg | ORAL_TABLET | Freq: Two times a day (BID) | ORAL | 3 refills | Status: DC

## 2020-08-11 MED ORDER — AMLODIPINE BESYLATE 2.5 MG PO TABS: 2.5000 mg | ORAL_TABLET | Freq: Every day | ORAL | 3 refills | Status: DC

## 2020-08-11 MED ORDER — ALLOPURINOL 100 MG PO TABS: 100.0000 mg | ORAL_TABLET | Freq: Every day | ORAL | 3 refills | Status: DC

## 2020-08-11 NOTE — Progress Notes (Signed)
Phone: [phone]   Subjective:  Patient presents today for their annual physical. Chief complaint-noted.   See problem oriented charting- ROS- full  review of systems was completed and negative per full ROS sheet  The following were reviewed and entered/updated in epic: Past Medical History:  Diagnosis Date   Anxiety    Colon polyp    COLONIC POLYPS, ADENOMATOUS, HX OF 11/11/2003   Annotation: COLONOSCOPY 10/05 ([facility]) COLONOSCOPY 04/11/07 Qualifier: Diagnosis of  By: Carlean Purl MD, Tonna Boehringer E    Diverticulosis    Gout    Hx of adenomatous colonic polyps 01/12/2015   Hyperlipidemia    Hypertension    PAF (paroxysmal atrial fibrillation) ([place])    a. diagnosed in 12/2015 b. started on Eliquis for anticoagulation   Patient Active Problem List   Diagnosis Date Noted   Syncope 01/06/2016    Priority: High   PAF (paroxysmal atrial fibrillation) ([place]) 12/10/2015    Priority: High   History of paroxysmal supraventricular tachycardia 12/03/2015    Priority: High   Multiple rib fractures 11/30/2015    Priority: Medium   Hyperlipidemia 10/25/2006    Priority: Medium   Gout 10/25/2006    Priority: Medium   Essential hypertension 10/25/2006    Priority: Medium   History of skin cancer 06/13/2017    Priority: Low   Hx of adenomatous colonic polyps 01/12/2015    Priority: Low   Generalized anxiety disorder 11/27/2007    Priority: Low   Aortic atherosclerosis ([place]) 12/01/2016   Sternoclavicular (joint) (ligament) sprain, right, sequela 09/26/2016   Scapular dyskinesis 09/26/2016   Rotator cuff syndrome of right shoulder 09/26/2016   Past Surgical History:  Procedure Laterality Date   COLONOSCOPY     INGUINAL HERNIA REPAIR      Family History  Problem Relation Age of Onset   Cancer Mother        lung   Colon cancer Neg Hx     Medications- reviewed and updated Current Outpatient Medications  Medication Sig Dispense Refill   ibuprofen (ADVIL,MOTRIN) 200 MG tablet  Take 200 mg by mouth every 8 (eight) hours as needed for moderate pain.      Polyethylene Glycol 3350 (MIRALAX PO) Take by mouth daily.     Wheat Dextrin (BENEFIBER DRINK MIX PO) Take by mouth daily.     allopurinol (ZYLOPRIM) 100 MG tablet Take 1 tablet (100 mg total) by mouth daily. 90 tablet 3   amLODipine (NORVASC) 2.5 MG tablet Take 1 tablet (2.5 mg total) by mouth daily. 90 tablet 3   atenolol (TENORMIN) 50 MG tablet Take 1 tablet (50 mg total) by mouth 2 (two) times daily. 180 tablet 3   No current facility-administered medications for this visit.    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 Oropharynx normal Neck: no thyromegaly CV: RRR no murmurs rubs or gallops Lungs: CTAB no crackles, wheeze, rhonchi Abdomen: soft/nontender/nondistended/normal bowel sounds. No rebound or guarding.  Ext: no edema Skin: warm,  dry Neuro: grossly normal, moves all extremities, PERRLA   Assessment and Plan  85 y.o. male presenting for annual physical.  Health Maintenance counseling: 1. Anticipatory guidance: Patient counseled regarding regular dental exams -q6 months - dentist retired, eye exams -yearly advised but declines for now ,  avoiding smoking and second hand smoke , limiting alcohol to 2 beverages per day -2 oz scotch at night.   2. Risk factor reduction:  Advised patient of need for regular exercise and diet rich and fruits and vegetables to reduce risk of heart attack and stroke. Exercise- active in his yard an acre of land. Purchased rowing  machine and planned to start using last year- he has used this some.  Diet- reasonably healthy-  including fruits/veggies- tries to eat at home for most part.  Wt Readings from Last 3 Encounters:  08/11/20 171 lb 9.6 oz (77.8 kg)  03/26/20 173 lb 6 oz (78.6 kg)  10/14/19 172 lb 6.4 oz (78.2 kg)  3. Immunizations/screenings/ancillary studies- Shingrix - declines again - advised at pharmacy and COVID-19 3 booster - has had this- considering 4th. Otherwise up-to-date. Immunization History  Administered Date(s) Administered   Fluad Quad(high Dose 65+) 09/24/2018, 10/14/2019   Influenza Split 01/05/2011   Influenza Whole 11/27/2007, 11/04/2008, 11/06/2009   Influenza, High Dose Seasonal PF 11/07/2012, 12/01/2016, 10/03/2017   Influenza,inj,Quad PF,6+ Mos 11/05/2013, 11/03/2014, 12/01/2015   PFIZER(Purple Top)SARS-COV-2 Vaccination 03/14/2019, 04/06/2019, 12/16/2019   Pneumococcal Conjugate-13 06/18/2014   Pneumococcal Polysaccharide-23 06/12/2007   Td 06/12/2007   Tdap 02/26/2019   Zoster, Live 06/12/2007  4. Prostate cancer screening-  last done on 10/29/14 - denies any change in urinary symptoms past age based screening recommendations. Stable nocturia twice a night Lab Results  Component Value Date   PSA 0.29 10/29/2014   PSA 0.26 08/07/2013   PSA 0.22 06/05/2012   5. Colon cancer screening - 01/06/2015 with Dr. Carlean Purl with no further colonoscopy due to age 86. Skin cancer screening- follows with Dr. Ronnald Ramp every 6 months due to dad with melanoma- several cryo areas yesterday. advised regular sunscreen use. Denies worrisome, changing, or new skin lesions.  7. former smoker- quit in 1970s. No AAA on CT abd/pelvis 11/2015 8. STD screening - opts out previously- declines again today. Recommended condoms outside of marriage  Status of chronic or acute concerns   #social update- some upcoming trips out [place]. He looking forward to this.   #hypertension S: medication: atenolol 50 mg,  amlodipine 2.5 mg BP Readings from Last 3 Encounters:  08/11/20 139/81  03/26/20 120/80  10/14/19 122/74  A/P: reasonable control- continue current meds  #hyperlipidemia #aortic atherosclerosis- goal LDL under 70 S: Medication: rosuvastatin 10 mg once a week was the plan  A/P: patient opts out of treatment- wants to work on diet alone- still prefer LDL under 70  #Gout S: 0 flares in over a year on allopurinol 100 mg Lab Results  Component Value Date   LABURIC 5.7 02/26/2019  A/P:hopefully stable- update uric acid today. Continue current meds for now   # Atrial fibrillation- no recurrence since fall from ladder 2017. Still on atenolol for rate control if he goes back into a fib. 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  Lab/Order associations:not fasting   ICD-10-CM   1. Preventative health care  Z00.00     2. Hyperlipidemia, unspecified hyperlipidemia type  E78.5 CBC with Differential/Platelet    Comprehensive metabolic panel    Lipid panel    3. Essential hypertension  I10     4. Aortic atherosclerosis (HCC) Chronic I70.0     5. PAF (paroxysmal atrial fibrillation) (HCC) Chronic I48.0     6. Chronic gout without tophus, unspecified cause, unspecified site  [id number] Uric acid     Meds ordered this encounter  Medications   allopurinol (ZYLOPRIM) 100 MG tablet    Sig: Take 1 tablet (100 mg total) by mouth daily.    Dispense:  90 tablet    Refill:  3   amLODipine (NORVASC) 2.5 MG tablet    Sig: Take 1 tablet (2.5 mg total) by mouth daily.    Dispense:   90 tablet    Refill:  3   atenolol (TENORMIN) 50 MG tablet    Sig: Take 1 tablet (50 mg total) by mouth 2 (two) times daily.    Dispense:  180 tablet    Refill:  3    I,Harris Phan,acting as a scribe for Garret Reddish, MD.,have documented all relevant documentation on the behalf of Garret Reddish, MD,as directed by  Garret Reddish, MD while in the presence of Garret Reddish, MD.  I, Garret Reddish, MD, have reviewed all documentation for this visit. The documentation on 08/11/20 for the exam, diagnosis, procedures, and orders are all accurate and complete.   Return precautions advised.  Garret Reddish, MD

## 2020-08-11 NOTE — Patient Instructions (Addendum)
Health Maintenance Due  Topic Date Due   Zoster Vaccines- Shingrix (1 of 2) Please check with your pharmacy to see if they have the shingrix vaccine. If they do- please get this immunization and update [place] by phone call or mychart with dates you receive the vaccine  Never done   COVID-19 Vaccine (4 - Booster for [employer] series) will get this scheduled in the fall.  04/14/2020   Please stop by lab before you go If you have mychart- we will send your results within 3 business days of [place] receiving them.  If you do not have mychart- we will call you about results within 5 business days of [place] receiving them.  *please also note that you will see labs on mychart as soon as they post. I will later go in and write notes on them- will say "notes from Dr. Yong Channel"   Recommended follow up: Return in about 1 year (around 08/11/2021) for a physical.

## 2020-11-25 ENCOUNTER — Encounter: Payer: Self-pay | Admitting: Nurse Practitioner

## 2020-11-25 ENCOUNTER — Ambulatory Visit: Payer: 59 | Admitting: Nurse Practitioner

## 2020-11-25 VITALS — BP 110/70 | HR 50 | Ht 70.0 in | Wt 168.0 lb

## 2020-11-25 DIAGNOSIS — K644 Residual hemorrhoidal skin tags: Secondary | ICD-10-CM

## 2020-11-25 NOTE — Patient Instructions (Signed)
If you are age 83 or older, your body mass index should be between 23-30. Your Body mass index is 24.11 kg/m. If this is out of the aforementioned range listed, please consider follow up with your Primary Care Provider.  The [facility] providers would like to encourage you to use [facility] to communicate with providers for non-urgent requests or questions.  Due to long hold times on the telephone, sending your provider a message by [facility] may be faster and more efficient way to get a response. Please allow 48 business hours for a response.  Please remember that this is for non-urgent requests/questions.  It was great seeing you today! Thank you for entrusting me with your care and choosing [facility].  [name], NP

## 2020-11-25 NOTE — Progress Notes (Signed)
ASSESSMENT AND PLAN    # 85 yo male here with concerns for a perianal bump noticed while bathing. On exam he has fleshy hemorrhoidal tissue, a small external hemorrhoid and several perianal comedones.  --I think the small hemorrhoid will continue to shrink. Would not bother with steroid cream at this point --Thankfully bowels continue to move well on Miralax --Follow up as needed.   #History of adenomatous colon polyps in 2016.  No recall colonoscopy planned given his age  HISTORY OF PRESENT ILLNESS    Chief Complaint : perianal bump  Travis Pruitt is a 85 y.o. male known to Dr. Carlean Purl  with a past medical history significant for adenomatous colon polyps, severe diverticulosis especially in the left colon , hemorrhoids , Afib not anticoagulated, HLD, gout See PMH below for any additional medical problems.    Patient last seen in Feb 2022 for minor rectal bleeding with bowel movement. His bowels were moving well on Miralax at the time. Treated with Anusol for hemorrhoidal bleeding. Plan was for hemorrhoid banding if he didn't improve.    INTERVAL HISTORY: [name] comes in with concerns about a "bump" outside of anus which is has noticed when bathing. He has no rectal bleeding or anorectal pain. His bowels continue to move well on Petros / PERTINENT STUDIES:    Dec 2016 polyp surveillance colonoscopy .  --Three sessile polyps ranging from 2 to 52mm in size were found at the cecum, in the ascending colon, and transverse colon; polypectomies were performed with cold forceps and with a cold snare --There was severe diverticulosis noted in the sigmoid colon --here was mild diverticulosis noted in the ascending colon --Internal hemorrhoids --The examination was otherwise normal - good prep - pediatric scope used  ** Recall colonoscopy not recommended  Surgical [P], ascending, cecum, transverse, polyp (3) - TUBULAR ADENOMAS (X2) AND  HYPERPLASTIC POLYP. - NO HIGH GRADE DYSPLASIA OR INVASIVE MALIGNANCY IDENTIFIED     Past Medical History:  Diagnosis Date   Anxiety    Colon polyp    COLONIC POLYPS, ADENOMATOUS, HX OF 11/11/2003   Annotation: COLONOSCOPY 10/05 ([facility]) COLONOSCOPY 04/11/07 Qualifier: Diagnosis of  By: Carlean Purl MD, Tonna Boehringer E    Diverticulosis    Gout    Hx of adenomatous colonic polyps 01/12/2015   Hyperlipidemia    Hypertension    PAF (paroxysmal atrial fibrillation) ([place])    a. diagnosed in 12/2015 b. started on Eliquis for anticoagulation    Current Medications, Allergies, Past Surgical History, Family History and Social History were reviewed in [facility] record.   Current Outpatient Medications  Medication Sig Dispense Refill   allopurinol (ZYLOPRIM) 100 MG tablet Take 1 tablet (100 mg total) by mouth daily. 90 tablet 3   amLODipine (NORVASC) 2.5 MG tablet Take 1 tablet (2.5 mg total) by mouth daily. 90 tablet 3   atenolol (TENORMIN) 50 MG tablet Take 1 tablet (50 mg total) by mouth 2 (two) times daily. 180 tablet 3   ibuprofen (ADVIL,MOTRIN) 200 MG tablet Take 200 mg by mouth every 8 (eight) hours as needed for moderate pain.      Polyethylene Glycol 3350 (MIRALAX PO) Take by mouth daily.     Wheat Dextrin (BENEFIBER DRINK MIX PO) Take by mouth daily.     No current facility-administered medications for this visit.    Review of Systems: No chest pain. No shortness of breath. No urinary complaints.  PHYSICAL EXAM :    Wt Readings from Last 3 Encounters:  08/11/20 171 lb 9.6 oz (77.8 kg)  03/26/20 173 lb 6 oz (78.6 kg)  10/14/19 172 lb 6.4 oz (78.2 kg)    BP 110/70   Pulse (!) 50   Ht 5\' 10"  (1.778 m)   Wt 168 lb (76.2 kg)   SpO2 99%   BMI 24.11 kg/m  Constitutional:  Generally well appearing male in no acute distress. Psychiatric: Pleasant. Normal mood and affect. Behavior is normal. Cardiovascular: Normal rate, regular rhythm. No  edema Pulmonary/chest: Effort normal and breath sounds normal. No wheezing, rales or rhonchi. Abdominal: Soft, nondistended, nontender. Bowel sounds active throughout. There are no masses palpable. No hepatomegaly. Rectal: fleshy, perianal hemorrhoid tags. Very small external hemorrhoid, several perianal comedones Neurological: Alert and oriented to person place and time.   Travis Savoy, NP  11/25/2020, 11:25 AM  Cc:  Travis Olp, MD

## 2020-11-26 ENCOUNTER — Ambulatory Visit: Payer: 59 | Admitting: Nurse Practitioner

## 2021-02-19 NOTE — Progress Notes (Incomplete)
Phone [phone] In person visit   Subjective:   Travis Pruitt is a 86 y.o. year old very pleasant male patient who presents for/with See problem oriented charting No chief complaint on file.   This visit occurred during the SARS-CoV-2 public health emergency.  Safety protocols were in place, including screening questions prior to the visit, additional usage of staff PPE, and extensive cleaning of exam room while observing appropriate contact time as indicated for disinfecting solutions.   Past Medical History-  Patient Active Problem List   Diagnosis Date Noted   History of skin cancer 06/13/2017   Aortic atherosclerosis ([place]) 12/01/2016   Sternoclavicular (joint) (ligament) sprain, right, sequela 09/26/2016   Scapular dyskinesis 09/26/2016   Rotator cuff syndrome of right shoulder 09/26/2016   Syncope 01/06/2016   PAF (paroxysmal atrial fibrillation) ([place]) 12/10/2015   History of paroxysmal supraventricular tachycardia 12/03/2015   Multiple rib fractures 11/30/2015   Hx of adenomatous colonic polyps 01/12/2015   Generalized anxiety disorder 11/27/2007   Hyperlipidemia 10/25/2006   Gout 10/25/2006   Essential hypertension 10/25/2006    Medications- reviewed and updated Current Outpatient Medications  Medication Sig Dispense Refill   allopurinol (ZYLOPRIM) 100 MG tablet Take 1 tablet (100 mg total) by mouth daily. 90 tablet 3   amLODipine (NORVASC) 2.5 MG tablet Take 1 tablet (2.5 mg total) by mouth daily. 90 tablet 3   atenolol (TENORMIN) 50 MG tablet Take 1 tablet (50 mg total) by mouth 2 (two) times daily. 180 tablet 3   ibuprofen (ADVIL,MOTRIN) 200 MG tablet Take 200 mg by mouth every 8 (eight) hours as needed for moderate pain.      Polyethylene Glycol 3350 (MIRALAX PO) Take by mouth daily.     Wheat Dextrin (BENEFIBER DRINK MIX PO) Take by mouth daily.     No current facility-administered medications for this visit.     Objective:  There were no vitals  taken for this visit. Gen: NAD, resting comfortably CV: RRR no murmurs rubs or gallops Lungs: CTAB no crackles, wheeze, rhonchi Abdomen: soft/nontender/nondistended/normal bowel sounds. No rebound or guarding.  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 ***  # discussed on a prior visit about possible hemangioma liver done on ct angio chest 12/16/15 and they discussed MR abdomen being done- he has declined ***  #hypertension S: medication: atenolol 50 mg BID, amlodipine 2.5 mg daily Home readings #s: *** BP Readings from Last 3 Encounters:  11/25/20 110/70  08/11/20 139/81  03/26/20 120/80  A/P: ***   #hyperlipidemia #aortic atherosclerosis- goal LDL under 70 S: Medication: discontinued rosuvastatin 10 mg once a week Lab Results  Component Value Date   CHOL 163 08/11/2020   HDL 51.20 08/11/2020   LDLCALC 95 08/11/2020   TRIG 86.0 08/11/2020   CHOLHDL 3 08/11/2020   A/P: ***  #Gout S: *** flares in *** on allopurinol 100 mg Lab Results  Component Value Date   LABURIC 5.3 08/11/2020   A/P:***   # Atrial fibrillation- no recurrence since fall from ladder 2017. Still on atenolol for rate control if he goes back into  a fib. No aspirin/coumadin/etc A/P: ***   Health Maintenance Due  Topic Date Due   Zoster Vaccines- Shingrix (1 of 2) Never done   COVID-19 Vaccine (4 - Booster for Pfizer series) 02/10/2020   INFLUENZA VACCINE  08/31/2020   Recommended follow up: No follow-ups on file. Future Appointments  Date Time Provider [place]  02/26/2021  1:20 PM Marin Olp,  MD [facility]  08/11/2021 11:00 AM Marin Olp, MD [facility]    Lab/Order associations: No diagnosis found.  No orders of the defined types were placed in this encounter.  I,Jada Bradford,acting as a scribe for Garret Reddish, MD.,have documented all relevant documentation on the behalf of Garret Reddish, MD,as directed by  Garret Reddish, MD while in the presence of Garret Reddish, MD.  ***  Return precautions advised.  Burnett Corrente

## 2021-02-26 ENCOUNTER — Ambulatory Visit: Payer: 59 | Admitting: Family Medicine

## 2021-02-26 DIAGNOSIS — M1A9XX0 Chronic gout, unspecified, without tophus (tophi): Secondary | ICD-10-CM

## 2021-02-26 DIAGNOSIS — I480 Paroxysmal atrial fibrillation: Secondary | ICD-10-CM

## 2021-02-26 DIAGNOSIS — I10 Essential (primary) hypertension: Secondary | ICD-10-CM

## 2021-02-26 DIAGNOSIS — I700 Atherosclerosis of aorta: Secondary | ICD-10-CM

## 2021-02-26 DIAGNOSIS — E785 Hyperlipidemia, unspecified: Secondary | ICD-10-CM

## 2021-08-11 ENCOUNTER — Encounter: Payer: 59 | Admitting: Family Medicine

## 2021-09-09 ENCOUNTER — Other Ambulatory Visit: Payer: Self-pay | Admitting: Family Medicine

## 2021-10-25 ENCOUNTER — Encounter: Payer: Self-pay | Admitting: *Deleted

## 2022-01-10 ENCOUNTER — Ambulatory Visit (INDEPENDENT_AMBULATORY_CARE_PROVIDER_SITE_OTHER): Payer: 59 | Admitting: Family Medicine

## 2022-01-10 ENCOUNTER — Encounter: Payer: Self-pay | Admitting: Family Medicine

## 2022-01-10 VITALS — BP 122/60 | HR 55 | Temp 97.6°F | Ht 70.0 in | Wt 166.2 lb

## 2022-01-10 DIAGNOSIS — M1A9XX0 Chronic gout, unspecified, without tophus (tophi): Secondary | ICD-10-CM | POA: Diagnosis not present

## 2022-01-10 DIAGNOSIS — E785 Hyperlipidemia, unspecified: Secondary | ICD-10-CM

## 2022-01-10 DIAGNOSIS — I700 Atherosclerosis of aorta: Secondary | ICD-10-CM

## 2022-01-10 DIAGNOSIS — I10 Essential (primary) hypertension: Secondary | ICD-10-CM | POA: Diagnosis not present

## 2022-01-10 DIAGNOSIS — I480 Paroxysmal atrial fibrillation: Secondary | ICD-10-CM

## 2022-01-10 DIAGNOSIS — Z0000 Encounter for general adult medical examination without abnormal findings: Secondary | ICD-10-CM

## 2022-01-10 LAB — LIPID PANEL
Cholesterol: 153 mg/dL (ref 0–200)
HDL: 49.2 mg/dL (ref >39.00)
LDL Cholesterol: 75 mg/dL (ref 0–99)
NonHDL: 103.57
Total CHOL/HDL Ratio: 3
Triglycerides: 143 mg/dL (ref 0.0–149.0)
VLDL: 28.6 mg/dL (ref 0.0–40.0)

## 2022-01-10 LAB — COMPREHENSIVE METABOLIC PANEL
ALT: 10 U/L (ref 0–53)
AST: 12 U/L (ref 0–37)
Albumin: 3.9 g/dL (ref 3.5–5.2)
Alkaline Phosphatase: 45 U/L (ref 39–117)
BUN: 25 mg/dL — ABNORMAL HIGH (ref 6–23)
CO2: 30 mEq/L (ref 19–32)
Calcium: 9.1 mg/dL (ref 8.4–10.5)
Chloride: 104 mEq/L (ref 96–112)
Creatinine, Ser: 0.88 mg/dL (ref 0.40–1.50)
GFR: 79.25 mL/min (ref >60.00)
Glucose, Bld: 95 mg/dL (ref 70–99)
Potassium: 4.8 mEq/L (ref 3.5–5.1)
Sodium: 140 mEq/L (ref 135–145)
Total Bilirubin: 0.6 mg/dL (ref 0.2–1.2)
Total Protein: 6.1 g/dL (ref 6.0–8.3)

## 2022-01-10 LAB — CBC WITH DIFFERENTIAL/PLATELET
Basophils Absolute: 0 10*3/uL (ref 0.0–0.1)
Basophils Relative: 0.4 % (ref 0.0–3.0)
Eosinophils Absolute: 0.1 10*3/uL (ref 0.0–0.7)
Eosinophils Relative: 1.5 % (ref 0.0–5.0)
HCT: 47.5 % (ref 39.0–52.0)
Hemoglobin: 16.2 g/dL (ref 13.0–17.0)
Lymphocytes Relative: 25.2 % (ref 12.0–46.0)
Lymphs Abs: 1.2 10*3/uL (ref 0.7–4.0)
MCHC: 34.1 g/dL (ref 30.0–36.0)
MCV: 94.8 fl (ref 78.0–100.0)
Monocytes Absolute: 0.5 10*3/uL (ref 0.1–1.0)
Monocytes Relative: 10.1 % (ref 3.0–12.0)
Neutro Abs: 3.1 10*3/uL (ref 1.4–7.7)
Neutrophils Relative %: 62.8 % (ref 43.0–77.0)
Platelets: 176 10*3/uL (ref 150.0–400.0)
RBC: 5.01 Mil/uL (ref 4.22–5.81)
RDW: 13 % (ref 11.5–15.5)
WBC: 4.9 10*3/uL (ref 4.0–10.5)

## 2022-01-10 LAB — URIC ACID: Uric Acid, Serum: 5.1 mg/dL (ref 4.0–7.8)

## 2022-01-10 NOTE — Patient Instructions (Addendum)
Consider RSV shot at pharmacy  Please stop by lab before you go If you have mychart- we will send your results within 3 business days of [place] receiving them.  If you do not have mychart- we will call you about results within 5 business days of [place] receiving them.  *please also note that you will see labs on mychart as soon as they post. I will later go in and write notes on them- will say "notes from Dr. Yong Channel"   Recommended follow up: Return in about 1 year (around 01/11/2023) for physical or sooner if needed.Schedule b4 you leave.

## 2022-01-10 NOTE — Progress Notes (Signed)
Phone: [phone]   Subjective:  Patient presents today for their annual physical. Chief complaint-noted.   See problem oriented charting- ROS- full  review of systems was completed and negative  except for: some weight loss after dental issues in past but doing better  The following were reviewed and entered/updated in epic: Past Medical History:  Diagnosis Date   Anxiety    Colon polyp    COLONIC POLYPS, ADENOMATOUS, HX OF 11/11/2003   Annotation: COLONOSCOPY 10/05 ([facility]) COLONOSCOPY 04/11/07 Qualifier: Diagnosis of  By: Carlean Purl MD, Tonna Boehringer E    Diverticulosis    Gout    Hx of adenomatous colonic polyps 01/12/2015   Hyperlipidemia    Hypertension    PAF (paroxysmal atrial fibrillation) ([place])    a. diagnosed in 12/2015 b. started on Eliquis for anticoagulation   Patient Active Problem List   Diagnosis Date Noted   Syncope 01/06/2016    Priority: High   PAF (paroxysmal atrial fibrillation) ([place]) 12/10/2015    Priority: High   History of paroxysmal supraventricular tachycardia 12/03/2015    Priority: High   Multiple rib fractures 11/30/2015    Priority: Medium    Hyperlipidemia 10/25/2006    Priority: Medium    Gout 10/25/2006    Priority: Medium    Essential hypertension 10/25/2006    Priority: Medium    History of skin cancer 06/13/2017    Priority: Low   Hx of adenomatous colonic polyps 01/12/2015    Priority: Low   Generalized anxiety disorder 11/27/2007    Priority: Low   Aortic atherosclerosis ([place]) 12/01/2016   Sternoclavicular (joint) (ligament) sprain, right, sequela 09/26/2016   Scapular dyskinesis 09/26/2016   Rotator cuff syndrome of right shoulder 09/26/2016   Past Surgical History:  Procedure Laterality Date   COLONOSCOPY     INGUINAL HERNIA REPAIR      Family History  Problem Relation Age of Onset   Cancer Mother        lung   Colon cancer Neg Hx     Medications- reviewed and updated Current Outpatient Medications  Medication  Sig Dispense Refill   allopurinol (ZYLOPRIM) 100 MG tablet TAKE ONE TABLET BY MOUTH DAILY 90 tablet 3   amLODipine (NORVASC) 2.5 MG tablet TAKE ONE TABLET BY MOUTH DAILY 90 tablet 3   atenolol (TENORMIN) 50 MG tablet TAKE ONE TABLET BY MOUTH TWICE A DAY 180 tablet 3   ibuprofen (ADVIL,MOTRIN) 200 MG tablet Take 200 mg by mouth every 8 (eight) hours as needed for moderate pain.      Polyethylene Glycol 3350 (MIRALAX PO) Take by mouth daily.     Wheat Dextrin (BENEFIBER DRINK MIX PO) Take by mouth daily.     No current facility-administered medications for this visit.    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 Oropharynx normal Neck: no thyromegaly CV: RRR no murmurs rubs or gallops Lungs: CTAB no crackles, wheeze, rhonchi  Abdomen: soft/nontender/nondistended/normal bowel sounds. No rebound or guarding.  Ext: no edema Skin: warm, dry Neuro: grossly normal, moves all extremities, PERRLA, gets onto table easily without assist   Assessment and Plan  86 y.o. male presenting for annual physical.  Health Maintenance counseling: 1. Anticipatory guidance: Patient counseled regarding regular dental exams -q6 months- had issues with implant and lost weight he reports- switched dentist, eye exams - declines follow up- denies issues,  avoiding smoking and second hand smoke , limiting alcohol to 2 beverages per day - 2 oz scotch a day or less, no illicit drugs .   2. Risk factor reduction:  Advised  patient of need for regular exercise and diet rich and fruits and vegetables to reduce risk of heart attack and stroke.  Exercise- trying to be active in the yard at home- close to an acre- 10 yardwaste bags a week- keeps him going. .  Diet/weight management-weight down 5 lbs in last year- related to dental issues- couldn't eat his normal foods revolving around an implant.  Wt Readings from Last 3 Encounters:  01/10/22 166 lb 3.2 oz (75.4 kg)  11/25/20 168 lb (76.2 kg)  08/11/20 171 lb 9.6 oz (77.8 kg)  3. Immunizations/screenings/ancillary studies- declines shingrix, rsv- considering . Up to date on covid and flu shots.   Immunization History  Administered Date(s) Administered   Fluad Quad(high Dose 65+) 09/24/2018, 10/14/2019   Influenza Split 01/05/2011   Influenza Whole 11/27/2007, 11/04/2008, 11/06/2009   Influenza, High Dose Seasonal PF 11/07/2012, 12/01/2016, 10/03/2017, 11/14/2021   Influenza,inj,Quad PF,6+ Mos 11/05/2013, 11/03/2014, 12/01/2015   PFIZER Comirnaty(Gray Top)Covid-19 Tri-Sucrose Vaccine 11/14/2021   PFIZER(Purple Top)SARS-COV-2 Vaccination 03/14/2019, 04/06/2019, 12/16/2019   Pneumococcal Conjugate-13 06/18/2014   Pneumococcal Polysaccharide-23 06/12/2007   Td 06/12/2007   Tdap 02/26/2019   Zoster, Live 06/12/2007  4. Prostate cancer screening-  last done on 10/29/14 - denies any change in urinary symptoms past age based screening recommendations. Stable nocturia twice a night - stable 5. Colon cancer screening - 01/06/2015 with Dr. Carlean Purl with no further colonoscopy due to age  42. Skin cancer screening- follows with Dr. Wilhemina Bonito every year due to dad with  melanoma- several cryo areas typically advised regular sunscreen use. Denies worrisome, changing, or new skin lesions.  7. former smoker- quit in 1970s. No AAA on CT abd/pelvis 11/2015.   8. STD screening - opts out previously- declines again today. Recommended condoms if sexually active outside of marriage- or  monogamy   Status of chronic or acute concerns   #Code status- reports still full code even if he doesn't want to be aggressive about  Imaging/follow up of some studies as below  #hypertension S: medication: atenolol 50 mg, amlodipine 2.5 mg A/P: Controlled. Continue current medications.   #hyperlipidemia #aortic atherosclerosis- goal LDL under 70 S: Medication: rosuvastatin 10 mg once a week Lab Results  Component Value Date   CHOL 163 08/11/2020   HDL 51.20 08/11/2020   [place] 95 08/11/2020   TRIG 86.0 08/11/2020   CHOLHDL 3 08/11/2020  A/P: even with aortic atherosclerosis and LDL goal under 70- he prefers remainingon current dose unless increase in cholesterol  #Gout S: 0 flares in last year on allopurinol 100 mg A/P:update uric acid level today   # Atrial fibrillation- no recurrence since fall from ladder 2017. Still on atenolol for rate control if he goes back into a fib. 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  Lab/Order associations:NOT fasting   ICD-10-CM   1. Preventative health care  Z00.00     2. Hyperlipidemia, unspecified hyperlipidemia type  E78.5     3. Chronic gout without tophus, unspecified cause, unspecified site  M1A.[id number]     4. Essential hypertension  I10     5. PAF (paroxysmal atrial fibrillation) (HCC)  I48.0     6. Aortic atherosclerosis (HCC)  I70.0       No orders of the defined types were placed in this  encounter.   Return precautions advised.  Garret Reddish, MD

## 2022-03-24 ENCOUNTER — Telehealth: Payer: Self-pay

## 2022-03-24 NOTE — Telephone Encounter (Signed)
Called patient to schedule Medicare Annual Wellness Visit (AWV). Unable to reach patient.  Both telephone numbers in chart are invalid and not working numbers.    Please schedule an appointment at any time with [place].  Norton Blizzard, [place] (AAMA)  [facility] [phone]

## 2022-06-08 ENCOUNTER — Ambulatory Visit: Payer: 59

## 2022-06-09 ENCOUNTER — Ambulatory Visit: Payer: 59

## 2022-06-09 ENCOUNTER — Telehealth: Payer: Self-pay

## 2022-06-09 NOTE — Telephone Encounter (Signed)
Patient wants to know if he can stop Allopurinol and Amlodipine

## 2022-06-12 NOTE — Telephone Encounter (Signed)
Ideally he would remain on both of these-he has done well without gout flares on allopurinol and his blood pressure has been well-controlled on amlodipine-if his blood pressure running low?  If so would need to reconsider this

## 2022-06-13 NOTE — Telephone Encounter (Signed)
Called and tried to reach pt and kept getting a busy signal, will try later.

## 2022-06-14 ENCOUNTER — Telehealth: Payer: Self-pay | Admitting: Family Medicine

## 2022-06-14 NOTE — Telephone Encounter (Signed)
Pt would like a call back concerning his meds.

## 2022-06-14 NOTE — Telephone Encounter (Signed)
Called and spoke with pt and message given from prior phone note.

## 2022-11-01 ENCOUNTER — Other Ambulatory Visit: Payer: Self-pay | Admitting: Family Medicine

## 2023-01-12 ENCOUNTER — Ambulatory Visit (INDEPENDENT_AMBULATORY_CARE_PROVIDER_SITE_OTHER): Payer: Self-pay | Admitting: Family Medicine

## 2023-01-12 ENCOUNTER — Encounter: Payer: Self-pay | Admitting: Family Medicine

## 2023-01-12 VITALS — BP 112/60 | HR 62 | Temp 97.7°F | Ht 70.0 in | Wt 173.4 lb

## 2023-01-12 DIAGNOSIS — I700 Atherosclerosis of aorta: Secondary | ICD-10-CM

## 2023-01-12 DIAGNOSIS — E785 Hyperlipidemia, unspecified: Secondary | ICD-10-CM

## 2023-01-12 DIAGNOSIS — I480 Paroxysmal atrial fibrillation: Secondary | ICD-10-CM

## 2023-01-12 DIAGNOSIS — I10 Essential (primary) hypertension: Secondary | ICD-10-CM

## 2023-01-12 DIAGNOSIS — Z0000 Encounter for general adult medical examination without abnormal findings: Secondary | ICD-10-CM

## 2023-01-12 DIAGNOSIS — M1A9XX0 Chronic gout, unspecified, without tophus (tophi): Secondary | ICD-10-CM

## 2023-01-12 LAB — CBC WITH DIFFERENTIAL/PLATELET
Basophils Absolute: 0 10*3/uL (ref 0.0–0.1)
Basophils Relative: 0.3 % (ref 0.0–3.0)
Eosinophils Absolute: 0.1 10*3/uL (ref 0.0–0.7)
Eosinophils Relative: 1.1 % (ref 0.0–5.0)
HCT: 51.6 % (ref 39.0–52.0)
Hemoglobin: 17.2 g/dL — ABNORMAL HIGH (ref 13.0–17.0)
Lymphocytes Relative: 20.1 % (ref 12.0–46.0)
Lymphs Abs: 1.4 10*3/uL (ref 0.7–4.0)
MCHC: 33.3 g/dL (ref 30.0–36.0)
MCV: 96.8 fL (ref 78.0–100.0)
Monocytes Absolute: 0.6 10*3/uL (ref 0.1–1.0)
Monocytes Relative: 8.7 % (ref 3.0–12.0)
Neutro Abs: 4.9 10*3/uL (ref 1.4–7.7)
Neutrophils Relative %: 69.8 % (ref 43.0–77.0)
Platelets: 175 10*3/uL (ref 150.0–400.0)
RBC: 5.33 Mil/uL (ref 4.22–5.81)
RDW: 13.2 % (ref 11.5–15.5)
WBC: 7.1 10*3/uL (ref 4.0–10.5)

## 2023-01-12 LAB — COMPREHENSIVE METABOLIC PANEL
ALT: 13 U/L (ref 0–53)
AST: 15 U/L (ref 0–37)
Albumin: 4 g/dL (ref 3.5–5.2)
Alkaline Phosphatase: 42 U/L (ref 39–117)
BUN: 22 mg/dL (ref 6–23)
CO2: 30 meq/L (ref 19–32)
Calcium: 9 mg/dL (ref 8.4–10.5)
Chloride: 104 meq/L (ref 96–112)
Creatinine, Ser: 0.99 mg/dL (ref 0.40–1.50)
GFR: 69.71 mL/min (ref >60.00)
Glucose, Bld: 96 mg/dL (ref 70–99)
Potassium: 4.5 meq/L (ref 3.5–5.1)
Sodium: 141 meq/L (ref 135–145)
Total Bilirubin: 0.6 mg/dL (ref 0.2–1.2)
Total Protein: 6.5 g/dL (ref 6.0–8.3)

## 2023-01-12 LAB — LIPID PANEL
Cholesterol: 167 mg/dL (ref 0–200)
HDL: 50 mg/dL (ref >39.00)
LDL Cholesterol: 84 mg/dL (ref 0–99)
NonHDL: 117.3
Total CHOL/HDL Ratio: 3
Triglycerides: 169 mg/dL — ABNORMAL HIGH (ref 0.0–149.0)
VLDL: 33.8 mg/dL (ref 0.0–40.0)

## 2023-01-12 MED ORDER — ATENOLOL 50 MG PO TABS: 50.0000 mg | ORAL_TABLET | Freq: Every day | ORAL | 3 refills | Status: DC

## 2023-01-12 NOTE — Patient Instructions (Addendum)
Please stop by lab before you go If you have mychart- we will send your results within 3 business days of [place] receiving them.  If you do not have mychart- we will call you about results within 5 business days of [place] receiving them.  *please also note that you will see labs on mychart as soon as they post. I will later go in and write notes on them- will say "notes from Dr. Durene Cal"   opted to decrease to atenolol 50 mg just once a day- he should let us know if blood pressure above 135/85 at home regularly  Recommended follow up: Return in about 1 year (around 01/12/2024) for physical or sooner if needed.Schedule b4 you leave.

## 2023-01-12 NOTE — Progress Notes (Signed)
Phone: [phone]   Subjective:  Patient presents today for their annual physical but he is self pay- ok with me billing physical. Chief complaint-noted.   See problem oriented charting- ROS- full  review of systems was completed and negative  Per full ROS sheet completed by patient  The following were reviewed and entered/updated in epic: Past Medical History:  Diagnosis Date   Anxiety    Colon polyp    COLONIC POLYPS, ADENOMATOUS, HX OF 11/11/2003   Annotation: COLONOSCOPY 10/05 ([facility]) COLONOSCOPY 04/11/07 Qualifier: Diagnosis of  By: Leone Payor MD, Alfonse Ras E    Diverticulosis    Gout    Hx of adenomatous colonic polyps 01/12/2015   Hyperlipidemia    Hypertension    PAF (paroxysmal atrial fibrillation) (HCC)    a. diagnosed in 12/2015 b. started on Eliquis for anticoagulation   Patient Active Problem List   Diagnosis Date Noted   Syncope 01/06/2016    Priority: High   PAF (paroxysmal atrial fibrillation) (HCC) 12/10/2015    Priority: High   History of paroxysmal supraventricular tachycardia 12/03/2015    Priority: High   Multiple rib fractures 11/30/2015    Priority: Medium    Hyperlipidemia 10/25/2006    Priority: Medium    Gout 10/25/2006    Priority: Medium    Essential hypertension 10/25/2006    Priority: Medium    History of skin cancer 06/13/2017    Priority: Low   Hx of adenomatous colonic polyps 01/12/2015    Priority: Low   Generalized anxiety disorder 11/27/2007    Priority: Low   Aortic atherosclerosis (HCC) 12/01/2016   Sternoclavicular (joint) (ligament) sprain, right, sequela 09/26/2016   Scapular dyskinesis 09/26/2016   Rotator cuff syndrome of right shoulder 09/26/2016   Past Surgical History:  Procedure Laterality Date   COLONOSCOPY     INGUINAL HERNIA REPAIR      Family History  Problem Relation Age of Onset   Cancer Mother        lung   Colon cancer Neg Hx     Medications- reviewed and updated Current Outpatient Medications   Medication Sig Dispense Refill   atenolol (TENORMIN) 50 MG tablet Take 1 tablet (50 mg total) by mouth daily. 90 tablet 3   Wheat Dextrin (BENEFIBER DRINK MIX PO) Take by mouth daily.     No current facility-administered medications for this visit.    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 Oropharynx normal Neck: no thyromegaly CV: RRR no murmurs rubs or gallops Lungs: CTAB no crackles, wheeze, rhonchi Abdomen: soft/nontender/nondistended/normal bowel sounds. No rebound or guarding.  Ext: no edema Skin: warm, dry Neuro: grossly normal, moves all extremities, PERRLA   Assessment and Plan  87 y.o. male presenting for annual physical.  Health Maintenance counseling: 1. Anticipatory guidance: Patient counseled regarding regular dental exams -q6 months, eye exams - no issues- declines visit,  avoiding smoking and second hand smoke , limiting alcohol to 2 beverages per day -1.5 oz scotch a day or less, no illicit drugs .  2. Risk factor reduction:  Advised patient of need for regular exercise and diet rich and fruits and vegetables to reduce risk of heart attack and stroke.  Exercise- remains active at the home- no specific exercises- has over acre that he maintains though.  Diet/weight management-weight up 7 lbs in last year- till healthy weight. Overall had lost weight due to poor taste after dental work issues Wt Readings from Last 3 Encounters:  01/12/23 173 lb 6.4 oz (78.7 kg)  01/10/22 166 lb 3.2 oz (75.4 kg)  11/25/20 168 lb (76.2 kg)  3.  Immunizations/screenings/ancillary studies- declines shingrix, RSV, and COVID for now  Immunization History  Administered Date(s) Administered   Fluad Quad(high Dose 65+) 09/24/2018, 10/14/2019   Influenza Split 01/05/2011   Influenza Whole 11/27/2007, 11/04/2008, 11/06/2009   Influenza, High Dose Seasonal PF 11/07/2012, 12/01/2016, 10/03/2017, 11/14/2021, 01/01/2023   Influenza,inj,Quad PF,6+ Mos 11/05/2013, 11/03/2014, 12/01/2015   PFIZER Comirnaty(Gray Top)Covid-19 Tri-Sucrose Vaccine 11/14/2021   PFIZER(Purple Top)SARS-COV-2 Vaccination 03/14/2019, 04/06/2019, 12/16/2019   Pneumococcal Conjugate-13 06/18/2014   Pneumococcal Polysaccharide-23 06/12/2007   Td 06/12/2007   Tdap 02/26/2019   Zoster Recombinant(Shingrix) 06/09/2022, 08/07/2022   Zoster, Live 06/12/2007   4. Prostate cancer screening- past age based screening recommendations. Nocturia twice a night last year and now up to 3x a night   5. Colon cancer screening - 2016 with Dr. Leone Payor and past age based screening recommendations  6. Skin cancer screening-  Dr. Karlyn Agee with family histoyr melanoma in father. advised regular sunscreen use. Denies worrisome, changing, or new skin lesions.  7. Smoking associated screening (lung cancer screening, AAA screen 65-75, UA)- former smoker- quit in 1970s. No abdominal aortic aneurysm on CT abdomen/pelvis 11/2015 8. STD screening - declines- have recommended condoms if active outside of marriage  Status of chronic or acute concerns   #hypertension S: medication: atenolol 50 mg twice daily (blood pressure has trended down with age and health lifestyle) -prior amlodipine 2.5 mg BP Readings from Last 3 Encounters:  01/12/23 112/60  01/10/22 122/60  11/25/20 110/70  A/P: blood pressure well controlled - in fact perhaps mild overcontrol- opted to decrease to atenolol 50 mg just once a day- he should let us know if blood pressure above 135/85 at home regularly  #hyperlipidemia #aortic  atherosclerosis- goal LDL under 70 S: Medication: none  - prior rosuvastatin 10 mg once a week  A/P: update lipids today- he wants to remain off of medicine at his age through  #Gout S: medication: none -prior allopurinol 100 mg - no gout flares recently Lab Results  Component Value Date   LABURIC 5.1 01/10/2022  A/P:doing well - wants me to hold off on checking uric acid - looked good last year  # Atrial fibrillation- no recurrence since fall from ladder 2017. Still on atenolol for rate control if he goes back into a fib. No aspirin/coumadin/etc. No palpations etc. 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  Lab/Order associations:NOT fasting   ICD-10-CM   1. Preventative health care  Z00.00     2. Essential hypertension  I10     3. Hyperlipidemia, unspecified hyperlipidemia type  E78.5     4. PAF (paroxysmal atrial fibrillation) (HCC)  I48.0     5. Aortic atherosclerosis (HCC)  I70.0     6. Chronic gout without tophus, unspecified cause, unspecified site  [id number]       Meds ordered this encounter  Medications   atenolol (TENORMIN) 50 MG tablet    Sig: Take 1 tablet (50 mg total) by mouth daily.    Dispense:  90 tablet    Refill:  3    Return precautions advised.  Tana Conch, MD

## 2023-01-30 ENCOUNTER — Telehealth: Payer: Self-pay | Admitting: Family Medicine

## 2023-01-30 NOTE — Telephone Encounter (Unsigned)
Copied from CRM [id number]. Topic: Clinical - Lab/Test Results >> Jan 30, 2023  3:53 PM Leavy Cella D wrote: Reason for CRM: Patient is calling in regards to lab/physical he had done on 01/26/23. Please call patient back when available

## 2023-01-31 ENCOUNTER — Telehealth: Payer: Self-pay | Admitting: Family Medicine

## 2023-01-31 NOTE — Telephone Encounter (Signed)
Copied from CRM [id number]. Topic: Clinical - Lab/Test Results >> Jan 30, 2023  2:49 PM Travis Pruitt wrote: Reason for CRM: Pt is requesting a callback regarding not receiving his results from his last physical for a blood and urine test.

## 2023-02-02 ENCOUNTER — Telehealth: Payer: Self-pay

## 2023-02-02 NOTE — Telephone Encounter (Signed)
 Called and spoke with pt and results reviewed and mailed to his address.  Copied from CRM [id number]. Topic: General - Other >> Feb 02, 2023  9:18 AM Thersia BROCKS wrote: Reason for CRM: Patient would like his physical results sent to him by email. [email or web address]

## 2023-02-02 NOTE — Telephone Encounter (Signed)
 Called and spoke with pt and labs reviewed/mailed per pt request.

## 2023-02-02 NOTE — Telephone Encounter (Signed)
 Called and spoke with pt and labs reviewed and mailed per pt request.

## 2023-02-03 NOTE — Telephone Encounter (Signed)
 Copied from CRM [id number]. Topic: Clinical - Request for Lab/Test Order >> Feb 03, 2023  9:30 AM Rolin D wrote: Reason for CRM: Patient called stating that he request his physical results that he had done 2 weeks ago to be emailed to him . Still have not received the email . Patient stated he does not have access to Mychart. >> Feb 03, 2023 10:52 AM Tammy P wrote: Mailing labs.  I have spoken to patient in regard.

## 2023-03-15 ENCOUNTER — Ambulatory Visit: Payer: Self-pay | Admitting: Family Medicine

## 2023-03-15 NOTE — Telephone Encounter (Signed)
At time of last visit he was taking atenolol 50 mg twice daily and we reduced to 50 mg once daily.  Please confirm he is taking 50 mg once daily.  If he is I would be okay with him seeing me in about 2 weeks and simply going back to 50 mg twice daily-send in prescription if needed -Can cancel visit with [facility] tomorrow if he is okay with this

## 2023-03-15 NOTE — Telephone Encounter (Addendum)
Patient returned call. This RN notified patient of Dr. Erasmo Leventhal response. Patient confirmed that he is taking Atenolol 50 mg once daily. Patient is okay with the plan that Dr. Durene Cal suggested and he will go back to taking 50 mg twice daily. Patient stated he does not need a new prescription. The appointment for tomorrow has been cancelled.   This RN offered to schedule appointment with Dr. Durene Cal on Feb 25, but patient declined due to conflict with another appt he has on that day. The next available day on Dr. Erasmo Leventhal schedule was 3/4. Appointment scheduled.

## 2023-03-15 NOTE — Telephone Encounter (Addendum)
Encounter made by error

## 2023-03-15 NOTE — Telephone Encounter (Signed)
Tried to call pt again, please verify with pt if he is taking Atenolol 50mg  or not, pt's mb is full

## 2023-03-15 NOTE — Telephone Encounter (Signed)
Duplicate Telephone Encounter See previous encounter

## 2023-03-15 NOTE — Telephone Encounter (Signed)
Pt's mb full - tried to call the pt

## 2023-03-15 NOTE — Telephone Encounter (Signed)
  Chief Complaint: hypertension Symptoms: asymptomatic Frequency: 10 days Pertinent Negatives: Patient denies CP, SOB, visual changes Disposition: [] ED /[] Urgent Care (no appt availability in office) / [x] Appointment(In office/virtual)/ []  [facility]/ [] Home Care/ [] Refused Recommended Disposition /[] [facility]/ []  Follow-up with PCP Additional Notes: Patient calls reporting high blood pressure reading for ten days after BP med decreased. States he has been monitoring at home and wanted to follow up for medication adjustment. Per protocol, patient to be evaluated within 3 days. First available appointment with PCP 03/28/23. Patient scheduled with alternate provider in clinic for 03/16/23 @ 1120 at patient request. Care advice reviewed, patient verbalized understandingand denies further questions at this time. Alerting PCP for review.    Copied from CRM [id number]. Topic: Clinical - Red Word Triage >> Mar 15, 2023 12:03 PM Adele Barthel wrote: Red Word that prompted transfer to Nurse Triage:   Patient is calling in to report elevated blood pressure readings since reducing his dosage of atenolol (TENORMIN) 50 MG tablet in half. Dosage was reduced in 01/2023 at last visit with provider.  170/96 168/70, this morning at 11:30 am  No headaches or chest pain. Reason for Disposition  Systolic BP  >= 160 OR Diastolic >= 100  Answer Assessment - Initial Assessment Questions 1. BLOOD PRESSURE: "What is the blood pressure?" "Did you take at least two measurements 5 minutes apart?"     170/96 and 168/70  2. ONSET: "When did you take your blood pressure?"     this morning at 1130 3. HOW: "How did you take your blood pressure?" (e.g., automatic home BP monitor, visiting nurse)     Home machine, states it has been reading higher for about ten days. 4. HISTORY: "Do you have a history of high blood pressure?"     Yes 5. MEDICINES: "Are you taking any medicines for blood pressure?" "Have  you missed any doses recently?"     atenolol (TENORMIN) 50 MG, no missed doses 6. OTHER SYMPTOMS: "Do you have any symptoms?" (e.g., blurred vision, chest pain, difficulty breathing, headache, weakness)     Denies  Protocols used: Blood Pressure - High-A-AH

## 2023-03-15 NOTE — Telephone Encounter (Signed)
Copied from CRM [id number]. Topic: General - Other >> Mar 15, 2023  2:25 PM Brandy W wrote: Reason for CRM: patient returning missed call from Samara Deist, CMA in regards to his previous nurse triage call, per the notes  "Dr. Durene Cal is asking clarification on if he is taking  atenolol 50 mg once daily, if he is provider would like to see him in 2 weeks and go back to 50 mg twice daily- send  in prescription if needed"

## 2023-03-15 NOTE — Telephone Encounter (Signed)
Noted

## 2023-03-16 ENCOUNTER — Ambulatory Visit: Payer: Self-pay | Admitting: Physician Assistant

## 2023-04-04 ENCOUNTER — Ambulatory Visit: Payer: Self-pay | Admitting: Family Medicine

## 2023-04-20 ENCOUNTER — Ambulatory Visit (INDEPENDENT_AMBULATORY_CARE_PROVIDER_SITE_OTHER): Payer: Self-pay | Admitting: Family Medicine

## 2023-04-20 ENCOUNTER — Encounter: Payer: Self-pay | Admitting: Family Medicine

## 2023-04-20 VITALS — BP 126/72 | HR 64 | Temp 98.1°F | Ht 70.0 in | Wt 175.0 lb

## 2023-04-20 DIAGNOSIS — I10 Essential (primary) hypertension: Secondary | ICD-10-CM

## 2023-04-20 DIAGNOSIS — I480 Paroxysmal atrial fibrillation: Secondary | ICD-10-CM

## 2023-04-20 DIAGNOSIS — I700 Atherosclerosis of aorta: Secondary | ICD-10-CM

## 2023-04-20 DIAGNOSIS — E785 Hyperlipidemia, unspecified: Secondary | ICD-10-CM

## 2023-04-20 NOTE — Progress Notes (Signed)
 Phone [phone] In person visit   Subjective:   Travis Pruitt is a 88 y.o. year old very pleasant male patient who presents for/with See problem oriented charting Chief Complaint  Patient presents with   Hypertension    Pt was dentist the other day and it was 140/90 and last his bp meds were reduced and he noticed an increase since then.   Past Medical History-  Patient Active Problem List   Diagnosis Date Noted   Syncope 01/06/2016    Priority: High   PAF (paroxysmal atrial fibrillation) (HCC) 12/10/2015    Priority: High   History of paroxysmal supraventricular tachycardia 12/03/2015    Priority: High   Multiple rib fractures 11/30/2015    Priority: Medium    Hyperlipidemia 10/25/2006    Priority: Medium    Gout 10/25/2006    Priority: Medium    Essential hypertension 10/25/2006    Priority: Medium    History of skin cancer 06/13/2017    Priority: Low   Hx of adenomatous colonic polyps 01/12/2015    Priority: Low   Generalized anxiety disorder 11/27/2007    Priority: Low   Aortic atherosclerosis (HCC) 12/01/2016   Sternoclavicular (joint) (ligament) sprain, right, sequela 09/26/2016   Scapular dyskinesis 09/26/2016   Rotator cuff syndrome of right shoulder 09/26/2016    Medications- reviewed and updated Current Outpatient Medications  Medication Sig Dispense Refill   atenolol (TENORMIN) 50 MG tablet Take 1 tablet (50 mg total) by mouth daily. 90 tablet 3   Wheat Dextrin (BENEFIBER DRINK MIX PO) Take by mouth daily.     No current facility-administered medications for this visit.     Objective:  BP 126/72   Pulse 64   Temp 98.1 F (36.7 C)   Ht 5\' 10"  (1.778 m)   Wt 175 lb (79.4 kg)   SpO2 (!) 9%   BMI 25.11 kg/m  Gen: NAD, resting comfortably CV: RRR (no obvious a fib but cannot rule out without EKG) no murmurs rubs or gallops Lungs: CTAB no crackles, wheeze, rhonchi Ext: minimal edema Skin: warm, dry     Assessment and Plan    #hypertension S: medication: atenolol 50 mg daily (blood pressure has trended down with age and health lifestyle). Denies any orthostatic symptoms on the lower dose but also trying to rise more slowly - blood pressure reported blood pressure up to 140/90  -prior amlodipine 2.5 mg and atenolol 50 mg twice day Home readings #s: no recent checks BP Readings from Last 3 Encounters:  04/20/23 126/72  01/12/23 112/60  01/10/22 122/60  A/P: blood pressure looks great x2 in office today even with prior reduction in blood pressure medicine- we opted to continue the lower dose of atenolol- continue current medications   #hyperlipidemia #aortic atherosclerosis- goal LDL under 70 S: Medication: none  - prior rosuvastatin 10 mg once a week Lab Results  Component Value Date   CHOL 167 01/12/2023   HDL 50.00 01/12/2023   LDLCALC 84 01/12/2023   TRIG 169.0 (H) 01/12/2023   CHOLHDL 3 01/12/2023   A/P: aortic atherosclerosis (presumed stable)- LDL goal ideally <70 - just a hair above goal but he wanst to remain off of medicine despite risks  # Atrial fibrillation- no recurrence since fall from ladder 2017 once again. Still on atenolol for rate control if he goes back into a fib but that has not occurred that we can tell. No aspirin/coumadin/etc  - encouraged to avoid falls or ladders for sure  Recommended  follow up: Return for next already scheduled visit or sooner if needed. Future Appointments  Date Time Provider Department Center  08/01/2023 11:00 AM Shelva Majestic, MD [facility]    Lab/Order associations:   ICD-10-CM   1. Essential hypertension  I10     2. Hyperlipidemia, unspecified hyperlipidemia type  E78.5     3. PAF (paroxysmal atrial fibrillation) (HCC) Chronic I48.0     4. Aortic atherosclerosis (HCC)  I70.0       No orders of the defined types were placed in this encounter.   Return precautions advised.  Tana Conch, MD

## 2023-04-20 NOTE — Patient Instructions (Addendum)
 blood pressure looks great x2 in office today even with prior reduction in blood pressure medicine- we opted to continue the lower dose of atenolol- continue current medications   Glad you are doing well overall   Recommended follow up: Return for next already scheduled visit or sooner if needed.

## 2023-08-01 ENCOUNTER — Encounter: Payer: Self-pay | Admitting: Family Medicine

## 2023-10-31 ENCOUNTER — Encounter: Payer: Self-pay | Admitting: Family Medicine

## 2023-11-23 ENCOUNTER — Encounter: Payer: Self-pay | Admitting: Family Medicine

## 2023-11-23 ENCOUNTER — Ambulatory Visit (INDEPENDENT_AMBULATORY_CARE_PROVIDER_SITE_OTHER): Payer: Self-pay | Admitting: Family Medicine

## 2023-11-23 VITALS — BP 122/78 | HR 62 | Temp 98.2°F | Ht 70.0 in | Wt 170.4 lb

## 2023-11-23 DIAGNOSIS — E785 Hyperlipidemia, unspecified: Secondary | ICD-10-CM

## 2023-11-23 DIAGNOSIS — Z0000 Encounter for general adult medical examination without abnormal findings: Secondary | ICD-10-CM

## 2023-11-23 DIAGNOSIS — I10 Essential (primary) hypertension: Secondary | ICD-10-CM

## 2023-11-23 DIAGNOSIS — I480 Paroxysmal atrial fibrillation: Secondary | ICD-10-CM

## 2023-11-23 NOTE — Progress Notes (Signed)
 Phone: [phone]   Subjective:  Patient presents today for their annual physical. Chief complaint-noted.   See problem oriented charting- ROS- full  review of systems was completed and negative  except for topics noted under acute/chronic concerns  The following were reviewed and entered/updated in epic: Past Medical History:  Diagnosis Date   Anxiety    Colon polyp    COLONIC POLYPS, ADENOMATOUS, HX OF 11/11/2003   Annotation: COLONOSCOPY 10/05 ([facility]) COLONOSCOPY 04/11/07 Qualifier: Diagnosis of  By: Avram MD, NOLIA Pitts E    Diverticulosis    Gout    Hx of adenomatous colonic polyps 01/12/2015   Hyperlipidemia    Hypertension    PAF (paroxysmal atrial fibrillation) (HCC)    a. diagnosed in 12/2015 b. started on Eliquis  for anticoagulation   Patient Active Problem List   Diagnosis Date Noted   Syncope 01/06/2016    Priority: High   PAF (paroxysmal atrial fibrillation) (HCC) 12/10/2015    Priority: High   History of paroxysmal supraventricular tachycardia 12/03/2015    Priority: High   Multiple rib fractures 11/30/2015    Priority: Medium    Hyperlipidemia 10/25/2006    Priority: Medium    Gout 10/25/2006    Priority: Medium    Essential hypertension 10/25/2006    Priority: Medium    History of skin cancer 06/13/2017    Priority: Low   Hx of adenomatous colonic polyps 01/12/2015    Priority: Low   Generalized anxiety disorder 11/27/2007    Priority: Low   Aortic atherosclerosis 12/01/2016   Sternoclavicular (joint) (ligament) sprain, right, sequela 09/26/2016   Scapular dyskinesis 09/26/2016   Rotator cuff syndrome of right shoulder 09/26/2016   Past Surgical History:  Procedure Laterality Date   COLONOSCOPY     INGUINAL HERNIA REPAIR      Family History  Problem Relation Age of Onset   Cancer Mother        lung   Colon cancer Neg Hx     Medications- reviewed and updated Current Outpatient Medications  Medication Sig Dispense Refill    atenolol  (TENORMIN ) 50 MG tablet Take 1 tablet (50 mg total) by mouth daily. 90 tablet 3   Wheat Dextrin (BENEFIBER DRINK MIX PO) Take by mouth daily.     No current facility-administered medications for this visit.    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 Oropharynx normal Neck: no thyromegaly CV: RRR no murmurs rubs or gallops Lungs: CTAB no crackles, wheeze, rhonchi Abdomen: soft/nontender/nondistended/normal bowel sounds. No rebound or guarding.  Ext: no edema Skin: warm, dry Neuro: grossly normal, moves all extremities, PERRLA    Assessment and Plan  88 y.o. male presenting for annual physical.  Health Maintenance counseling: 1. Anticipatory guidance: Patient counseled regarding regular dental exams -q6 months, eye exams - no issues- encouraged at least intermittent checks,  avoiding smoking and second hand smoke , limiting alcohol to 2 beverages per  day - 1.5 oz of scotch per night, no illicit drugs .   2. Risk factor reduction:  Advised patient of need for regular exercise and diet rich and fruits and vegetables to reduce risk of heart attack and stroke.  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  Diet/weight management-states with no taste after dental work has to eat to live primarily but no joy in it.  Wt Readings from Last 3 Encounters:  11/23/23 170 lb 6.4 oz (77.3 kg)  04/20/23 175 lb (79.4 kg)  01/12/23 173 lb 6.4 oz (78.7 kg)  3.  Immunizations/screenings/ancillary studies- already had flu shot, holding off on COVID  Immunization History  Administered Date(s) Administered   Fluad Quad(high Dose 65+) 09/24/2018, 10/14/2019, 11/08/2023   INFLUENZA, HIGH DOSE SEASONAL PF 11/07/2012, 12/01/2016, 10/03/2017, 11/14/2021, 01/01/2023   Influenza Split 01/05/2011   Influenza Whole 11/27/2007, 11/04/2008, 11/06/2009   Influenza,inj,Quad PF,6+ Mos 11/05/2013, 11/03/2014, 12/01/2015   PFIZER Comirnaty(Gray Top)Covid-19 Tri-Sucrose Vaccine 11/14/2021   PFIZER(Purple Top)SARS-COV-2 Vaccination 03/14/2019, 04/06/2019, 12/16/2019   Pneumococcal Conjugate-13 06/18/2014   Pneumococcal Polysaccharide-23 06/12/2007   Td 06/12/2007   Tdap 02/26/2019   Zoster Recombinant(Shingrix) 06/09/2022, 08/07/2022   Zoster, Live 06/12/2007  4. Prostate cancer screening-  past age based screening recommendations  5. Colon cancer screening -  past age based screening recommendations - no rectal bleeding or melena 6. Skin cancer screening- sees dermatology. advised regular sunscreen use. Denies worrisome, changing, or new skin lesions.  7. Smoking associated screening (lung cancer screening, AAA screen 65-75, UA)- former smoker- quit in 1970- no abdominal aortic aneurysm on CT abdomen/pelvis 11/2015- no regular screening otherwise 8. STD screening -  declines.   Status of chronic or acute concerns   #after dental work lost ability to taste - has to force himself to eats  #hypertension S: medication: atenolol  50 mg daily (blood pressure has trended down with age and health lifestyle) BP Readings from Last 3 Encounters:  11/23/23 122/78  04/20/23 126/72  01/12/23 112/60  A/P: well controlled continue current medications   #hyperlipidemia #aortic atherosclerosis- goal LDL under 70 S: Medication: none  - prior rosuvastatin  10 mg once a week Lab Results  Component Value Date   CHOL 167 01/12/2023   HDL 50.00 01/12/2023   LDLCALC 84  01/12/2023   TRIG 169.0 (H) 01/12/2023   CHOLHDL 3 01/12/2023  A/P: imperfect control but at his age prefers to stay off medicine and limited data on primary prevention available for benefit at his age- though does have aortic atherosclerosis which does increase risk some  #Gout- no issues in several years even off medicine. Holding off on uric acid with no flares  # Atrial fibrillation- no recurrence since fall from [facility] 2017. Still on atenolol  for rate control if he goes back into a fib. 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  Lab/Order associations:NOT fasting- offered CBC, CMP, lipid panel he declines   ICD-10-CM   1. Preventative health care  Z00.00     2. Essential hypertension  I10  3. Hyperlipidemia, unspecified hyperlipidemia type  E78.5     4. PAF (paroxysmal atrial fibrillation) (HCC)  I48.0       No orders of the defined types were placed in this encounter.   Return precautions advised.  Garnette Lukes, MD

## 2023-11-23 NOTE — Patient Instructions (Addendum)
 You opted for labs next visit  Glad you are doing so well!  Recommended follow up: Return in about 6 months (around 05/23/2024) for followup or sooner if needed.Schedule b4 you leave.

## 2024-01-26 ENCOUNTER — Other Ambulatory Visit: Payer: Self-pay | Admitting: Family Medicine

## 2024-05-28 ENCOUNTER — Ambulatory Visit: Payer: Self-pay | Admitting: Family Medicine

## 2024-11-26 ENCOUNTER — Encounter: Payer: Self-pay | Admitting: Family Medicine
